# Patient Record
Sex: Female | Born: 1965 | Race: White | Hispanic: No | Smoking: Former smoker
Health system: Southern US, Community
[De-identification: ages and names within clinical notes are randomized; demographics above are authoritative.]

## PROBLEM LIST (undated history)

## (undated) DIAGNOSIS — M341 CR(E)ST syndrome: Secondary | ICD-10-CM

## (undated) DIAGNOSIS — J302 Other seasonal allergic rhinitis: Secondary | ICD-10-CM

## (undated) DIAGNOSIS — Z86010 Personal history of colonic polyps: Secondary | ICD-10-CM

## (undated) DIAGNOSIS — M47812 Spondylosis without myelopathy or radiculopathy, cervical region: Secondary | ICD-10-CM

## (undated) DIAGNOSIS — K222 Esophageal obstruction: Secondary | ICD-10-CM

## (undated) DIAGNOSIS — I7300 Raynaud's syndrome without gangrene: Secondary | ICD-10-CM

## (undated) DIAGNOSIS — K219 Gastro-esophageal reflux disease without esophagitis: Secondary | ICD-10-CM

## (undated) DIAGNOSIS — K589 Irritable bowel syndrome without diarrhea: Secondary | ICD-10-CM

## (undated) DIAGNOSIS — Z720 Tobacco use: Secondary | ICD-10-CM

## (undated) DIAGNOSIS — E538 Deficiency of other specified B group vitamins: Secondary | ICD-10-CM

## (undated) DIAGNOSIS — B009 Herpesviral infection, unspecified: Secondary | ICD-10-CM

## (undated) DIAGNOSIS — M4800 Spinal stenosis, site unspecified: Secondary | ICD-10-CM

## (undated) DIAGNOSIS — I10 Essential (primary) hypertension: Secondary | ICD-10-CM

## (undated) HISTORY — DX: Raynaud's syndrome without gangrene: I73.00

## (undated) HISTORY — DX: Tobacco use: Z72.0

## (undated) HISTORY — DX: Cr(e)st syndrome: M34.1

## (undated) HISTORY — DX: Gastro-esophageal reflux disease without esophagitis: K21.9

## (undated) HISTORY — PX: HEMORRHOID SURGERY: SHX153

## (undated) HISTORY — DX: Irritable bowel syndrome, unspecified: K58.9

## (undated) HISTORY — PX: COLONOSCOPY: SHX174

## (undated) HISTORY — PX: OOPHORECTOMY: SHX86

## (undated) HISTORY — DX: Personal history of colonic polyps: Z86.010

## (undated) HISTORY — PX: DERMOID CYST  EXCISION: SHX1452

## (undated) HISTORY — DX: Spondylosis without myelopathy or radiculopathy, cervical region: M47.812

## (undated) HISTORY — DX: Essential (primary) hypertension: I10

## (undated) HISTORY — PX: BREAST SURGERY: SHX581

## (undated) HISTORY — PX: CERVICAL FUSION: SHX112

## (undated) HISTORY — DX: Esophageal obstruction: K22.2

---
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
## 1998-04-23 ENCOUNTER — Emergency Department (HOSPITAL_COMMUNITY): Admission: EM | Admit: 1998-04-23 | Discharge: 1998-04-23 | Payer: Self-pay | Admitting: Emergency Medicine

## 1999-11-10 ENCOUNTER — Other Ambulatory Visit: Admission: RE | Admit: 1999-11-10 | Discharge: 1999-11-10 | Payer: Self-pay | Admitting: *Deleted

## 2000-08-19 ENCOUNTER — Encounter: Payer: Self-pay | Admitting: Internal Medicine

## 2000-08-19 ENCOUNTER — Encounter: Admission: RE | Admit: 2000-08-19 | Discharge: 2000-08-19 | Payer: Self-pay | Admitting: Internal Medicine

## 2003-08-21 ENCOUNTER — Ambulatory Visit (HOSPITAL_COMMUNITY): Admission: RE | Admit: 2003-08-21 | Discharge: 2003-08-21 | Payer: Self-pay | Admitting: Endocrinology

## 2004-07-24 ENCOUNTER — Encounter: Admission: RE | Admit: 2004-07-24 | Discharge: 2004-07-24 | Payer: Self-pay | Admitting: Obstetrics and Gynecology

## 2004-09-11 ENCOUNTER — Ambulatory Visit (HOSPITAL_COMMUNITY): Admission: RE | Admit: 2004-09-11 | Discharge: 2004-09-11 | Payer: Self-pay | Admitting: Gastroenterology

## 2004-09-11 ENCOUNTER — Encounter (INDEPENDENT_AMBULATORY_CARE_PROVIDER_SITE_OTHER): Payer: Self-pay | Admitting: Specialist

## 2005-01-14 ENCOUNTER — Encounter: Admission: RE | Admit: 2005-01-14 | Discharge: 2005-01-14 | Payer: Self-pay | Admitting: Obstetrics and Gynecology

## 2005-02-22 ENCOUNTER — Encounter (INDEPENDENT_AMBULATORY_CARE_PROVIDER_SITE_OTHER): Payer: Self-pay | Admitting: Specialist

## 2005-02-22 ENCOUNTER — Ambulatory Visit (HOSPITAL_COMMUNITY): Admission: RE | Admit: 2005-02-22 | Discharge: 2005-02-22 | Payer: Self-pay | Admitting: General Surgery

## 2005-09-06 ENCOUNTER — Encounter (INDEPENDENT_AMBULATORY_CARE_PROVIDER_SITE_OTHER): Payer: Self-pay | Admitting: Specialist

## 2005-09-06 ENCOUNTER — Ambulatory Visit (HOSPITAL_BASED_OUTPATIENT_CLINIC_OR_DEPARTMENT_OTHER): Admission: RE | Admit: 2005-09-06 | Discharge: 2005-09-06 | Payer: Self-pay | Admitting: General Surgery

## 2005-11-11 ENCOUNTER — Encounter (INDEPENDENT_AMBULATORY_CARE_PROVIDER_SITE_OTHER): Payer: Self-pay | Admitting: *Deleted

## 2005-11-11 ENCOUNTER — Ambulatory Visit (HOSPITAL_COMMUNITY): Admission: RE | Admit: 2005-11-11 | Discharge: 2005-11-12 | Payer: Self-pay | Admitting: Obstetrics and Gynecology

## 2005-12-03 ENCOUNTER — Emergency Department (HOSPITAL_COMMUNITY): Admission: EM | Admit: 2005-12-03 | Discharge: 2005-12-03 | Payer: Self-pay | Admitting: *Deleted

## 2006-08-08 ENCOUNTER — Encounter: Admission: RE | Admit: 2006-08-08 | Discharge: 2006-08-08 | Payer: Self-pay | Admitting: Obstetrics and Gynecology

## 2006-09-06 ENCOUNTER — Encounter (INDEPENDENT_AMBULATORY_CARE_PROVIDER_SITE_OTHER): Payer: Self-pay | Admitting: General Surgery

## 2006-09-06 ENCOUNTER — Ambulatory Visit (HOSPITAL_BASED_OUTPATIENT_CLINIC_OR_DEPARTMENT_OTHER): Admission: RE | Admit: 2006-09-06 | Discharge: 2006-09-06 | Payer: Self-pay | Admitting: General Surgery

## 2008-01-04 LAB — CONVERTED CEMR LAB

## 2008-01-30 ENCOUNTER — Encounter: Admission: RE | Admit: 2008-01-30 | Discharge: 2008-01-30 | Payer: Self-pay | Admitting: Obstetrics and Gynecology

## 2008-02-12 ENCOUNTER — Ambulatory Visit: Payer: Self-pay | Admitting: Internal Medicine

## 2008-02-12 DIAGNOSIS — I10 Essential (primary) hypertension: Secondary | ICD-10-CM | POA: Insufficient documentation

## 2008-02-12 LAB — CONVERTED CEMR LAB
AST: 36 units/L (ref 0–37)
Albumin: 4.2 g/dL (ref 3.5–5.2)
Alkaline Phosphatase: 47 units/L (ref 39–117)
Basophils Absolute: 0 10*3/uL (ref 0.0–0.1)
Basophils Relative: 0.1 % (ref 0.0–3.0)
Chloride: 103 meq/L (ref 96–112)
Crystals: NEGATIVE
Eosinophils Absolute: 0 10*3/uL (ref 0.0–0.7)
Eosinophils Relative: 0.6 % (ref 0.0–5.0)
Folate: 4.5 ng/mL
GFR calc Af Amer: 117 mL/min
GFR calc non Af Amer: 97 mL/min
Ketones, ur: 15 mg/dL — AB
Leukocytes, UA: NEGATIVE
Lymphocytes Relative: 14.4 % (ref 12.0–46.0)
MCV: 99.4 fL (ref 78.0–100.0)
Monocytes Relative: 1.8 % — ABNORMAL LOW (ref 3.0–12.0)
Neutrophils Relative %: 83.1 % — ABNORMAL HIGH (ref 43.0–77.0)
Nitrite: NEGATIVE
Potassium: 3.5 meq/L (ref 3.5–5.1)
RBC / HPF: NONE SEEN
RBC: 3.87 M/uL (ref 3.87–5.11)
Sodium: 137 meq/L (ref 135–145)
Total Bilirubin: 0.7 mg/dL (ref 0.3–1.2)
Transferrin: 201.6 mg/dL — ABNORMAL LOW (ref >=212.0)
Urine Glucose: >=1000 mg/dL — CR
Vitamin B-12: 147 pg/mL — ABNORMAL LOW (ref 211–911)
WBC: 8.3 10*3/uL (ref 4.5–10.5)

## 2008-02-13 ENCOUNTER — Telehealth (INDEPENDENT_AMBULATORY_CARE_PROVIDER_SITE_OTHER): Payer: Self-pay | Admitting: *Deleted

## 2008-02-14 ENCOUNTER — Encounter: Payer: Self-pay | Admitting: Internal Medicine

## 2008-02-21 ENCOUNTER — Ambulatory Visit: Payer: Self-pay | Admitting: Internal Medicine

## 2008-02-21 DIAGNOSIS — E538 Deficiency of other specified B group vitamins: Secondary | ICD-10-CM | POA: Insufficient documentation

## 2008-02-21 DIAGNOSIS — G63 Polyneuropathy in diseases classified elsewhere: Secondary | ICD-10-CM

## 2008-03-05 ENCOUNTER — Encounter (INDEPENDENT_AMBULATORY_CARE_PROVIDER_SITE_OTHER): Payer: Self-pay | Admitting: General Surgery

## 2008-03-05 ENCOUNTER — Ambulatory Visit (HOSPITAL_BASED_OUTPATIENT_CLINIC_OR_DEPARTMENT_OTHER): Admission: RE | Admit: 2008-03-05 | Discharge: 2008-03-05 | Payer: Self-pay | Admitting: General Surgery

## 2008-03-14 ENCOUNTER — Ambulatory Visit: Payer: Self-pay | Admitting: Internal Medicine

## 2008-03-14 DIAGNOSIS — R739 Hyperglycemia, unspecified: Secondary | ICD-10-CM | POA: Insufficient documentation

## 2008-03-15 ENCOUNTER — Encounter: Payer: Self-pay | Admitting: Internal Medicine

## 2008-07-15 ENCOUNTER — Telehealth: Payer: Self-pay | Admitting: Internal Medicine

## 2008-12-26 ENCOUNTER — Ambulatory Visit: Payer: Self-pay | Admitting: Internal Medicine

## 2008-12-26 LAB — CONVERTED CEMR LAB
Basophils Absolute: 0 10*3/uL (ref 0.0–0.1)
CO2: 31 meq/L (ref 19–32)
Chloride: 98 meq/L (ref 96–112)
Eosinophils Absolute: 0.1 10*3/uL (ref 0.0–0.7)
Eosinophils Relative: 0.8 % (ref 0.0–5.0)
Folate: 8.5 ng/mL
GFR calc non Af Amer: 115.19 mL/min (ref >60)
HCT: 37.1 % (ref 36.0–46.0)
Lymphs Abs: 1.6 10*3/uL (ref 0.7–4.0)
MCHC: 34.4 g/dL (ref 30.0–36.0)
MCV: 104 fL — ABNORMAL HIGH (ref 78.0–100.0)
Neutro Abs: 8.5 10*3/uL — ABNORMAL HIGH (ref 1.4–7.7)
Neutrophils Relative %: 78.4 % — ABNORMAL HIGH (ref 43.0–77.0)
Platelets: 158 10*3/uL (ref 150.0–400.0)
RBC: 3.56 M/uL — ABNORMAL LOW (ref 3.87–5.11)
Sodium: 137 meq/L (ref 135–145)
Transferrin: 219.3 mg/dL (ref 212.0–360.0)
Vitamin B-12: 301 pg/mL (ref 211–911)

## 2009-01-05 ENCOUNTER — Encounter: Payer: Self-pay | Admitting: Internal Medicine

## 2009-04-16 ENCOUNTER — Encounter: Payer: Self-pay | Admitting: Internal Medicine

## 2009-04-21 ENCOUNTER — Encounter: Admission: RE | Admit: 2009-04-21 | Discharge: 2009-04-21 | Payer: Self-pay | Admitting: Orthopedic Surgery

## 2009-12-31 ENCOUNTER — Ambulatory Visit: Payer: Self-pay | Admitting: Internal Medicine

## 2009-12-31 DIAGNOSIS — K219 Gastro-esophageal reflux disease without esophagitis: Secondary | ICD-10-CM | POA: Insufficient documentation

## 2009-12-31 DIAGNOSIS — F17200 Nicotine dependence, unspecified, uncomplicated: Secondary | ICD-10-CM | POA: Insufficient documentation

## 2009-12-31 LAB — CONVERTED CEMR LAB
ALT: 30 units/L (ref 0–35)
Basophils Absolute: 0.1 10*3/uL (ref 0.0–0.1)
Basophils Relative: 1.1 % (ref 0.0–3.0)
Bilirubin, Direct: 0.1 mg/dL (ref 0.0–0.3)
CO2: 32 meq/L (ref 19–32)
Calcium: 9.8 mg/dL (ref 8.4–10.5)
Folate: 3.8 ng/mL
GFR calc non Af Amer: 95.98 mL/min (ref >60)
HCT: 35.3 % — ABNORMAL LOW (ref 36.0–46.0)
Hemoglobin: 12.3 g/dL (ref 12.0–15.0)
Hgb A1c MFr Bld: 5.7 % (ref 4.6–6.5)
Lymphs Abs: 1.3 10*3/uL (ref 0.7–4.0)
MCV: 103.7 fL — ABNORMAL HIGH (ref 78.0–100.0)
Monocytes Absolute: 0.5 10*3/uL (ref 0.1–1.0)
Neutro Abs: 6 10*3/uL (ref 1.4–7.7)
Neutrophils Relative %: 75.2 % (ref 43.0–77.0)
RBC: 3.41 M/uL — ABNORMAL LOW (ref 3.87–5.11)
Sodium: 139 meq/L (ref 135–145)
Total Bilirubin: 0.6 mg/dL (ref 0.3–1.2)
Vitamin B-12: 235 pg/mL (ref 211–911)

## 2010-01-01 ENCOUNTER — Telehealth (INDEPENDENT_AMBULATORY_CARE_PROVIDER_SITE_OTHER): Payer: Self-pay | Admitting: *Deleted

## 2010-01-01 ENCOUNTER — Encounter: Payer: Self-pay | Admitting: Internal Medicine

## 2010-01-02 ENCOUNTER — Telehealth: Payer: Self-pay | Admitting: Internal Medicine

## 2010-01-04 ENCOUNTER — Encounter: Payer: Self-pay | Admitting: Internal Medicine

## 2010-01-04 ENCOUNTER — Telehealth: Payer: Self-pay | Admitting: Internal Medicine

## 2010-04-05 HISTORY — PX: OTHER SURGICAL HISTORY: SHX169

## 2010-05-07 NOTE — Letter (Signed)
Summary: LT foot mass/[facility]   LT foot mass/[facility]   Imported By: Sherian Rein 04/23/2009 07:54:50  _____________________________________________________________________  External Attachment:    Type:   Image     Comment:   External Document

## 2010-05-07 NOTE — Medication Information (Signed)
Summary: Prior autho for Cozaar/Coventry  Prior autho for Cozaar/Coventry   Imported By: Sherian Rein 01/06/2010 10:24:42  _____________________________________________________________________  External Attachment:    Type:   Image     Comment:   External Document

## 2010-05-07 NOTE — Letter (Signed)
Summary: Results Follow-up Letter  [facility]  [address] [place], [place] [address]   Phone: [phone]  Fax: [phone]    01/01/2010  [address] [place], [place]  [address]  Dear Ms. [name],   The following are the results of your recent test(s):  Test     Result     B12       borderline low CBC       mild anemia Liver       one,very slight enzyme elevation Kidney     normal Blood sugar     normal   _________________________________________________________  Please call for an appointment as directed _________________________________________________________ _________________________________________________________ _________________________________________________________  Sincerely,  Sanda Linger MD [facility]

## 2010-05-07 NOTE — Assessment & Plan Note (Signed)
Summary: FU---STC   Vital Signs:  Patient profile:   45 year old female Height:      70 inches Weight:      119 pounds BMI:     17.14 O2 Sat:      94 % on Room air Temp:     98.9 degrees F oral Pulse rate:   94 / minute Pulse rhythm:   regular Resp:     16 per minute BP sitting:   138 / 70  (left arm) Cuff size:   regular  Vitals Entered By: Alysia Penna (December 31, 2009 3:43 PM)  O2 Flow:  Room air CC: pt here for f/u for B12, hypertension, and prescription refills. /cp sma, Heartburn, Hypertension Management   Primary Care Basheer Molchan:  Etta Grandchild MD  CC:  pt here for f/u for B12, hypertension, and prescription refills. /cp sma, Heartburn, and Hypertension Management.  History of Present Illness:  Heartburn      This is a 45 year old woman who presents with Heartburn.  The symptoms began >1 year ago.  The intensity is described as moderate.  The patient reports acid reflux and sour taste in mouth, but denies epigastric pain, chest pain, trouble swallowing, weight loss, and weight gain.  The patient denies the following alarm features: melena, dysphagia, hematemesis, vomiting, involuntary weight loss >5%, and history of anemia.  Symptoms are worse with alcohol, spicy foods, citrus, and NSAIDs.  Prior evaluation has included no diagnostic studies.  Treatment that was tried and either found to be ineffective or stopped due to problems include dietary changes, weight loss, an antacid, and an H2 blocker.    Hypertension History:      She denies headache, chest pain, palpitations, dyspnea with exertion, orthopnea, PND, peripheral edema, visual symptoms, neurologic problems, syncope, and side effects from treatment.  She notes no problems with any antihypertensive medication side effects.        Positive major cardiovascular risk factors include hypertension and current tobacco user.  Negative major cardiovascular risk factors include female age less than 65 years old, no  history of diabetes or hyperlipidemia, and negative family history for ischemic heart disease.        Further assessment for target organ damage reveals no history of ASHD, cardiac end-organ damage (CHF/LVH), stroke/TIA, peripheral vascular disease, renal insufficiency, or hypertensive retinopathy.     Preventive Screening-Counseling & Management  Alcohol-Tobacco     Alcohol drinks/day: 2     Alcohol type: wine     >5/day in last 3 mos: no     Alcohol Counseling: not indicated; use of alcohol is not excessive or problematic     Feels need to cut down: no     Feels annoyed by complaints: no     Feels guilty re: drinking: no     Needs 'eye opener' in am: no     Smoking Status: current     Smoking Cessation Counseling: yes     Packs/Day: 1/2     Year Started: 1997     Cans of tobacco/week: no     Passive Smoke Exposure: no     Tobacco Counseling: to quit use of tobacco products  Hep-HIV-STD-Contraception     Hepatitis Risk: no risk noted     HIV Risk: no     HIV Risk Counseling: not indicated-no HIV risk noted     STD Risk: no risk noted      Sexual History:  currently monogamous.  Drug Use:  never.        Blood Transfusions:  no.    Medications Prior to Update: 1)  Azor 5-40 Mg Tabs (Amlodipine-Olmesartan) .... Once Daily 2)  Cyanocobalamin 1000 Mcg/ml Soln (Cyanocobalamin) .[name].. 1 Ml Im Once Mth 3)  3 Ml Syringe 27 Gauge 1/2 Inch .... Use Once Q Mth As Directed With B 12 4)  Zantac 5)  Advil  Current Medications (verified): 1)  Azor 5-40 Mg Tabs (Amlodipine-Olmesartan) .... Once Daily 2)  Dexilant 60 Mg Cpdr (Dexlansoprazole) .... One By Mouth Once Daily As Needed For Heartburn 3)  Nascobal 500 Mcg/0.77ml Soln (Cyanocobalamin) .... One Puff in A Nostril Once Daily  Allergies (verified): No Known Drug Allergies  Past History:  Past Medical History: Last updated: 02/12/2008 Hypertension  Past Surgical History: Last updated: 02/12/2008 Cervical  fusion Hemorrhoidectomy Lumpectomy Oophorectomy  Family History: Last updated: 02/12/2008 ALS (Mother) Family History Diabetes 1st degree relative Family History Hypertension Family History of Stroke F 1st degree relative <60  Social History: Last updated: 02/12/2008 Married Current Smoker Alcohol use-yes Drug use-no Regular exercise-yes  Risk Factors: Alcohol Use: 2 (12/31/2009) >5 drinks/d w/in last 3 months: no (12/31/2009) Exercise: yes (02/12/2008)  Risk Factors: Smoking Status: current (12/31/2009) Packs/Day: 1/2 (12/31/2009) Cans of tobacco/wk: no (12/31/2009) Passive Smoke Exposure: no (12/31/2009)  Family History: Reviewed history from 02/12/2008 and no changes required. ALS (Mother) Family History Diabetes 1st degree relative Family History Hypertension Family History of Stroke F 1st degree relative <60  Social History: Reviewed history from 02/12/2008 and no changes required. Married Current Smoker Alcohol use-yes Drug use-no Regular exercise-yes Hepatitis Risk:  no risk noted STD Risk:  no risk noted Sexual History:  currently monogamous Drug Use:  never Blood Transfusions:  no  Review of Systems       The patient complains of severe indigestion/heartburn.  The patient denies anorexia, fever, weight loss, weight gain, chest pain, syncope, dyspnea on exertion, peripheral edema, prolonged cough, headaches, hemoptysis, abdominal pain, melena, hematochezia, hematuria, suspicious skin lesions, depression, unusual weight change, abnormal bleeding, enlarged lymph nodes, angioedema, and breast masses.   Endo:  Denies cold intolerance, excessive hunger, excessive thirst, excessive urination, heat intolerance, polyuria, and weight change. Heme:  Denies abnormal bruising, bleeding, enlarge lymph nodes, fevers, pallor, and skin discoloration.  Physical Exam  General:  alert, well-developed, well-nourished, well-hydrated, and appropriate dress.   Head:   normocephalic, atraumatic, no abnormalities observed, and no abnormalities palpated.   Eyes:  vision grossly intact, pupils equal, pupils round, and pupils reactive to light.   Mouth:  Oral mucosa and oropharynx without lesions or exudates.  Teeth in good repair. Neck:  supple, full ROM, no masses, no thyromegaly, no thyroid nodules or tenderness, no JVD, normal carotid upstroke, no carotid bruits, no cervical lymphadenopathy, and no neck tenderness.   Lungs:  normal respiratory effort, no intercostal retractions, no accessory muscle use, normal breath sounds, no dullness, no fremitus, no crackles, and no wheezes.   Heart:  normal rate, regular rhythm, no murmur, no gallop, no rub, and no JVD.   Abdomen:  soft, non-tender, normal bowel sounds, no distention, no masses, no guarding, no rigidity, no rebound tenderness, no abdominal hernia, no inguinal hernia, no hepatomegaly, and no splenomegaly.   Msk:  No deformity or scoliosis noted of thoracic or lumbar spine.   Pulses:  R and L carotid,radial,femoral,dorsalis pedis and posterior tibial pulses are full and equal bilaterally Extremities:  No clubbing, cyanosis, edema, or deformity noted with normal  full range of motion of all joints.   Neurologic:  No cranial nerve deficits noted. Station and gait are normal. Plantar reflexes are down-going bilaterally. DTRs are symmetrical throughout. Sensory, motor and coordinative functions appear intact. Skin:  turgor normal, color normal, no rashes, no ulcerations, and no edema.  excessive tan.   Cervical Nodes:  no anterior cervical adenopathy and no posterior cervical adenopathy.   Axillary Nodes:  no R axillary adenopathy and no L axillary adenopathy.   Inguinal Nodes:  no R inguinal adenopathy and no L inguinal adenopathy.   Psych:  Oriented X3, memory intact for recent and remote, normally interactive, good eye contact, not anxious appearing, not depressed appearing, not agitated, not suicidal, and not  homicidal.     Impression & Recommendations:  Problem # 1:  GERD (ICD-530.81) Assessment New  Her updated medication list for this problem includes:    Dexilant 60 Mg Cpdr (Dexlansoprazole) ..... One by mouth once daily as needed for heartburn  Labs Reviewed: Hgb: 12.8 (12/26/2008)   Hct: 37.1 (12/26/2008)  Orders: Tobacco use cessation intermediate 3-10 minutes ([address])  Problem # 2:  TOBACCO USE (ICD-305.1) Assessment: New  Encouraged smoking cessation and discussed different methods for smoking cessation.   Orders: Tobacco use cessation intermediate 3-10 minutes ([address])  Problem # 3:  HYPERGLYCEMIA, MILD (ICD-790.29) Assessment: Unchanged  Orders: Venipuncture ([address]) TLB-BMP (Basic Metabolic Panel-BMET) (80048-METABOL) TLB-CBC Platelet - w/Differential (85025-CBCD) TLB-Hepatic/Liver Function Pnl (80076-HEPATIC) TLB-TSH (Thyroid Stimulating Hormone) (84443-TSH) TLB-B12 + Folate Pnl (09811_91478-G95/AOZ) TLB-A1C / Hgb A1C (Glycohemoglobin) (83036-A1C)  Problem # 4:  VITAMIN B12 DEFICIENCY (ICD-266.2) Assessment: Unchanged  Orders: Venipuncture ([address]) TLB-BMP (Basic Metabolic Panel-BMET) (80048-METABOL) TLB-CBC Platelet - w/Differential (85025-CBCD) TLB-Hepatic/Liver Function Pnl (80076-HEPATIC) TLB-TSH (Thyroid Stimulating Hormone) (84443-TSH) TLB-B12 + Folate Pnl (78469_62952-W41/LKG) TLB-A1C / Hgb A1C (Glycohemoglobin) (83036-A1C)  Problem # 5:  HYPERTENSION (ICD-401.9) Assessment: Improved  Her updated medication list for this problem includes:    Azor 5-40 Mg Tabs (Amlodipine-olmesartan) ..... Once daily  Orders: Venipuncture ([address]) TLB-BMP (Basic Metabolic Panel-BMET) (80048-METABOL) TLB-CBC Platelet - w/Differential (85025-CBCD) TLB-Hepatic/Liver Function Pnl (80076-HEPATIC) TLB-TSH (Thyroid Stimulating Hormone) (84443-TSH) TLB-B12 + Folate Pnl (72536_64403-K74/QVZ) TLB-A1C / Hgb A1C (Glycohemoglobin) (83036-A1C) Tobacco use cessation  intermediate 3-10 minutes ([address])  BP today: 138/70 Prior BP: 140/86 (12/26/2008)  Prior 10 Yr Risk Heart Disease: Not enough information (02/12/2008)  Labs Reviewed: K+: 4.0 (12/26/2008) Creat: : 0.6 (12/26/2008)     Complete Medication List: 1)  Azor 5-40 Mg Tabs (Amlodipine-olmesartan) .... Once daily 2)  Dexilant 60 Mg Cpdr (Dexlansoprazole) .... One by mouth once daily as needed for heartburn 3)  Nascobal 500 Mcg/0.64ml Soln (Cyanocobalamin) .... One puff in a nostril once daily  Hypertension Assessment/Plan:      The patient's hypertensive risk group is category B: At least one risk factor (excluding diabetes) with no target organ damage.  Today's blood pressure is 138/70.  Her blood pressure goal is < 140/90.  Patient Instructions: 1)  Please schedule a follow-up appointment in 4 months. 2)  Tobacco is very bad for your health and your loved ones! You Should stop smoking!. 3)  Stop Smoking Tips: Choose a Quit date. Cut down before the Quit date. decide what you will do as a substitute when you feel the urge to smoke(gum,toothpick,exercise). 4)  Check your Blood Pressure regularly. If it is above 140/90: you should make an appointment. Prescriptions: NASCOBAL 500 MCG/0.1ML SOLN (CYANOCOBALAMIN) one puff in a nostril once daily  #3 bottles x 0   Entered and Authorized  by:   Etta Grandchild MD   Signed by:   Etta Grandchild MD on 12/31/2009   Method used:   Samples Given   RxID:   [id number] DEXILANT 60 MG CPDR (DEXLANSOPRAZOLE) One by mouth once daily as needed for heartburn  #30 x 0   Entered and Authorized by:   Etta Grandchild MD   Signed by:   Etta Grandchild MD on 12/31/2009   Method used:   Samples Given   RxID:   [id number] AZOR 5-40 MG TABS (AMLODIPINE-OLMESARTAN) once daily  #30 x 11   Entered and Authorized by:   Etta Grandchild MD   Signed by:   Etta Grandchild MD on 12/31/2009   Method used:   Electronically to        [facility]  [phone]*  (retail)       [address] [place], [place]  [address]       Ph: [phone] or [phone]       Fax: [phone]   RxID:   [id number]

## 2010-05-07 NOTE — Progress Notes (Signed)
  Phone Note Call from Patient Call back at Home Phone [phone] Call back at Work Phone [phone]   Caller: Patient Summary of Call: Patient called to check stauts of rx for B12 spray and Azor due to current insurance termination tonight. She states that these are to be called to [facility] on [address]. Returned call to patient to advise of pending PA for Azor and pt stated to not go thru thr process and she will just provide the new insurance info once she has it. Rx for b12 and dexilant [facility] [address].Marland KitchenMarland KitchenAlvy Beal Archie CMA  January 02, 2010 2:07 PM  Initial call taken by: Rock Nephew CMA,  January 02, 2010 1:59 PM    Prescriptions: DEXILANT 60 MG CPDR (DEXLANSOPRAZOLE) One by mouth once daily as needed for heartburn  #30 x 0   Entered by:   Rock Nephew CMA   Authorized by:   Etta Grandchild MD   Signed by:   Rock Nephew CMA on 01/02/2010   Method used:   Electronically to        [facility] [phone]* (retail)       [address]       [address]       [place], [place]  [address]       Ph: [phone]       Fax: [phone]   RxID:   [id number] NASCOBAL 500 MCG/0.1ML SOLN (CYANOCOBALAMIN) one puff in a nostril once daily  #12month x 3   Entered by:   Rock Nephew CMA   Authorized by:   Etta Grandchild MD   Signed by:   Rock Nephew CMA on 01/02/2010   Method used:   Electronically to        [facility] [phone]* (retail)       [address]       [address]       [place], [place]  [address]       Ph: [phone]       Fax: [phone]   RxID:   [id number]

## 2010-05-07 NOTE — Progress Notes (Signed)
Summary: PA-Azor  Phone Note From Pharmacy   Summary of Call: PA-Azor, called [facility] @ [phone], awaiting form. Dagoberto Reef  January 01, 2010 4:31 PM   Follow-up for Phone Call        Spoke with pt ( see other phone note). Patient insurance will switch tonight. She advised not to complete prior auth.She will just wait to see what new insurance will cover.Alvy Beal Archie CMA  January 02, 2010 2:12 PM

## 2010-05-07 NOTE — Medication Information (Signed)
Summary: Prior autho for Nexium/[employer]  Prior autho for Nexium/[employer]   Imported By: Sherian Rein 01/06/2010 10:25:49  _____________________________________________________________________  External Attachment:    Type:   Image     Comment:   External Document

## 2010-05-07 NOTE — Progress Notes (Signed)
  Phone Note Outgoing Call   Summary of Call: [place]:  Her insurance co does not cover Dexilant or Azor so they are being changed to Nexium and Cozaar respectively Initial call taken by: Etta Grandchild MD,  January 04, 2010 12:09 PM  Follow-up for Phone Call        Called patient Follow-up by: Etta Grandchild MD,  January 04, 2010 12:08 PM  Additional Follow-up for Phone Call Additional follow up Details #1::        Called patient x 2, closing phone note until pt calls back.Marland KitchenMarland KitchenAlvy Beal Archie CMA  January 09, 2010 1:33 PM     New/Updated Medications: NEXIUM 40 MG CPDR (ESOMEPRAZOLE MAGNESIUM) One by mouth once daily COZAAR 100 MG TABS (LOSARTAN POTASSIUM) One by mouth once daily for high blood pressure Prescriptions: COZAAR 100 MG TABS (LOSARTAN POTASSIUM) One by mouth once daily for high blood pressure  #30 x 11   Entered and Authorized by:   Etta Grandchild MD   Signed by:   Etta Grandchild MD on 01/04/2010   Method used:   [facility]  [address]  [phone]* (retail)       [address] [place], [place]  [address]       Ph: [phone] or [phone]       Fax: [phone]   RxID:   [id number] NEXIUM 40 MG CPDR (ESOMEPRAZOLE MAGNESIUM) One by mouth once daily  #30 x 11   Entered and Authorized by:   Etta Grandchild MD   Signed by:   Etta Grandchild MD on 01/04/2010   Method used:   [facility]        [facility]  [phone]* (retail)       [address] [place], [place]  [address]       Ph: [phone] or [phone]       Fax: [phone]   RxID:   [id number]

## 2010-08-18 NOTE — Op Note (Signed)
NAME:  Lisa Long, Lisa Long              ACCOUNT NO.:  [id number]   MEDICAL RECORD NO.:  [id number]          PATIENT TYPE:  AMB   LOCATION:  DSC                          FACILITY:  [facility]   PHYSICIAN:  Ollen Gross. Vernell Morgans, M.D. DATE OF BIRTH:  [birth year]   DATE OF PROCEDURE:  09/06/2006  DATE OF DISCHARGE:                               OPERATIVE REPORT   PREOPERATIVE DIAGNOSIS:  Right breast mass.   POSTOPERATIVE DIAGNOSIS:  Right breast mass.   OPERATION/PROCEDURE:  Excision of right breast mass.   SURGEON:  Ollen Gross. Vernell Morgans, M.D.   ANESTHESIA:  General via LMA.   PROCEDURE:  After informed consent was obtained, the patient was brought  to the operating room, placed in supine position on the operating table.  After adequate induction of general anesthesia, the patient's right  breast was prepped with Betadine and draped in usual sterile manner.  The mass appeared to be very well circumscribed in the subcutaneous  tissue.  It was at the site of her old excision of a cyst, but it was  pretty much right under the nipple and areola on the right breast.  A  small incision was made with 15 blade knife along the patient's old  incision.  This was taken down just below the skin into the subcutaneous  tissue.  The mass was then excised from the rest of the subcutaneous  tissue sharply with Metzenbaum scissors and a 15 blade knife until the  mass was completely removed from the cavity.  It was adherent to the  nipple and areola and had to be gently excised away from the skin of the  nipple and this was done very carefully with 15 blade knife.  Once the  mass was removed, it was sent to pathology for further evaluation.  Hemostasis was achieved using Bovie electrocautery, but care was taken  not to Bovie the skin of the nipple or areola much at all.  The wound  was then infiltrated 0.25% Marcaine.  The incision was then closed with  a running 4-0 Monocryl subcuticular stitch and Dermabond dressing  was  applied and when the Dermabond was dry, some 4x4 gauze was placed over  the nipple areola and incision.  The patient tolerated the procedure  well.  At the end of the case, all needle, sponge and instruments counts  were correct.  The patient was then awakened, taken recovery in stable  condition.      Ollen Gross. Vernell Morgans, M.D.  Electronically Signed     PST/MEDQ  D:  09/06/2006  T:  09/06/2006  Job:  [id number]

## 2010-08-18 NOTE — Op Note (Signed)
NAMEMarland Kitchen  Lisa Long, Lisa Long              ACCOUNT NO.:  [id number]   MEDICAL RECORD NO.:  [id number]          PATIENT TYPE:  AMB   LOCATION:  DSC                          FACILITY:  [facility]   PHYSICIAN:  Ollen Gross. Vernell Morgans, M.D. DATE OF BIRTH:  07/30/1965   DATE OF PROCEDURE:  03/05/2008  DATE OF DISCHARGE:                               OPERATIVE REPORT   PREOPERATIVE DIAGNOSIS:  Recurrent right breast epidermoid cyst.   POSTOPERATIVE DIAGNOSIS:  Recurrent right breast epidermoid cyst.   PROCEDURE:  Excision of right breast cyst.   SURGEON:  Ollen Gross. Vernell Morgans, MD   ANESTHESIA:  General via LMA.   PROCEDURE IN DETAIL:  After informed consent was obtained, the patient  was brought to the operating room and placed in the supine position on  the operating table.  After adequate induction of general anesthesia,  the patient's right breast was prepped with Betadine and draped in usual  sterile manner.  A curvilinear incision was made at the edge of the  areola medially.  This was made in an elliptical fashion to excise the  previous scar in case the cyst was arising from the old scar.  This  incision was then carried down through the skin and subcutaneous tissue  sharply with a 15 blade knife.  The cavity of the cyst was entered and  some whitish material was evacuated.  The wall of the cyst was very  close to the underside of the nipple.  We actually shaved the wall of  the cyst away from the nipple with a 15 blade knife.  While doing this,  we made a small pin-hole opening on the nipple which was repaired from  the inside with a 5-0 Monocryl stitch.  Once the complete cyst was  removed, it was sent to pathology for further evaluation.  Hemostasis  was achieved using the Bovie electrocautery.  The skin was infiltrated  with some plain 0.25% Marcaine.  The wound was irrigated with copious  amounts of saline.  The skin incision was then reapproximated with  interrupted 5-0 Monocryl subcuticular  stitches.  Dermabond dressing was  then applied.  The patient tolerated the procedure well.  At the end of  the case, all needle, sponge, and instrument counts were correct.  The  patient was then awakened and taken to the recovery room in stable  condition.      Ollen Gross. Vernell Morgans, M.D.  Electronically Signed     PST/MEDQ  D:  03/05/2008  T:  03/06/2008  Job:  [id number]

## 2010-08-21 NOTE — Op Note (Signed)
NAME:  [name], Lisa Long              ACCOUNT NO.:  [id number]   MEDICAL RECORD NO.:  [id number]          PATIENT TYPE:  AMB   LOCATION:  DSC                          FACILITY:  [facility]   PHYSICIAN:  Ollen Gross. Vernell Morgans, M.D. DATE OF BIRTH:  July 17, 1965   DATE OF PROCEDURE:  09/06/2005  DATE OF DISCHARGE:                                 OPERATIVE REPORT   PREOPERATIVE DIAGNOSIS:  Right breast mass.   POSTOPERATIVE DIAGNOSIS:  Right breast mass.   PROCEDURE:  Excision of right breast mass approximately 2 cm in diameter.   SURGEON:  Ollen Gross. Carolynne Edouard, M.D.   ANESTHESIA:  General via LMA.   PROCEDURE:  After informed consent was obtained, the patient was brought to  the operating room and placed in the supine position on the operating table.  After induction of general anesthesia, the patient's right breast was  prepped with Betadine and draped in usual sterile manner.  The patient had a  palpable nodule in the subareolar area of the right breast just medial to  the nipple and superior.  A small curvilinear incision was made at the edge  of the areola in this area overlying the mass with 15 blade knife.  This  incision was carried down through the skin and around the mesh sharply with  tenotomy scissors.  The mass was well encapsulated.  Once it was removed, it  was sent to pathology for further evaluation.  Hemostasis was achieved using  the Bovie electrocautery.  The wound was then infiltrated with 0.25%  Marcaine.  Care was taken to avoid the implant.  The incision was then  closed with interrupted 4-0 Monocryl subcuticular stitches.  Benzoin, Steri-  Strips and sterile dressings were applied.  The patient tolerated the  procedure well. At the end of the case, all sponge, needle and instrument  counts were correct.  The patient was then awakened and taken to recovery in  stable condition.      Ollen Gross. Vernell Morgans, M.D.  Electronically Signed     PST/MEDQ  D:  09/06/2005  T:  09/07/2005   Job:  [id number]

## 2010-08-21 NOTE — Discharge Summary (Signed)
NAME:  Lisa Long, STUBER              ACCOUNT NO.:  [id number]   MEDICAL RECORD NO.:  [id number]          PATIENT TYPE:  OIB   LOCATION:  9304                          FACILITY:  [facility]   PHYSICIAN:  Guy Sandifer. Henderson Cloud, M.D. DATE OF BIRTH:  July 04, 1965   DATE OF ADMISSION:  11/11/2005  DATE OF DISCHARGE:  11/12/2005                                 DISCHARGE SUMMARY   ADMITTING DIAGNOSIS:  Right ovarian cyst.   DISCHARGE DIAGNOSIS:  Right and left ovarian cysts.   PROCEDURE:  On November 11, 2005 laparotomy with right salpingo-oophorectomy  and left ovarian cystectomy.   REASON FOR ADMISSION:  This patient is a 45 year old married white female  G0, P0 with a right ovarian cyst.  She is admitted for surgical management.   HOSPITAL COURSE:  She is admitted to the hospital, undergoes the above  procedure.  Estimated blood loss is less than 100 mL.  On the evening of  surgery she has good pain relief, stable vital signs with a clear urine  output.  On the day of discharge hemoglobin is 11, white count 12.2,  platelet count 139,000 and pathology is pending.  Vital signs are stable and  she is afebrile.  She is passing flatus, hungry, ambulating well, and wants  to go home.  Abdomen is flat and soft.  The dressing is clean and dry.   CONDITION ON DISCHARGE:  Good.   DIET:  Regular, as tolerated.   ACTIVITY:  No lifting.  No operation of automobiles.  No vaginal entry.  She  is to call the office for problems including, not limited to, temperature of  101 degrees, persistent nausea/vomiting, heavy vaginal bleeding, or  increasing pain.   MEDICATIONS:  1. Percocet 5/325 mg, #40, one to two p.o. q.6h. p.r.n.  2. Ibuprofen 600 mg q.6h. p.r.n.   Follow-up is in the office in three days for staple removal.      Guy Sandifer. Henderson Cloud, M.D.  Electronically Signed     JET/MEDQ  D:  11/12/2005  T:  11/12/2005  Job:  [id number]

## 2010-08-21 NOTE — Op Note (Signed)
NAME:  Lisa Long, Lisa Long              ACCOUNT NO.:  [id number]   MEDICAL RECORD NO.:  [id number]          PATIENT TYPE:  AMB   LOCATION:  DAY                          FACILITY:  [facility]   PHYSICIAN:  Ollen Gross. Vernell Morgans, M.D. DATE OF BIRTH:  December 10, 1965   DATE OF PROCEDURE:  02/22/2005  DATE OF DISCHARGE:                                 OPERATIVE REPORT   PREOPERATIVE DIAGNOSIS:  Internal and external hemorrhoids.   POSTOPERATIVE DIAGNOSIS:  Internal and external hemorrhoids.   PROCEDURE:  Hemorrhoidectomy and internal hemorrhoid banding x2.   SURGEON:  Dr. Carolynne Edouard   ANESTHESIA:  General endotracheal.   DESCRIPTION OF PROCEDURE:  After informed consent was obtained, the patient  was brought to the operating room, placed in a supine position on the  operating table.  After adequate induction of general anesthesia, the  patient was placed in lithotomy position, and her perirectal area was  prepped with Betadine and draped in the usual sterile manner.  The  perirectal regio was then infiltrated with 9 mL of 0.25% Marcaine with  epinephrine and 1 mL of Wydase, and the tissue was massaged gently for  several minutes.  Next, a bullet retractor was placed in the rectum.  The  rectum was examined.  The patient had a large internal and external  hemorrhoid complex in the right posterior position.  This hemorrhoid complex  was grasped with 2 Allis clamps.  The hemorrhoid was then scored at its base  with the 15 blade knife.  A hemostat was then placed across the base of this  hemorrhoid and clamped.  The hemorrhoid was then removed above the hemostat  with Metzenbaum scissors.  A 2-0 chromic stitch was used to reapproximate  the edges of the mucosa and skin.  The most outside 2-3 mm of the wound were  left open for drainage.  An interrupted figure-of-eight 3-0 chromic stitch  was used for hemostasis along the mid portion of the incision.  Once this  was accomplished, the incision was nicely  approximated and completely  hemostatic.  Next, the rest of the rectum was examined and in the left  posterior position, there was also a small internal hemorrhoid which was  able to be banded without difficulty.  A similar internal hemorrhoid was  also banded in the right anterior position.  The patient tolerated this  well.  The perirectal region was then infiltrated with more 0.25% Marcaine  with epinephrine.  Lidocaine jelly was inserted inside the rectum along with  a small piece of Gelfoam and sterile dressings were applied.  The patient  tolerated the procedure well.  At the end of the case, all needle, sponge,  and instrument counts were correct.  The patient was then awakened and taken  to recovery in stable condition.  The placement of the band was then  confirmed to be deep to the dentate line.      Ollen Gross. Vernell Morgans, M.D.  Electronically Signed     PST/MEDQ  D:  02/22/2005  T:  02/22/2005  Job:  [id number]

## 2010-08-21 NOTE — H&P (Signed)
NAME:  Lisa Long, Lisa Long              ACCOUNT NO.:  [id number]   MEDICAL RECORD NO.:  [id number]          PATIENT TYPE:  AMB   LOCATION:  SDC                           FACILITY:  [facility]   PHYSICIAN:  Guy Sandifer. Henderson Cloud, M.D. DATE OF BIRTH:  [birth year]   DATE OF ADMISSION:  11/11/2005  DATE OF DISCHARGE:                                HISTORY & PHYSICAL   CHIEF COMPLAINT:  Right ovarian cyst.   HISTORY OF PRESENT ILLNESS:  This patient is a 44 year old married, white  female, G0, P0, husband is status post vasectomy, who underwent abdominal CT  on Aug 10, 2005, which noted a 6 X 8 cm right pelvic mass.  It had  calcifications, fat and soft tissue, consistent with a dermoid.  Subsequent  ultrasound in my office on September 29, 2005, was consistent with a 9.4 cm  complex right ovarian mass, consistent with dermoid.  The left ovary had a  2.1 cm simple cyst.  Ovary appeared normal.  The patient has had increasing  intermittent right lower quadrant pain.  She has had no nausea or vomiting,  fever, chills or change in bowel habits.  After discussion of the options,  she is being admitted for laparotomy with right salpingo-oophorectomy.  The  left ovary will be removed only if distinctly abnormal.  The possibility of  left ovarian cystectomy has also been discussed.  Potential risks and  complications have been reviewed preoperatively.   PAST MEDICAL HISTORY:  Possible kidney stones.   PAST SURGICAL HISTORY:  1. Breast augmentation.  2. Hemorrhoidectomy.  3. C5-6 cervical spine fusion.  4. Cryotherapy.  5. Benign breast biopsy.   OBSTETRIC HISTORY:  Negative.   FAMILY HISTORY:  Positive for high blood pressure.   MEDICATIONS:  None.   ALLERGIES:  No known drug allergies.   SOCIAL HISTORY:  The patient is a smoker.  Denies alcohol or drug abuse.   REVIEW OF SYSTEMS:  NEUROLOGIC:  Negative for headache.  CARDIAC:  Denies  chest pain.  PULMONARY:  Denies shortness of breath.  GI:  Denies  recent  changes in bowel habits.   PHYSICAL EXAMINATION:  VITAL SIGNS:  Height 5 feet 8 inches, blood pressure  122/84.  HEENT:  Without thyromegaly.  LUNGS:  Clear to auscultation.  HEART:  Regular rate and rhythm.  BACK:  Without CVA tenderness.  BREASTS:  Without mass, retraction, discharge.  ABDOMEN:  Soft and nontender.  There is a fullness in the right lower  quadrant without a distinct mass.  PELVIC EXAM:  Vulva, vagina, cervix without lesion.  Uterus normal size,  mobile, nontender.  Left adnexa nontender, without masses.  Right adnexa  with 8-10 cm mildly tender, mobile mass that crosses the midline anteriorly.  EXTREMITIES:  Grossly within normal limits.  NEUROLOGICAL EXAM:  Grossly within normal limits.   ASSESSMENT:  Right ovarian cyst.   PLAN:  Laparotomy, right salpingo-oophorectomy, possible left ovarian  cystectomy.      Guy Sandifer Henderson Cloud, M.D.  Electronically Signed     JET/MEDQ  D:  11/09/2005  T:  11/09/2005  Job:  [id number]

## 2010-08-21 NOTE — Op Note (Signed)
NAME:  Lisa Long, Lisa Long              ACCOUNT NO.:  [id number]   MEDICAL RECORD NO.:  [id number]          PATIENT TYPE:  AMB   LOCATION:  ENDO                         FACILITY:  [facility]   PHYSICIAN:  Anselmo Rod, M.D.  DATE OF BIRTH:  [birth year]   DATE OF PROCEDURE:  09/11/2004  DATE OF DISCHARGE:                                 OPERATIVE REPORT   PROCEDURE PERFORMED:  Colonoscopy with multiple biopsies.   ENDOSCOPIST:  Anselmo Rod, M.D.   INSTRUMENT USED:  Olympus video colonoscope.   INDICATIONS FOR PROCEDURE:  A 45 year old white female with history of  severe diarrhea and rectal bleeding; rule out colonic polyps, masses,  hemorrhoids, IBD, etc.   PREPROCEDURE PREPARATION:  Informed consent was procured from the patient.  The patient fasted for eight hours prior to the procedure and prepped with a  bottle of magnesium citrate and a gallon of GoLYTELY the night prior to the  procedure.  Risks and benefits of the procedure including a 10% miss rate of  cancer and polyps was discussed with the patient as well.   PREPROCEDURE PHYSICAL:  VITAL SIGNS:  Stable vital signs.  NECK:  Supple.  CHEST:  Clear to auscultation.  CARDIOVASCULAR:  S1 and S2 regular.  ABDOMEN:  Soft with normal bowel sounds.   DESCRIPTION OF PROCEDURE:  The patient was placed in left lateral decubitus  position, sedated with 140 mg of Demerol and 10 mg of Versed in slow  incremental doses.  Once the patient was adequately sedated and maintained  on low flow oxygen and continuous cardiac monitoring, the Olympus video  colonoscope was advanced from the rectum to the cecum.  The patient had  significant abdominal discomfort with advancing the scope into the colon in  spite of high doses of medications prior to the procedure.  Prominent  internal hemorrhoids were seen on retroflexion which was suspected as the  source of the patient's rectal bleeding.  There were a few early sigmoid  diverticula.  The rest  of the exam to the terminal ileum was normal.  Random  colon biopsies were done to rule out microscopic versus collagenous colitis.  There was evidence of visceral hypersensitivity with insufflation of air  into the colon indicating a component of IBS.   IMPRESSION:  1.  Prominent internal hemorrhoids.  2.  Early sigmoid diverticulosis.  3.  Random colon biopsies done to rule out microscopic versus collagenous      colitis.  4.  Visceral hypersensitivity consistent with IBS.  5.  Normal terminal ileum.   RECOMMENDATIONS:  1.  Await pathology results.  2.  Further treatment depending on pathology results.  3.  Continue high fiber diet with liberal fluid intake.  4.  Avoid nonsteroidals for the next two weeks.  5.  Outpatient follow-up in the next two weeks for further recommendations.       JNM/MEDQ  D:  09/11/2004  T:  09/11/2004  Job:  [id number]   cc:   Richardean Sale, M.D.  [address]  [place]  [place] [address]  Fax: [phone]

## 2010-08-21 NOTE — Op Note (Signed)
NAME:  Lisa Long, Lisa Long              ACCOUNT NO.:  [id number]   MEDICAL RECORD NO.:  [id number]          PATIENT TYPE:  AMB   LOCATION:  SDC                           FACILITY:  [facility]   PHYSICIAN:  Guy Sandifer. Henderson Cloud, M.D. DATE OF BIRTH:  1965-07-08   DATE OF PROCEDURE:  11/11/2005  DATE OF DISCHARGE:                                 OPERATIVE REPORT   PREOPERATIVE DIAGNOSIS:  Right ovarian cyst.   POSTOPERATIVE DIAGNOSIS:  Right and left ovarian cysts.   PROCEDURE:  Exploratory laparotomy with right salpingo-oophorectomy and left  ovarian cystectomy.   SURGEON:  Guy Sandifer. Henderson Cloud, M.D.   ASSISTANTFreddy Finner, M.D.   ANESTHESIA:  General with endotracheal intubation.   ESTIMATED BLOOD LOSS:  Less than 100 cc.   SPECIMENS:  Right tube and ovary, left ovarian cyst wall, peritoneal  washings to pathology.   INDICATIONS AND CONSENT:  The patient is a 45 year old married white female,  G0, P0, husband status post vasectomy, who has a right ovarian cyst  measuring approximately 9.5 cm in diameter, consistent with a dermoid on CT,  ultrasound, and examination.  A 2.1-cm left simple cyst on the left ovary  was also noted on ultrasound.  After discussing the options, she is being  admitted for exploratory laparotomy, probable right salpingo-oophorectomy,  and left ovarian cystectomy.  Potential risks and complications had been  discussed preoperatively including but not limited to infection; bowel,  bladder, ureteral damage; bleeding requiring transfusion of blood products  and possible transfusion reaction; HIV and hepatitis acquisition; DVT; PE;  pneumonia; possibility of occurrence of dermoid in the left ovary at some  point in her life; hysterectomy.  All questions have been answered and  consent is signed on the chart.   FINDINGS:  The uterus is normal in size and contour.  Anterior and posterior  cul-de-sacs are normal.  The left ovary contains a few-centimeter smooth-  walled  cyst.  Left fallopian tube is normal.  Right fallopian tube is  normal.  Right ovary is approximately 9 cm in diameter with a smooth capsule  and mobile.   PROCEDURE:  The patient is taken to the operating room where she is  identified, placed in dorsal supine position, and general anesthesia is  induced via endotracheal intubation.  She is then prepped abdominally and  vaginally.  Foley catheter is placed in the bladder for drainage.  She is  draped in sterile fashion.  Pfannenstiel incision is made and dissection is  carried out in layers of the peritoneum which is bluntly entered and  extended superiorly and inferiorly.  Washings are then taken of the  abdominal peritoneal cavity.  The right ovary is then easily elevated into  the incision.  The bowel is packed away with a single pack.  The utero-  ovarian and infundibulopelvic ligament and right ovary are easily identified  and seen to be well clear of the ureter.  These are then cross clamped and  the specimen is cut free and sent to pathology.  Pedicle is then controlled  with 0 Monocryl sutures followed by a  free tie.  Good hemostasis is  obtained.  The left ovarian cyst is then incised and drains a small amount  of straw-colored serous fluid.  The cyst wall is stripped away and sent to  pathology.  Cautery is used to aid in hemostasis.  A 2-0 Monocryl suture is  then used to close the defect and good hemostasis is obtained.  This is then  wrapped in Intercede.  The pack is removed.  All counts are correct.  The  peritoneum is closed in running fashion with 0 Monocryl suture which is also  used to reapproximate the pyramidalis muscle in the midline.  Fascia is  closed with a running 0 PDS suture, starting at each angle and meeting in  the middle.  The skin is closed with clips.  All sponge, instrument, and  needle counts are correct.  The patient is awakened and taken to the  recovery room in stable condition.      Guy Sandifer  Henderson Cloud, M.D.  Electronically Signed     JET/MEDQ  D:  11/11/2005  T:  11/11/2005  Job:  [id number]

## 2011-01-05 LAB — BASIC METABOLIC PANEL
CO2: 24 mEq/L (ref 19–32)
Chloride: 102 mEq/L (ref 96–112)
GFR calc non Af Amer: >60 mL/min (ref >60)
Glucose, Bld: 104 mg/dL — ABNORMAL HIGH (ref 70–99)
Potassium: 4.5 mEq/L (ref 3.5–5.1)
Sodium: 137 mEq/L (ref 135–145)

## 2011-01-21 LAB — POCT HEMOGLOBIN-HEMACUE: Operator id: 123881

## 2011-02-24 ENCOUNTER — Ambulatory Visit (INDEPENDENT_AMBULATORY_CARE_PROVIDER_SITE_OTHER): Payer: PRIVATE HEALTH INSURANCE | Admitting: Internal Medicine

## 2011-02-24 VITALS — BP 122/80 | HR 90 | Temp 99.1°F | Resp 20 | Ht 68.0 in | Wt 124.0 lb

## 2011-02-24 DIAGNOSIS — R7309 Other abnormal glucose: Secondary | ICD-10-CM

## 2011-02-24 DIAGNOSIS — D529 Folate deficiency anemia, unspecified: Secondary | ICD-10-CM

## 2011-02-24 DIAGNOSIS — R10813 Right lower quadrant abdominal tenderness: Secondary | ICD-10-CM | POA: Insufficient documentation

## 2011-02-24 DIAGNOSIS — E538 Deficiency of other specified B group vitamins: Secondary | ICD-10-CM

## 2011-02-24 DIAGNOSIS — K589 Irritable bowel syndrome without diarrhea: Secondary | ICD-10-CM | POA: Insufficient documentation

## 2011-02-24 DIAGNOSIS — I10 Essential (primary) hypertension: Secondary | ICD-10-CM

## 2011-02-24 MED ORDER — HYOSCYAMINE SULFATE ER 0.375 MG PO TB12: 0.3750 mg | ORAL_TABLET | Freq: Two times a day (BID) | ORAL | Status: DC | PRN

## 2011-02-24 MED ORDER — CYANOCOBALAMIN 500 MCG/0.1ML NA SOLN: 0.1000 mL | NASAL | Status: DC

## 2011-02-24 NOTE — Progress Notes (Signed)
Subjective:    Patient ID: Lisa Long, female    DOB: 1965-07-02, 45 y.o.   MRN: [id number]  Abdominal Pain This is a recurrent problem. Episode onset: for 6 months. The onset quality is gradual. The problem occurs intermittently. The most recent episode lasted 6 months. The problem has been gradually worsening. The pain is located in the RLQ and LLQ. The pain is at a severity of 2/10. The pain is mild. The quality of the pain is a sensation of fullness. The abdominal pain does not radiate. Associated symptoms include diarrhea. Pertinent negatives include no anorexia, arthralgias, belching, constipation, dysuria, fever, flatus, frequency, headaches, hematochezia, hematuria, melena, myalgias, nausea, vomiting or weight loss. The pain is aggravated by drinking alcohol. The pain is relieved by nothing. She has tried nothing for the symptoms. The treatment provided no relief. Prior diagnostic workup includes ultrasound (u/s + for ovarian cysts per GYN).  Anemia Presents for follow-up visit. Symptoms include abdominal pain and malaise/fatigue. There has been no anorexia, bruising/bleeding easily, confusion, fever, leg swelling, light-headedness, pallor, palpitations, paresthesias, pica or weight loss. Signs of blood loss that are not present include hematemesis, hematochezia, melena and vaginal bleeding. Compliance problems include psychosocial issues.  Compliance with medications is 0-25%.  Hypertension This is a chronic problem. The current episode started more than 1 year ago. The problem has been gradually improving since onset. The problem is controlled. Associated symptoms include malaise/fatigue. Pertinent negatives include no anxiety, blurred vision, chest pain, headaches, neck pain, orthopnea, palpitations, peripheral edema, PND, shortness of breath or sweats. Past treatments include angiotensin blockers and calcium channel blockers. The current treatment provides significant improvement. Compliance  problems include exercise and diet.       Review of Systems  Constitutional: Positive for malaise/fatigue. Negative for fever, chills, weight loss, diaphoresis, activity change, appetite change, fatigue and unexpected weight change.  HENT: Negative.  Negative for neck pain.   Eyes: Negative.  Negative for blurred vision.  Respiratory: Negative for cough, choking, shortness of breath, wheezing and stridor.   Cardiovascular: Negative for chest pain, palpitations, orthopnea, leg swelling and PND.  Gastrointestinal: Positive for abdominal pain and diarrhea. Negative for nausea, vomiting, constipation, blood in stool, melena, hematochezia, abdominal distention, anal bleeding, rectal pain, anorexia, flatus and hematemesis.  Genitourinary: Negative for dysuria, urgency, frequency, hematuria, flank pain, decreased urine volume, vaginal bleeding, vaginal discharge, enuresis, difficulty urinating, genital sores, vaginal pain, menstrual problem and dyspareunia.  Musculoskeletal: Negative for myalgias, back pain, joint swelling, arthralgias and gait problem.  Skin: Negative for color change, pallor and rash.  Neurological: Negative for dizziness, tremors, seizures, syncope, facial asymmetry, speech difficulty, weakness, light-headedness, numbness, headaches and paresthesias.  Hematological: Negative for adenopathy. Does not bruise/bleed easily.  Psychiatric/Behavioral: Negative for suicidal ideas, hallucinations, behavioral problems, confusion, sleep disturbance, self-injury, dysphoric mood, decreased concentration and agitation. The patient is nervous/anxious. The patient is not hyperactive.        Objective:   Physical Exam  Vitals reviewed. Constitutional: She is oriented to person, place, and time. She appears well-developed and well-nourished. No distress.  HENT:  Head: Normocephalic and atraumatic.  Mouth/Throat: Oropharynx is clear and moist. No oropharyngeal exudate.  Eyes: Conjunctivae are  normal. Right eye exhibits no discharge. Left eye exhibits no discharge. No scleral icterus.  Neck: Normal range of motion. Neck supple. No JVD present. No tracheal deviation present. No thyromegaly present.  Cardiovascular: Normal rate, regular rhythm, normal heart sounds and intact distal pulses.  Exam reveals no gallop and no friction rub.  No murmur heard. Pulmonary/Chest: Effort normal and breath sounds normal. No stridor. No respiratory distress. She has no wheezes. She has no rales. She exhibits no tenderness.  Abdominal: Soft. Bowel sounds are normal. She exhibits no distension and no mass. There is no tenderness. There is no rebound and no guarding.  Musculoskeletal: Normal range of motion. She exhibits no edema and no tenderness.  Lymphadenopathy:    She has no cervical adenopathy.  Neurological: She is oriented to person, place, and time.  Skin: Skin is warm and dry. No rash noted. She is not diaphoretic. No erythema. No pallor.  Psychiatric: She has a normal mood and affect. Her behavior is normal. Judgment and thought content normal.      Lab Results  Component Value Date   WBC 7.8 02/26/2011   HGB 13.4 02/26/2011   HCT 39.4 02/26/2011   PLT 179.0 02/26/2011   GLUCOSE 150* 02/26/2011   ALT 34 02/26/2011   AST 54* 02/26/2011   NA 139 02/26/2011   K 3.9 02/26/2011   CL 103 02/26/2011   CREATININE 0.6 02/26/2011   BUN 11 02/26/2011   CO2 24 02/26/2011   TSH 0.65 02/26/2011   HGBA1C 5.6 02/26/2011      Assessment & Plan:

## 2011-02-24 NOTE — Patient Instructions (Signed)

## 2011-02-26 ENCOUNTER — Ambulatory Visit (INDEPENDENT_AMBULATORY_CARE_PROVIDER_SITE_OTHER)
Admission: RE | Admit: 2011-02-26 | Discharge: 2011-02-26 | Disposition: A | Discharge status: Home / Self Care | Payer: PRIVATE HEALTH INSURANCE | Source: Ambulatory Visit | Attending: Internal Medicine | Admitting: Internal Medicine

## 2011-02-26 ENCOUNTER — Telehealth: Payer: Self-pay | Admitting: *Deleted

## 2011-02-26 ENCOUNTER — Other Ambulatory Visit (INDEPENDENT_AMBULATORY_CARE_PROVIDER_SITE_OTHER): Payer: PRIVATE HEALTH INSURANCE

## 2011-02-26 DIAGNOSIS — R7309 Other abnormal glucose: Secondary | ICD-10-CM

## 2011-02-26 DIAGNOSIS — I10 Essential (primary) hypertension: Secondary | ICD-10-CM

## 2011-02-26 DIAGNOSIS — E538 Deficiency of other specified B group vitamins: Secondary | ICD-10-CM

## 2011-02-26 DIAGNOSIS — R10813 Right lower quadrant abdominal tenderness: Secondary | ICD-10-CM

## 2011-02-26 DIAGNOSIS — K589 Irritable bowel syndrome without diarrhea: Secondary | ICD-10-CM

## 2011-02-26 LAB — URINALYSIS, ROUTINE W REFLEX MICROSCOPIC
Specific Gravity, Urine: >=1.03 (ref 1.000–1.030)
Urine Glucose: NEGATIVE
pH: 5.5 (ref 5.0–8.0)

## 2011-02-26 LAB — COMPREHENSIVE METABOLIC PANEL
Albumin: 4.5 g/dL (ref 3.5–5.2)
Alkaline Phosphatase: 53 U/L (ref 39–117)
BUN: 11 mg/dL (ref 6–23)
Calcium: 9.4 mg/dL (ref 8.4–10.5)
Creatinine, Ser: 0.6 mg/dL (ref 0.4–1.2)
Glucose, Bld: 150 mg/dL — ABNORMAL HIGH (ref 70–99)

## 2011-02-26 LAB — CBC WITH DIFFERENTIAL/PLATELET
Basophils Absolute: 0 10*3/uL (ref 0.0–0.1)
Basophils Relative: 0.4 % (ref 0.0–3.0)
Eosinophils Absolute: 0.2 10*3/uL (ref 0.0–0.7)
Lymphocytes Relative: 22.5 % (ref 12.0–46.0)
Lymphs Abs: 1.8 10*3/uL (ref 0.7–4.0)
MCHC: 34 g/dL (ref 30.0–36.0)
MCV: 104.3 fl — ABNORMAL HIGH (ref 78.0–100.0)
Monocytes Absolute: 0.7 10*3/uL (ref 0.1–1.0)
Monocytes Relative: 8.8 % (ref 3.0–12.0)
Neutrophils Relative %: 65.9 % (ref 43.0–77.0)
RDW: 14.9 % — ABNORMAL HIGH (ref 11.5–14.6)

## 2011-02-26 LAB — TSH: TSH: 0.65 u[IU]/mL (ref 0.35–5.50)

## 2011-02-26 LAB — VITAMIN B12: Vitamin B-12: 224 pg/mL (ref 211–911)

## 2011-02-26 LAB — AMYLASE: Amylase: 46 U/L (ref 27–131)

## 2011-02-26 NOTE — Telephone Encounter (Signed)
Pt calling for her xray results, she states she is still having abd pain.

## 2011-02-26 NOTE — Telephone Encounter (Signed)
Pt advised and will continue meds as prescribed and include Prilosec.

## 2011-02-26 NOTE — Telephone Encounter (Signed)
Nov 23 lab and xrays all essentially normal, c/w no obstruction, and normal wbc  I would try to cont same medications, and include OTC prilosec 20 mg if not already taking a similar medication, and f/u with Dr [name] as needed

## 2011-02-28 ENCOUNTER — Encounter: Payer: Self-pay | Admitting: Internal Medicine

## 2011-02-28 DIAGNOSIS — D529 Folate deficiency anemia, unspecified: Secondary | ICD-10-CM | POA: Insufficient documentation

## 2011-02-28 MED ORDER — FOLIC ACID 1 MG PO TABS: 1.0000 mg | ORAL_TABLET | Freq: Every day | ORAL | Status: AC

## 2011-02-28 NOTE — Assessment & Plan Note (Signed)
Her exam is normal, I will check labs today to see if there is any organic pathology

## 2011-02-28 NOTE — Assessment & Plan Note (Signed)
Start folate replacement therapy 

## 2011-02-28 NOTE — Assessment & Plan Note (Signed)
She has not been getting B12 replacement for 3 months so today we gave her an injection of B12 and I asked her to start nascobal ns, I will check her CBC and B12 level today

## 2011-02-28 NOTE — Assessment & Plan Note (Signed)
I will check her a1c today 

## 2011-02-28 NOTE — Assessment & Plan Note (Signed)
Her symptoms are consistent with IBS so I recommended that she try levbid for the pain

## 2011-02-28 NOTE — Assessment & Plan Note (Signed)
Her BP is well controlled, I will check her lytes and renal function today 

## 2011-03-01 ENCOUNTER — Encounter: Payer: Self-pay | Admitting: Internal Medicine

## 2011-03-01 ENCOUNTER — Other Ambulatory Visit: Payer: Self-pay | Admitting: Internal Medicine

## 2011-03-01 NOTE — Telephone Encounter (Signed)
RX called in .

## 2011-09-26 ENCOUNTER — Emergency Department (HOSPITAL_COMMUNITY)
Admission: EM | Admit: 2011-09-26 | Discharge: 2011-09-26 | Disposition: A | Discharge status: Home / Self Care | Payer: 59 | Source: Home / Self Care

## 2011-09-26 ENCOUNTER — Encounter (HOSPITAL_COMMUNITY): Payer: Self-pay | Admitting: *Deleted

## 2011-09-26 DIAGNOSIS — H6690 Otitis media, unspecified, unspecified ear: Secondary | ICD-10-CM

## 2011-09-26 HISTORY — DX: Deficiency of other specified B group vitamins: E53.8

## 2011-09-26 MED ORDER — SODIUM CHLORIDE 0.65 % NA SOLN: 1.0000 | Freq: Four times a day (QID) | NASAL | Status: DC | PRN

## 2011-09-26 MED ORDER — AMOXICILLIN 500 MG PO CAPS: 500.0000 mg | ORAL_CAPSULE | Freq: Two times a day (BID) | ORAL | Status: AC

## 2011-09-26 MED ORDER — OXYMETAZOLINE HCL 0.05 % NA SOLN: 2.0000 | Freq: Two times a day (BID) | NASAL | Status: AC

## 2011-09-26 MED ORDER — CETIRIZINE HCL 10 MG PO CHEW: 10.0000 mg | CHEWABLE_TABLET | Freq: Every day | ORAL | Status: DC

## 2011-09-26 MED ORDER — IBUPROFEN 600 MG PO TABS: 600.0000 mg | ORAL_TABLET | Freq: Four times a day (QID) | ORAL | Status: AC | PRN

## 2011-09-26 NOTE — ED Provider Notes (Signed)
History     CSN: [id number]  Arrival date & time 09/26/11  1321   First MD Initiated Contact with Patient 09/26/11 1355      Chief Complaint  Patient presents with  . Otalgia    (Consider location/radiation/quality/duration/timing/severity/associated sxs/prior treatment) HPI 46 year old female with history of hypertension and vitamin B12 deficiency he presents with complaint of right ear pain for 3 weeks.  She reports that she has had sore throat on and off with right ear pain on and off for the last 3 weeks.  Recent episode started 2 days ago and has not been getting better.  She has been taking Claritin over-the-counter for 3 weeks without any benefit.  She presented to urgent care for further evaluation.  Past Medical History  Diagnosis Date  . Hypertension   . Vitamin B12 deficiency     Past Surgical History  Procedure Date  . Cervical fusion   . Hemorrhoid surgery   . Oophorectomy   . Breast surgery     benign breast cysts  . Dermoid cyst  excision   . Giant cell tumor - foot   . Abdominal hysterectomy     Family History  Problem Relation Age of Onset  . ALS Mother   . Diabetes Other   . Hypertension Other   . Stroke Other     F 1st degree relative <60    History  Substance Use Topics  . Smoking status: Current Everyday Smoker -- 0.5 packs/day for 25 years    Types: Cigarettes  . Smokeless tobacco: Never Used   Comment: Currently attending [place] [facility] program  . Alcohol Use: 12.0 oz/week    20 Glasses of wine per week     1-2 glasses wine daily    OB History    Grav Para Term Preterm Abortions TAB SAB Ect Mult Living                  Review of Systems  Constitutional: Negative.   HENT: Positive for ear pain, sore throat, postnasal drip and sinus pressure. Negative for hearing loss and tinnitus.   Eyes: Negative.   Respiratory: Negative.   Cardiovascular: Negative.   Gastrointestinal: Negative.   Genitourinary: Negative.     Musculoskeletal: Negative.   Skin: Negative.   Neurological: Negative.   Hematological: Negative.   Psychiatric/Behavioral: Negative.     Allergies  Review of patient's allergies indicates no known allergies.  Home Medications   Current Outpatient Rx  Name Route Sig Dispense Refill  . AMLODIPINE-OLMESARTAN 5-40 MG PO TABS Oral Take 1 tablet by mouth daily.      [name] NASCOBAL 500 MCG/0.1ML NA SOLN  INSTILL 1 SPRAY IN A NOSTRIL ONCE DAILY 1.3 mL 2  . AMOXICILLIN 500 MG PO CAPS Oral Take 1 capsule (500 mg total) by mouth 2 (two) times daily. 14 capsule 0  . CETIRIZINE HCL 10 MG PO CHEW Oral Chew 1 tablet (10 mg total) by mouth daily. 10 tablet 0  . FOLIC ACID 1 MG PO TABS Oral Take 1 tablet (1 mg total) by mouth daily. 30 tablet 11  . HYOSCYAMINE SULFATE ER 0.375 MG PO TB12 Oral Take 1 tablet (0.375 mg total) by mouth every 12 (twelve) hours as needed for cramping. 60 tablet 5  . OXYMETAZOLINE HCL 0.05 % NA SOLN Nasal Place 2 sprays into the nose 2 (two) times daily. For 4-5 days only. 30 mL 0  . SODIUM CHLORIDE 0.65 % NA SOLN Nasal Place  1 spray into the nose 4 (four) times daily as needed for congestion. 15 mL 12    BP 137/90  Pulse 94  Temp 98.2 F (36.8 C) (Oral)  Resp 16  SpO2 100%  Physical Exam  Nursing note and vitals reviewed. Constitutional: She is oriented to person, place, and time. She appears well-developed and well-nourished.  HENT:  Head: Normocephalic and atraumatic.  Right Ear: External ear normal.  Left Ear: External ear normal.       Right tympanic membrane slightly erythematous.  Bilateral nares slightly erythematous.  Eyes: Conjunctivae are normal. Pupils are equal, round, and reactive to light.  Neck: Normal range of motion. Neck supple.  Cardiovascular: Normal rate and regular rhythm.   Pulmonary/Chest: Effort normal and breath sounds normal.  Abdominal: Soft. Bowel sounds are normal.  Genitourinary:       Not done.  Musculoskeletal: Normal range of  motion.  Neurological: She is alert and oriented to person, place, and time.  Skin: Skin is warm.  Psychiatric: She has a normal mood and affect.    ED Course  Procedures (including critical care time)  Labs Reviewed - No data to display No results found.   1. Otitis media     MDM  Given the erythema in the right tympanic membrane suspect patient likely has otitis media.  Start the patient on amoxicillin 500 mg twice daily.  Also prescribed patient afrin, Zyrtec, and with saline nasal spray for presumed seasonal allergy which probably triggered otitis media.  Instructed the patient that if her symptoms are not improving she is to followup with her primary care physician Dr. [name] in 1 week.  Discontinue Claritin as she has been on this medication for 3 weeks.  Ibuprofen 600 mg every 6 hours as needed for pain prescribed.  Cristal Ford, MD 09/26/11 [phone]

## 2011-09-26 NOTE — Discharge Instructions (Signed)
Otitis Media, Adult  A middle ear infection is an infection in the space behind the eardrum. The medical name for this is "otitis media." It may happen after a common cold. It is caused by a germ that starts growing in that space. You may feel swollen glands in your neck on the side of the ear infection.  HOME CARE INSTRUCTIONS   · Take your medicine as directed until it is gone, even if you feel better after the first few days.  · Only take over-the-counter or prescription medicines for pain, discomfort, or fever as directed by your caregiver.  · Occasional use of a nasal decongestant a couple times per day may help with discomfort and help the eustachian tube to drain better.  Follow up with your caregiver in 10 to 14 days or as directed, to be certain that the infection has cleared. Not keeping the appointment could result in a chronic or permanent injury, pain, hearing loss and disability. If there is any problem keeping the appointment, you must call back to this facility for assistance.  SEEK IMMEDIATE MEDICAL CARE IF:   · You are not getting better in 2 to 3 days.  · You have pain that is not controlled with medication.  · You feel worse instead of better.  · You cannot use the medication as directed.  · You develop swelling, redness or pain around the ear or stiffness in your neck.  MAKE SURE YOU:   · Understand these instructions.  · Will watch your condition.  · Will get help right away if you are not doing well or get worse.  Document Released: 12/26/2003 Document Revised: 03/11/2011 Document Reviewed: 10/27/2007  ExitCare® Patient Information ©2012 [employer].

## 2011-09-26 NOTE — ED Notes (Signed)
C/O intermittent pain inside "and behind" right ear x 3 wks, but pain has become constant x 2 days.  Pain intensifies when eating or laying down.  Right mastoid process non-tender to palpation.  States was out of work 6 days ago for cold sxs; has felt feverish, but denies fevers.  Has been taking Tylenol with relief.

## 2011-10-06 ENCOUNTER — Ambulatory Visit (INDEPENDENT_AMBULATORY_CARE_PROVIDER_SITE_OTHER): Payer: 59 | Admitting: Internal Medicine

## 2011-10-06 ENCOUNTER — Other Ambulatory Visit (INDEPENDENT_AMBULATORY_CARE_PROVIDER_SITE_OTHER): Payer: 59

## 2011-10-06 ENCOUNTER — Encounter: Payer: Self-pay | Admitting: Internal Medicine

## 2011-10-06 VITALS — BP 140/88 | HR 90 | Temp 98.3°F | Resp 16 | Wt 123.0 lb

## 2011-10-06 DIAGNOSIS — E538 Deficiency of other specified B group vitamins: Secondary | ICD-10-CM

## 2011-10-06 DIAGNOSIS — F17200 Nicotine dependence, unspecified, uncomplicated: Secondary | ICD-10-CM

## 2011-10-06 DIAGNOSIS — R7401 Elevation of levels of liver transaminase levels: Secondary | ICD-10-CM

## 2011-10-06 DIAGNOSIS — D529 Folate deficiency anemia, unspecified: Secondary | ICD-10-CM

## 2011-10-06 DIAGNOSIS — R7309 Other abnormal glucose: Secondary | ICD-10-CM

## 2011-10-06 DIAGNOSIS — I10 Essential (primary) hypertension: Secondary | ICD-10-CM

## 2011-10-06 DIAGNOSIS — R7402 Elevation of levels of lactic acid dehydrogenase (LDH): Secondary | ICD-10-CM

## 2011-10-06 LAB — COMPREHENSIVE METABOLIC PANEL
ALT: 74 U/L — ABNORMAL HIGH (ref 0–35)
AST: 102 U/L — ABNORMAL HIGH (ref 0–37)
Albumin: 4.8 g/dL (ref 3.5–5.2)
Calcium: 10.4 mg/dL (ref 8.4–10.5)
Chloride: 95 mEq/L — ABNORMAL LOW (ref 96–112)
Potassium: 4.2 mEq/L (ref 3.5–5.1)

## 2011-10-06 LAB — CBC WITH DIFFERENTIAL/PLATELET
Eosinophils Relative: 1.3 % (ref 0.0–5.0)
Lymphocytes Relative: 17.1 % (ref 12.0–46.0)
Monocytes Relative: 7.8 % (ref 3.0–12.0)
Neutrophils Relative %: 73.8 % (ref 43.0–77.0)
Platelets: 151 10*3/uL (ref 150.0–400.0)
WBC: 6.1 10*3/uL (ref 4.5–10.5)

## 2011-10-06 LAB — VITAMIN B12: Vitamin B-12: 308 pg/mL (ref 211–911)

## 2011-10-06 LAB — HEMOGLOBIN A1C: Hgb A1c MFr Bld: 5.8 % (ref 4.6–6.5)

## 2011-10-06 LAB — FOLATE: Folate: 9.7 ng/mL (ref >5.9)

## 2011-10-06 MED ORDER — CYANOCOBALAMIN 500 MCG/0.1ML NA SOLN: NASAL | Status: DC

## 2011-10-06 NOTE — Patient Instructions (Signed)
Pernicious Anemia Pernicious anemia may be an immune system illness. It causes the production of antibodies to cells of the stomach (parietal cells), and proteins produced by the stomach, which are needed to absorb vitamin B12. The result of this illness is the body does not absorb enough B12 from the diet. This leads to lessened red blood cell production which causes anemia. Vitamin B12 is needed for making red blood cells and keeping the nervous system healthy. Not enough vitamin B12 in your system slowly affects sensory and motor nerves. This causes problems with your nervous system (neurological) to develop over time. Neurological effects of vitamin B12 deficiency may be seen before anemia is diagnosed. This affects both men and women, between ages 40 and 70. The anemia also affects the bowel, the heart and vascular systems and cannot be prevented. CAUSES  Pernicious anemia is due to a lack of substance called intrinsic factor. This is a substance made by cells in the stomach. It makes it possible to absorb vitamin B12. The reason for the lack of this substance is unknown but it may be autoimmune, genetic, or both. SYMPTOMS  The following problems may be seen with this illness:  Problems develop slowly.   Rapid heart rate.   Nausea, appetite loss, and weight loss.   Difficulty maintaining proper balance.   Yellow eyes and skin.   Loss of deep tendon reflexes.   Depression.   Confusion, poor memory, and dementia.   Ringing in the ears (tinnitus).   Weakness, especially in the arms and legs.   Sore tongue.   Numbness or tingling in the hands and feet.   Pale lips, tongue, and gums.   Bleeding gums.   Shortness of breath.   Headache.   Fatigue.  RISK OF VITAMIN B12 DEFICIENCY INCREASES WITH:  Diseases or surgery affecting the stomach.   Diabetes and autoimmune disorders. Autoimmune disorders are diseases where the body makes antibodies which attack your own body tissues.     Thyroid disorders.   Genetic factors, such as in people of Northern European ancestry. It is rare in African Americans and Asians.   Family history of pernicious anemia.   Age over 40.   Strict vegetarian diet or infants breast-fed by a mother on a strict vegetarian diet.   Lack of stomach acid in older adults.   Parasitic infections and intestinal diseases.   Drugs such as H2 blockers, proton pump inhibitors, colchicine, neomycin, and aminosalicylic acid.   Alcoholism.  PREVENTION  Pernicious anemia cannot be prevented but vitamin B12 deficiencies can be prevented.   For pernicious anemia, lifelong vitamin B12 therapy will help symptoms and prevent complications.   Dietary changes can prevent deficiency. B12 is mostly from animal sources so a deficiency is more likely in a vegetarian who does not eat eggs or dairy products.  RELATED COMPLICATIONS  Heart failure.   Nerve damage that can not be reversed.   Gastric cancer.  DIAGNOSIS  Your caregiver can determine what is wrong by:  Doing blood tests for vitamin B12 levels.   Checking for antibodies to the intrinsic factor.   Measuring the body's ability to absorb vitamin B12.  TREATMENT   Life long treatment usually involves vitamin B12 replacement. Monthly vitamin B12 injections are the treatment of choice to correct vitamin B12 deficiency and may be given by the patient. This therapy corrects the anemia and it may correct the neurological complications if given early enough. About 1% of vitamin B12 is absorbed (even in   the absence of intrinsic factor) so some caregivers recommend that elderly patients with gastric atrophy take oral vitamin B12 supplements in addition to monthly injections.   Some symptoms should start to clear up in a few days after treatment begins but other symptoms may take several months.   Additionally, other conditions which may lead to a deficiency should be treated.   Stop drinking alcohol  if alcoholism led to the vitamin B12 deficiency.   For other patients, the vitamin may be taken by mouth or as a nasal gel (or in addition to injections).   Iron supplements may be prescribed.   Avoid taking high amounts of folic acid. It can mask the signs of vitamin B12 deficiency.   Activity may be limited until symptoms improve.   Eat a well-balanced diet.   People on strict vegetarian diets can change their diet or take vitamin B12 supplements for life.  PROGNOSIS   When caught early, the prognosis is good. Most people will do well.   Patients with this illness have a higher incidence of cancer and polyps of the stomach.   Nervous system problems may not improve if treatment does not start soon enough.  Document Released: 06/12/2003 Document Revised: 03/11/2011 Document Reviewed: 03/19/2008 ExitCare Patient Information 2012 [employer]. 

## 2011-10-06 NOTE — Progress Notes (Signed)
Subjective:    Patient ID: Lisa Long, female    DOB: Jul 10, 1965, 46 y.o.   MRN: [id number]  Hypertension This is a chronic problem. The current episode started more than 1 year ago. The problem is unchanged. The problem is controlled. Associated symptoms include anxiety and malaise/fatigue. Pertinent negatives include no blurred vision, chest pain, headaches, neck pain, orthopnea, palpitations, peripheral edema, PND, shortness of breath or sweats. Agents associated with hypertension include NSAIDs. Past treatments include angiotensin blockers and calcium channel blockers. The current treatment provides moderate improvement. There are no compliance problems.   Anemia Presents for follow-up visit. Symptoms include malaise/fatigue. There has been no abdominal pain, anorexia, bruising/bleeding easily, confusion, fever, leg swelling, light-headedness, pallor, palpitations, paresthesias, pica or weight loss. Signs of blood loss that are not present include hematemesis, hematochezia and melena. Compliance problems include medication side effects.  Compliance with medications is 0-25%.      Review of Systems  Constitutional: Positive for malaise/fatigue and fatigue. Negative for fever, chills, weight loss, diaphoresis, activity change, appetite change and unexpected weight change.  HENT: Negative.  Negative for neck pain.   Eyes: Negative.  Negative for blurred vision.  Respiratory: Negative for cough, chest tightness, shortness of breath, wheezing and stridor.   Cardiovascular: Negative for chest pain, palpitations, orthopnea, leg swelling and PND.  Gastrointestinal: Negative for nausea, vomiting, abdominal pain, diarrhea, constipation, blood in stool, melena, hematochezia, anal bleeding, rectal pain, anorexia and hematemesis.  Genitourinary: Negative.   Musculoskeletal: Negative.   Skin: Negative for color change, pallor, rash and wound.  Neurological: Negative for dizziness, tremors, weakness,  light-headedness, numbness, headaches and paresthesias.  Hematological: Negative for adenopathy. Does not bruise/bleed easily.  Psychiatric/Behavioral: Negative for suicidal ideas, hallucinations, behavioral problems, confusion, disturbed wake/sleep cycle, self-injury, dysphoric mood, decreased concentration and agitation. The patient is nervous/anxious. The patient is not hyperactive.        Objective:   Physical Exam  Vitals reviewed. Constitutional: She is oriented to person, place, and time. She appears well-developed and well-nourished.  Non-toxic appearance. She does not have a sickly appearance. She does not appear ill. No distress.  HENT:  Head: Normocephalic and atraumatic.  Mouth/Throat: Oropharynx is clear and moist. No oropharyngeal exudate.  Eyes: Conjunctivae are normal. Right eye exhibits no discharge. Left eye exhibits no discharge. No scleral icterus.  Neck: Normal range of motion. Neck supple. No JVD present. No tracheal deviation present. No thyromegaly present.  Cardiovascular: Normal rate, regular rhythm, normal heart sounds and intact distal pulses.  Exam reveals no gallop and no friction rub.   No murmur heard. Pulmonary/Chest: Effort normal and breath sounds normal. No stridor. No respiratory distress. She has no wheezes. She has no rales. She exhibits no tenderness.  Abdominal: Soft. Bowel sounds are normal. She exhibits no distension and no mass. There is no tenderness. There is no rebound and no guarding.  Musculoskeletal: Normal range of motion. She exhibits no edema and no tenderness.  Lymphadenopathy:    She has no cervical adenopathy.  Neurological: She is oriented to person, place, and time.  Skin: Skin is warm and dry. No rash noted. She is not diaphoretic. No erythema. No pallor.  Psychiatric: Her speech is normal and behavior is normal. Judgment and thought content normal. Her mood appears anxious. Her affect is not angry, not blunt, not labile and not  inappropriate. She is not agitated, not aggressive and is not hyperactive. Thought content is not paranoid. Cognition and memory are normal. She does not exhibit a  depressed mood. She expresses no homicidal and no suicidal ideation. She is attentive.     Lab Results  Component Value Date   WBC 7.8 02/26/2011   HGB 13.4 02/26/2011   HCT 39.4 02/26/2011   PLT 179.0 02/26/2011   GLUCOSE 150* 02/26/2011   ALT 34 02/26/2011   AST 54* 02/26/2011   NA 139 02/26/2011   K 3.9 02/26/2011   CL 103 02/26/2011   CREATININE 0.6 02/26/2011   BUN 11 02/26/2011   CO2 24 02/26/2011   TSH 0.65 02/26/2011   HGBA1C 5.6 02/26/2011      Assessment & Plan:

## 2011-10-07 ENCOUNTER — Encounter: Payer: Self-pay | Admitting: Internal Medicine

## 2011-10-07 DIAGNOSIS — K7010 Alcoholic hepatitis without ascites: Secondary | ICD-10-CM | POA: Insufficient documentation

## 2011-10-07 NOTE — Assessment & Plan Note (Signed)
She is down to a few cigs per day and unfortunately she feels more anxious but she is using nicotine patches and a behav mod program at [facility]

## 2011-10-07 NOTE — Assessment & Plan Note (Addendum)
Her LFT elevation is c/w her EtOH intake and I discussed this with her today, over the last week she cut down her EtoH intake by 1/2 and will try to abstain, I recommended that she attend AA meetings, I will recheck her LFT's today, she has been vaccinated for Hep A and B thru her employer

## 2011-10-07 NOTE — Assessment & Plan Note (Signed)
She has not received B12 for 2 months, I will check her level today and gave her samples of nascobal ns

## 2011-10-07 NOTE — Assessment & Plan Note (Signed)
Her BP is adequately controlled, I will check her lytes and renal function today 

## 2011-10-07 NOTE — Assessment & Plan Note (Signed)
She has not taken folate for months, I will recheck her level today as well as her CBC

## 2011-10-07 NOTE — Assessment & Plan Note (Signed)
I will check her A1c level today to see if she has developed DM II

## 2012-03-27 ENCOUNTER — Ambulatory Visit (INDEPENDENT_AMBULATORY_CARE_PROVIDER_SITE_OTHER): Payer: 59 | Admitting: Internal Medicine

## 2012-03-27 ENCOUNTER — Encounter: Payer: Self-pay | Admitting: Internal Medicine

## 2012-03-27 ENCOUNTER — Ambulatory Visit (INDEPENDENT_AMBULATORY_CARE_PROVIDER_SITE_OTHER)
Admission: RE | Admit: 2012-03-27 | Discharge: 2012-03-27 | Disposition: A | Discharge status: Home / Self Care | Payer: 59 | Source: Ambulatory Visit | Attending: Internal Medicine | Admitting: Internal Medicine

## 2012-03-27 VITALS — BP 140/92 | HR 80 | Temp 99.9°F | Resp 16 | Wt 131.0 lb

## 2012-03-27 DIAGNOSIS — R05 Cough: Secondary | ICD-10-CM

## 2012-03-27 DIAGNOSIS — R059 Cough, unspecified: Secondary | ICD-10-CM

## 2012-03-27 DIAGNOSIS — I10 Essential (primary) hypertension: Secondary | ICD-10-CM

## 2012-03-27 DIAGNOSIS — J209 Acute bronchitis, unspecified: Secondary | ICD-10-CM | POA: Insufficient documentation

## 2012-03-27 MED ORDER — HYDROCODONE-HOMATROPINE 5-1.5 MG/5ML PO SYRP: 5.0000 mL | ORAL_SOLUTION | Freq: Three times a day (TID) | ORAL | Status: DC | PRN

## 2012-03-27 MED ORDER — CEFUROXIME AXETIL 500 MG PO TABS: 500.0000 mg | ORAL_TABLET | Freq: Two times a day (BID) | ORAL | Status: DC

## 2012-03-27 NOTE — Progress Notes (Signed)
Subjective:    Patient ID: Lisa Long, female    DOB: 03-Sep-1965, 46 y.o.   MRN: [id number]  Cough This is a recurrent problem. The current episode started 1 to 4 weeks ago. The problem has been gradually worsening. The problem occurs every few hours. The cough is productive of purulent sputum. Associated symptoms include chills, myalgias, a sore throat and shortness of breath. Pertinent negatives include no chest pain, ear congestion, ear pain, fever, headaches, heartburn, hemoptysis, nasal congestion, postnasal drip, rash, rhinorrhea, sweats, weight loss or wheezing. Risk factors for lung disease include smoking/tobacco exposure. She has tried OTC cough suppressant for the symptoms. The treatment provided mild relief. There is no history of COPD, emphysema or pneumonia.  Hypertension This is a chronic problem. The problem is unchanged. The problem is uncontrolled. Associated symptoms include shortness of breath. Pertinent negatives include no anxiety, blurred vision, chest pain, headaches, malaise/fatigue, neck pain, orthopnea, palpitations, peripheral edema, PND or sweats. Agents associated with hypertension include decongestants. Past treatments include angiotensin blockers and calcium channel blockers. Compliance problems include exercise and diet.       Review of Systems  Constitutional: Positive for chills and fatigue. Negative for fever, weight loss, malaise/fatigue, diaphoresis, activity change, appetite change and unexpected weight change.  HENT: Positive for sore throat. Negative for ear pain, rhinorrhea, neck pain and postnasal drip.   Eyes: Negative.  Negative for blurred vision.  Respiratory: Positive for cough and shortness of breath. Negative for apnea, hemoptysis, choking, chest tightness, wheezing and stridor.   Cardiovascular: Negative.  Negative for chest pain, palpitations, orthopnea and PND.  Gastrointestinal: Negative.  Negative for heartburn.  Genitourinary: Negative.    Musculoskeletal: Positive for myalgias.  Skin: Negative.  Negative for rash.  Neurological: Negative.  Negative for headaches.  Hematological: Negative for adenopathy. Does not bruise/bleed easily.  Psychiatric/Behavioral: Negative.        Objective:   Physical Exam  Vitals reviewed. Constitutional: She is oriented to person, place, and time. She appears well-developed and well-nourished.  Non-toxic appearance. She does not have a sickly appearance. She does not appear ill. No distress.  HENT:  Head: Normocephalic and atraumatic.  Mouth/Throat: Oropharynx is clear and moist. No oropharyngeal exudate.  Eyes: Conjunctivae normal are normal. Right eye exhibits no discharge. Left eye exhibits no discharge. No scleral icterus.  Neck: Normal range of motion. Neck supple. No JVD present. No tracheal deviation present. No thyromegaly present.  Cardiovascular: Normal rate, regular rhythm, normal heart sounds and intact distal pulses.  Exam reveals no gallop and no friction rub.   No murmur heard. Pulmonary/Chest: Effort normal and breath sounds normal. No stridor. No respiratory distress. She has no wheezes. She has no rales. She exhibits no tenderness.  Abdominal: Soft. Bowel sounds are normal. She exhibits no distension and no mass. There is no tenderness. There is no rebound and no guarding.  Musculoskeletal: Normal range of motion. She exhibits no edema and no tenderness.  Lymphadenopathy:    She has no cervical adenopathy.  Neurological: She is oriented to person, place, and time.  Skin: Skin is warm and dry. No rash noted. She is not diaphoretic. No erythema. No pallor.  Psychiatric: She has a normal mood and affect. Her behavior is normal. Judgment and thought content normal.      Lab Results  Component Value Date   WBC 6.1 10/06/2011   HGB 13.2 10/06/2011   HCT 38.9 10/06/2011   PLT 151.0 10/06/2011   GLUCOSE 128* 10/06/2011   ALT  74* 10/06/2011   AST 102* 10/06/2011   NA 136 10/06/2011   K  4.2 10/06/2011   CL 95* 10/06/2011   CREATININE 0.6 10/06/2011   BUN 10 10/06/2011   CO2 28 10/06/2011   TSH 0.65 02/26/2011   HGBA1C 5.8 10/06/2011      Assessment & Plan:

## 2012-03-27 NOTE — Assessment & Plan Note (Signed)
I will check her CXR to see if she has pna, mass, copd

## 2012-03-27 NOTE — Assessment & Plan Note (Signed)
Her BP is high, she has been taking decongestants She will stop the decongestants and will recheck her BP in a few weeks

## 2012-03-27 NOTE — Assessment & Plan Note (Signed)
Start ceftin for the infection and a cough suppressant 

## 2012-03-27 NOTE — Patient Instructions (Signed)

## 2012-12-04 IMAGING — CR DG ABDOMEN ACUTE W/ 1V CHEST
3 series · 3 of 3 positions shown · non-contrast
Comparison: None.

CLINICAL DATA: Pain and bloating for 6 months.  Severe for 3 months
with diarrhea and heartburn

ACUTE ABDOMEN SERIES (ABDOMEN 2 VIEW & CHEST 1 VIEW)

[view not recorded (1 of 3)]
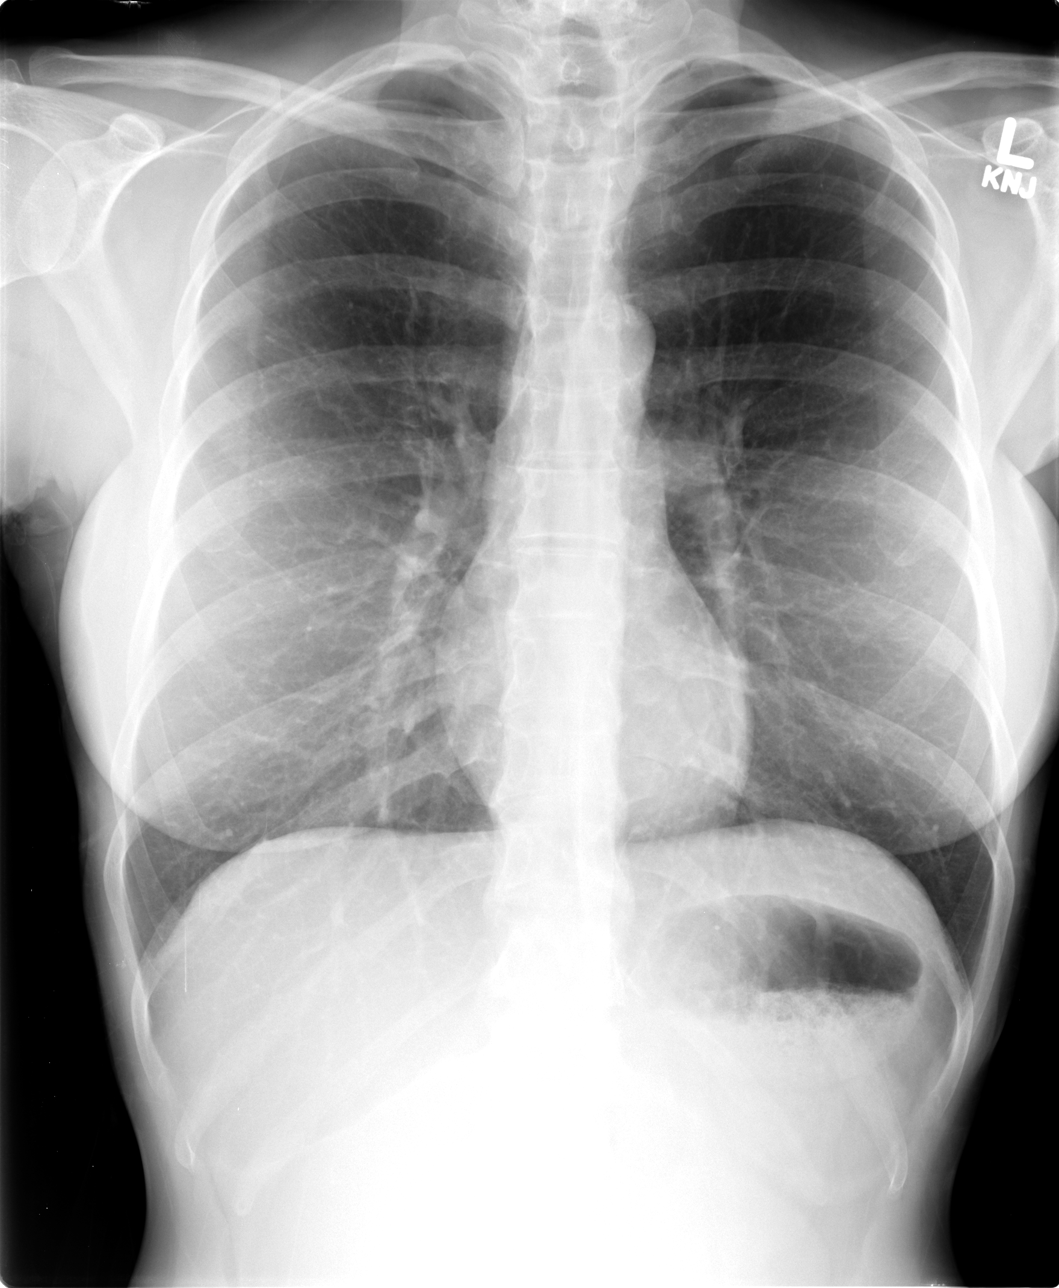

[view not recorded (2 of 3)]
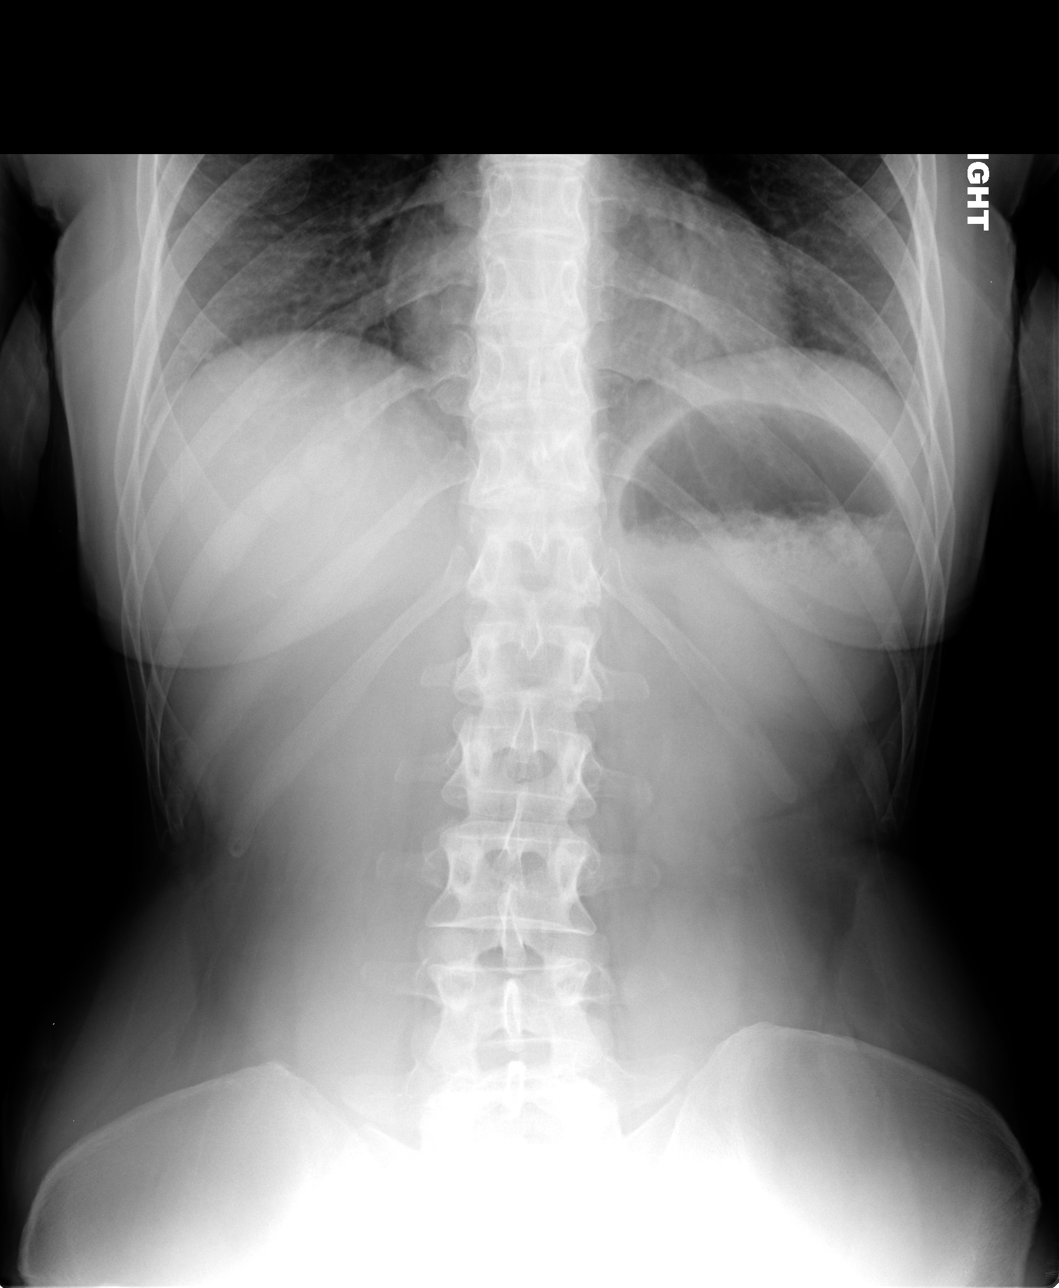

[view not recorded (3 of 3)]
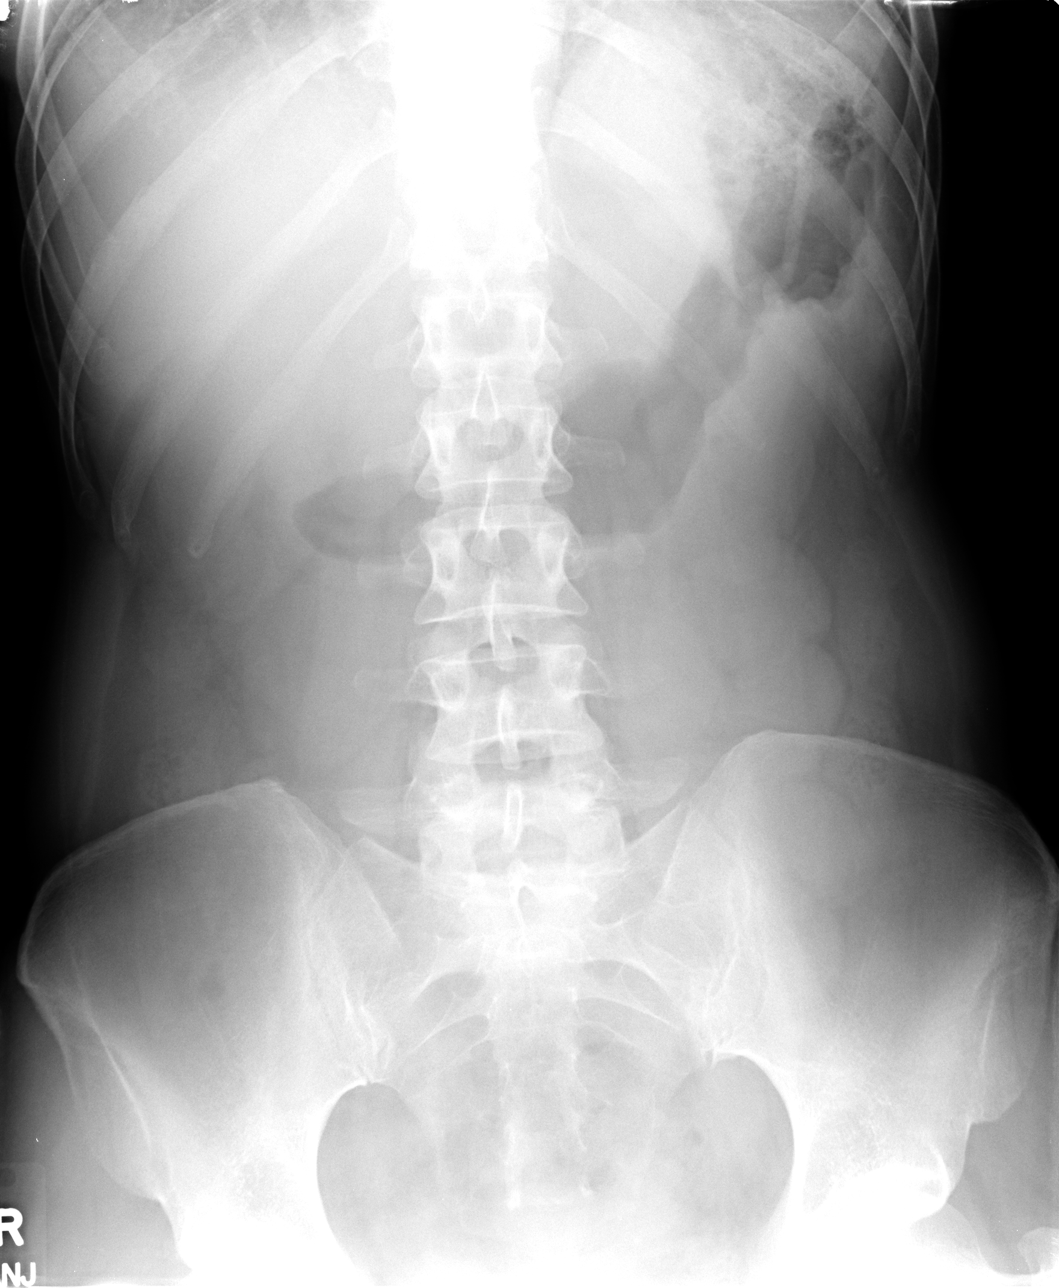

[3 of 3 positions shown; findings below may reference images not displayed]

FINDINGS: Chest x-ray:  Heart and mediastinal contours are within
normal limits.  The lung fields are clear with no signs of focal
infiltrate or congestive failure.  No pleural fluid or
peribronchial cuffing is seen.

Bony structures appear intact.

A relatively gasless abdomen is identified with stool seen along
the course of the colon.  A nonobstructive pattern is seen with no
air fluid levels identified on the upright film.  No abnormal
calcifications or focal bony abnormalities are seen and the
properitoneal fat planes appear maintained.
IMPRESSION: Negative chest.  Nonspecific abdomen with no adverse features
suggested.

## 2013-01-25 ENCOUNTER — Other Ambulatory Visit: Payer: Self-pay | Admitting: Orthopedic Surgery

## 2013-01-26 ENCOUNTER — Encounter (HOSPITAL_BASED_OUTPATIENT_CLINIC_OR_DEPARTMENT_OTHER): Payer: Self-pay | Admitting: *Deleted

## 2013-01-26 ENCOUNTER — Other Ambulatory Visit: Payer: Self-pay

## 2013-01-26 ENCOUNTER — Encounter (HOSPITAL_BASED_OUTPATIENT_CLINIC_OR_DEPARTMENT_OTHER)
Admission: RE | Admit: 2013-01-26 | Discharge: 2013-01-26 | Disposition: A | Discharge status: Home / Self Care | Payer: 59 | Source: Ambulatory Visit | Attending: Orthopedic Surgery | Admitting: Orthopedic Surgery

## 2013-01-26 DIAGNOSIS — Z01810 Encounter for preprocedural cardiovascular examination: Secondary | ICD-10-CM | POA: Insufficient documentation

## 2013-01-26 LAB — BASIC METABOLIC PANEL
BUN: 9 mg/dL (ref 6–23)
Chloride: 97 mEq/L (ref 96–112)
Creatinine, Ser: 0.52 mg/dL (ref 0.50–1.10)
GFR calc Af Amer: >90 mL/min (ref >90)
GFR calc non Af Amer: >90 mL/min (ref >90)
Glucose, Bld: 92 mg/dL (ref 70–99)

## 2013-01-26 NOTE — Progress Notes (Signed)
To come by for [id number]

## 2013-01-29 NOTE — H&P (Signed)
  Lisa Long is an 47 y.o. female.   Chief Complaint: c/o mass beneath nailplate right long finger. HPI:     Lisa Long is referred through the courtesy of her dermatologist for evaluation of a mass beneath her right long fingernail.  She is 47 years-old and right-hand dominant.  She reports history of throbbing pain deep to her right long fingernail increasing over the past six weeks.  She has no history of penetrating injury or a splinter or other object beneath her nail plate.  She has no history of fever blisters or known exposure to herpes simplex; however, she is in a patient care environment.     Past Medical History  Diagnosis Date  . Hypertension   . Vitamin B12 deficiency   . Seasonal allergies     Past Surgical History  Procedure Laterality Date  . Cervical fusion    . Hemorrhoid surgery    . Oophorectomy    . Breast surgery      benign breast cysts  . Dermoid cyst  excision    . 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 Abdominal hysterectomy    . Colonoscopy      Family History  Problem Relation Age of Onset  . ALS Mother   . Diabetes Other   . Hypertension Other   . Stroke Other     F 1st degree relative <60   Social History:  reports that she has been smoking Cigarettes.  She has a 12.5 pack-year smoking history. She has never used smokeless tobacco. She reports that she drinks about 12.0 ounces of alcohol per week. She reports that she does not use illicit drugs.  Allergies: No Known Allergies  No prescriptions prior to admission    No results found for this or any previous visit (from the past 48 hour(s)).  No results found.   Pertinent items are noted in HPI.  Height 5\' 8"  (1.727 m), weight 58.968 kg (130 lb).  General appearance: alert Head: Normocephalic, without obvious abnormality Neck: supple, symmetrical, trachea midline Resp: clear to auscultation bilaterally Cardio: regular rate and rhythm GI: normal findings: bowel sounds  normal Extremities: .  Inspection of her hand reveals a fullness of her right long finger pulp and tenderness along the hyponychium.  There is thickening of the ventral nail fold deep to the nail plate.  There is no ridging of the nail.  She has polish on and I am unable to determine whether or not she has a lesion visible through the nail plate.  She has full range of motion of her fingers.  She is tender to percussion or direct pressure of her nail fold, nail plate or pulp.    RADIOGRAPHS:   Four views of her finger are reviewed.  There is no sign of bony erosion.     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  Plan:To the OR for biopsy of right long finger nailfold.The procedure, risks,benefits and post-op course were discussed with the patient at length and they were in agreement with the plan.  DASNOIT,Velton Roselle J 01/29/2013, 4:16 PM  H&P documentation: 01/30/2013  -History and Physical Reviewed  -Patient has been re-examined  -No change in the plan of care  Wyn Forster, MD

## 2013-01-30 ENCOUNTER — Ambulatory Visit (HOSPITAL_BASED_OUTPATIENT_CLINIC_OR_DEPARTMENT_OTHER): Payer: 59 | Admitting: *Deleted

## 2013-01-30 ENCOUNTER — Encounter (HOSPITAL_BASED_OUTPATIENT_CLINIC_OR_DEPARTMENT_OTHER): Payer: 59 | Admitting: *Deleted

## 2013-01-30 ENCOUNTER — Encounter (HOSPITAL_BASED_OUTPATIENT_CLINIC_OR_DEPARTMENT_OTHER): Payer: Self-pay | Admitting: *Deleted

## 2013-01-30 ENCOUNTER — Encounter (HOSPITAL_BASED_OUTPATIENT_CLINIC_OR_DEPARTMENT_OTHER)
Admission: RE | Disposition: A | Discharge status: Home / Self Care | Payer: Self-pay | Source: Ambulatory Visit | Attending: Orthopedic Surgery

## 2013-01-30 ENCOUNTER — Ambulatory Visit (HOSPITAL_BASED_OUTPATIENT_CLINIC_OR_DEPARTMENT_OTHER)
Admission: RE | Admit: 2013-01-30 | Discharge: 2013-01-30 | Disposition: A | Discharge status: Home / Self Care | Payer: 59 | Source: Ambulatory Visit | Attending: Orthopedic Surgery | Admitting: Orthopedic Surgery

## 2013-01-30 DIAGNOSIS — E538 Deficiency of other specified B group vitamins: Secondary | ICD-10-CM | POA: Insufficient documentation

## 2013-01-30 DIAGNOSIS — B079 Viral wart, unspecified: Secondary | ICD-10-CM | POA: Insufficient documentation

## 2013-01-30 DIAGNOSIS — Z01810 Encounter for preprocedural cardiovascular examination: Secondary | ICD-10-CM | POA: Insufficient documentation

## 2013-01-30 DIAGNOSIS — I10 Essential (primary) hypertension: Secondary | ICD-10-CM | POA: Insufficient documentation

## 2013-01-30 DIAGNOSIS — Z01812 Encounter for preprocedural laboratory examination: Secondary | ICD-10-CM | POA: Insufficient documentation

## 2013-01-30 DIAGNOSIS — L608 Other nail disorders: Secondary | ICD-10-CM | POA: Insufficient documentation

## 2013-01-30 DIAGNOSIS — J309 Allergic rhinitis, unspecified: Secondary | ICD-10-CM | POA: Insufficient documentation

## 2013-01-30 HISTORY — PX: NAILBED REPAIR: SHX5028

## 2013-01-30 HISTORY — DX: Other seasonal allergic rhinitis: J30.2

## 2013-01-30 LAB — POCT HEMOGLOBIN-HEMACUE: Hemoglobin: 13.8 g/dL (ref 12.0–15.0)

## 2013-01-30 SURGERY — REPAIR, NAIL BED
Anesthesia: General | Site: Finger | Laterality: Right | Wound class: Clean

## 2013-01-30 MED ORDER — MIDAZOLAM HCL 5 MG/5ML IJ SOLN
INTRAMUSCULAR | Status: DC | PRN
  Administered 2013-01-30: 2 mg via INTRAVENOUS

## 2013-01-30 MED ORDER — HYDROMORPHONE HCL 2 MG PO TABS: 2.0000 mg | ORAL_TABLET | ORAL | Status: DC | PRN

## 2013-01-30 MED ORDER — EPHEDRINE SULFATE 50 MG/ML IJ SOLN
INTRAMUSCULAR | Status: DC | PRN
  Administered 2013-01-30 (×2): 5 mg via INTRAVENOUS
  Administered 2013-01-30: 10 mg via INTRAVENOUS

## 2013-01-30 MED ORDER — CHLORHEXIDINE GLUCONATE 4 % EX LIQD: 60.0000 mL | Freq: Once | CUTANEOUS | Status: DC

## 2013-01-30 MED ORDER — HYDROMORPHONE HCL PF 1 MG/ML IJ SOLN
0.2500 mg | INTRAMUSCULAR | Status: DC | PRN
Start: 2013-01-30 — End: 2013-01-30

## 2013-01-30 MED ORDER — FENTANYL CITRATE 0.05 MG/ML IJ SOLN
INTRAMUSCULAR | Status: DC | PRN
  Administered 2013-01-30: 100 ug via INTRAVENOUS
  Administered 2013-01-30: 50 ug via INTRAVENOUS

## 2013-01-30 MED ORDER — ONDANSETRON HCL 4 MG/2ML IJ SOLN: 4.0000 mg | Freq: Once | INTRAMUSCULAR | Status: DC | PRN

## 2013-01-30 MED ORDER — OXYCODONE HCL 5 MG/5ML PO SOLN: 5.0000 mg | Freq: Once | ORAL | Status: DC | PRN

## 2013-01-30 MED ORDER — OXYCODONE HCL 5 MG PO TABS: 5.0000 mg | ORAL_TABLET | Freq: Once | ORAL | Status: DC | PRN

## 2013-01-30 MED ORDER — ONDANSETRON HCL 4 MG/2ML IJ SOLN
INTRAMUSCULAR | Status: DC | PRN
  Administered 2013-01-30: 4 mg via INTRAVENOUS

## 2013-01-30 MED ORDER — FENTANYL CITRATE 0.05 MG/ML IJ SOLN: 50.0000 ug | INTRAMUSCULAR | Status: DC | PRN

## 2013-01-30 MED ORDER — MIDAZOLAM HCL 2 MG/2ML IJ SOLN
INTRAMUSCULAR | Status: AC
  Filled 2013-01-30: qty 4

## 2013-01-30 MED ORDER — LIDOCAINE HCL 2 % IJ SOLN
INTRAMUSCULAR | Status: AC
  Filled 2013-01-30: qty 20

## 2013-01-30 MED ORDER — LIDOCAINE HCL (CARDIAC) 20 MG/ML IV SOLN
INTRAVENOUS | Status: DC | PRN
  Administered 2013-01-30: 100 mg via INTRAVENOUS

## 2013-01-30 MED ORDER — PROPOFOL 10 MG/ML IV BOLUS
INTRAVENOUS | Status: DC | PRN
  Administered 2013-01-30: 30 mg via INTRAVENOUS
  Administered 2013-01-30: 200 mg via INTRAVENOUS

## 2013-01-30 MED ORDER — LACTATED RINGERS IV SOLN
INTRAVENOUS | Status: DC
  Administered 2013-01-30 (×2): via INTRAVENOUS

## 2013-01-30 MED ORDER — LIDOCAINE HCL 2 % IJ SOLN
INTRAMUSCULAR | Status: DC | PRN
  Administered 2013-01-30: 6 mL

## 2013-01-30 MED ORDER — MIDAZOLAM HCL 2 MG/2ML IJ SOLN: 1.0000 mg | INTRAMUSCULAR | Status: DC | PRN

## 2013-01-30 MED ORDER — FENTANYL CITRATE 0.05 MG/ML IJ SOLN
INTRAMUSCULAR | Status: AC
  Filled 2013-01-30: qty 4

## 2013-01-30 SURGICAL SUPPLY — 47 items
BANDAGE ADHESIVE 1X3 (GAUZE/BANDAGES/DRESSINGS) IMPLANT
BANDAGE COBAN STERILE 2 (GAUZE/BANDAGES/DRESSINGS) IMPLANT
BANDAGE CONFORM 3  STR LF (GAUZE/BANDAGES/DRESSINGS) IMPLANT
BLADE MINI RND TIP GREEN BEAV (BLADE) IMPLANT
BLADE SURG 15 STRL LF DISP TIS (BLADE) ×1 IMPLANT
BLADE SURG 15 STRL SS (BLADE) ×1
BNDG COHESIVE 1X5 TAN STRL LF (GAUZE/BANDAGES/DRESSINGS) IMPLANT
BNDG ESMARK 4X9 LF (GAUZE/BANDAGES/DRESSINGS) IMPLANT
BRUSH SCRUB EZ PLAIN DRY (MISCELLANEOUS) ×2 IMPLANT
CORDS BIPOLAR (ELECTRODE) ×2 IMPLANT
COVER MAYO STAND STRL (DRAPES) ×2 IMPLANT
COVER TABLE BACK 60X90 (DRAPES) ×2 IMPLANT
CUFF TOURNIQUET SINGLE 18IN (TOURNIQUET CUFF) IMPLANT
DECANTER SPIKE VIAL GLASS SM (MISCELLANEOUS) IMPLANT
DRAPE EXTREMITY T 121X128X90 (DRAPE) ×2 IMPLANT
DRAPE SURG 17X23 STRL (DRAPES) ×2 IMPLANT
GAUZE XEROFORM 1X8 LF (GAUZE/BANDAGES/DRESSINGS) ×2 IMPLANT
GLOVE BIO SURGEON STRL SZ7 (GLOVE) ×2 IMPLANT
GLOVE BIOGEL M STRL SZ7.5 (GLOVE) IMPLANT
GLOVE ORTHO TXT STRL SZ7.5 (GLOVE) ×2 IMPLANT
GOWN BRE IMP PREV XXLGXLNG (GOWN DISPOSABLE) ×2 IMPLANT
GOWN PREVENTION PLUS XLARGE (GOWN DISPOSABLE) IMPLANT
GOWN STRL REIN 2XL LVL4 (GOWN DISPOSABLE) ×2 IMPLANT
LOOP VESSEL MAXI BLUE (MISCELLANEOUS) IMPLANT
NEEDLE 27GAX1X1/2 (NEEDLE) ×2 IMPLANT
NEEDLE HYPO 25X1 1.5 SAFETY (NEEDLE) IMPLANT
NS IRRIG 1000ML POUR BTL (IV SOLUTION) ×2 IMPLANT
PACK BASIN DAY SURGERY FS (CUSTOM PROCEDURE TRAY) ×2 IMPLANT
PAD CAST 3X4 CTTN HI CHSV (CAST SUPPLIES) IMPLANT
PADDING CAST ABS 4INX4YD NS (CAST SUPPLIES)
PADDING CAST ABS COTTON 4X4 ST (CAST SUPPLIES) IMPLANT
PADDING CAST COTTON 3X4 STRL (CAST SUPPLIES)
SPONGE GAUZE 4X4 12PLY (GAUZE/BANDAGES/DRESSINGS) ×2 IMPLANT
STOCKINETTE 4X48 STRL (DRAPES) ×2 IMPLANT
SUT CHROMIC 5 0 P 3 (SUTURE) IMPLANT
SUT CHROMIC 6 0 CE2 363 13 (SUTURE) IMPLANT
SUT CHROMIC 6 0 PS 4 (SUTURE) ×2 IMPLANT
SUT ETHILON 5 0 P 3 18 (SUTURE)
SUT NYLON ETHILON 5-0 P-3 1X18 (SUTURE) IMPLANT
SUT PROLENE 3 0 PS 2 (SUTURE) IMPLANT
SUT VIC AB 4-0 P2 18 (SUTURE) IMPLANT
SYR 3ML 23GX1 SAFETY (SYRINGE) IMPLANT
SYR BULB 3OZ (MISCELLANEOUS) IMPLANT
SYR CONTROL 10ML LL (SYRINGE) ×2 IMPLANT
TOWEL OR 17X24 6PK STRL BLUE (TOWEL DISPOSABLE) ×2 IMPLANT
TRAY DSU PREP LF (CUSTOM PROCEDURE TRAY) ×2 IMPLANT
UNDERPAD 30X30 INCONTINENT (UNDERPADS AND DIAPERS) ×2 IMPLANT

## 2013-01-30 NOTE — Anesthesia Procedure Notes (Signed)
Procedure Name: LMA Insertion Date/Time: 01/30/2013 9:32 AM Performed by: Wyn Forster Pre-anesthesia Checklist: Patient identified, Emergency Drugs available, Suction available and Patient being monitored Patient Re-evaluated:Patient Re-evaluated prior to inductionOxygen Delivery Method: Circle System Utilized Preoxygenation: Pre-oxygenation with 100% oxygen Intubation Type: IV induction Ventilation: Mask ventilation without difficulty LMA: LMA inserted LMA Size: 3.0 Number of attempts: 1 Airway Equipment and Method: bite block Placement Confirmation: positive ETCO2 and breath sounds checked- equal and bilateral Tube secured with: Tape Dental Injury: Teeth and Oropharynx as per pre-operative assessment

## 2013-01-30 NOTE — Op Note (Signed)
[id number] 

## 2013-01-30 NOTE — Anesthesia Preprocedure Evaluation (Signed)
Anesthesia Evaluation  Patient identified by MRN, date of birth, ID band Patient awake    Reviewed: Allergy & Precautions, H&P , NPO status , Patient's Chart, lab work & pertinent test results  Airway Mallampati: I TM Distance: >3 FB Neck ROM: Full    Dental  (+) Teeth Intact and Dental Advisory Given   Pulmonary  breath sounds clear to auscultation        Cardiovascular hypertension, Pt. on medications Rhythm:Regular Rate:Normal     Neuro/Psych    GI/Hepatic GERD-  Medicated and Controlled,  Endo/Other    Renal/GU      Musculoskeletal   Abdominal   Peds  Hematology   Anesthesia Other Findings   Reproductive/Obstetrics                          Anesthesia Physical Anesthesia Plan  ASA: II  Anesthesia Plan: General   Post-op Pain Management:    Induction: Intravenous  Airway Management Planned: LMA  Additional Equipment:   Intra-op Plan:   Post-operative Plan: Extubation in OR  Informed Consent: I have reviewed the patients History and Physical, chart, labs and discussed the procedure including the risks, benefits and alternatives for the proposed anesthesia with the patient or authorized representative who has indicated his/her understanding and acceptance.   Dental advisory given  Plan Discussed with: CRNA, Anesthesiologist and Surgeon  Anesthesia Plan Comments:         Anesthesia Quick Evaluation  

## 2013-01-30 NOTE — Transfer of Care (Signed)
Immediate Anesthesia Transfer of Care Note  Patient: Lisa Long  Procedure(s) Performed: Procedure(s) with comments: BIOPSY OF RIGHT LONG NAIL FOLD (Right) - Penrose used as tourniquet--on at 0944, off at 1001.  Patient Location: PACU  Anesthesia Type:General  Level of Consciousness: awake, sedated and responds to stimulation  Airway & Oxygen Therapy: Patient Spontanous Breathing and Patient connected to face mask oxygen  Post-op Assessment: Report given to PACU RN and Post -op Vital signs reviewed and stable  Post vital signs: Reviewed and stable  Complications: No apparent anesthesia complications

## 2013-01-30 NOTE — Brief Op Note (Signed)
01/30/2013  10:12 AM  PATIENT:  Lisa Long  47 y.o. female  PRE-OPERATIVE DIAGNOSIS:  MASS UNDER RIGHT LONG FINGER NAIL  POST-OPERATIVE DIAGNOSIS:  MASS UNDER RIGHT LONG FINGER NAIL  PROCEDURE:  Procedure(s) with comments: BIOPSY OF RIGHT LONG NAIL FOLD (Right) - Penrose used as tourniquet--on at 0944, off at 1001.  SURGEON:  Surgeon(s) and Role:    * Wyn Forster., MD - Primary  PHYSICIAN ASSISTANT:   ASSISTANTS: nurse  ANESTHESIA:   general  EBL:  Total I/O In: 200 [I.V.:200] Out: -   BLOOD ADMINISTERED:none  DRAINS: none   LOCAL MEDICATIONS USED:  XYLOCAINE   SPECIMEN:  Biopsy / Limited Resection  DISPOSITION OF SPECIMEN:  PATHOLOGY  COUNTS:  YES  TOURNIQUET:  * No tourniquets in log *  DICTATION: .Other Dictation: Dictation Number [id number]  PLAN OF CARE: Discharge to home after PACU  PATIENT DISPOSITION:  PACU - hemodynamically stable.   Delay start of Pharmacological VTE agent (>24hrs) due to surgical blood loss or risk of bleeding: not applicable

## 2013-01-30 NOTE — Anesthesia Postprocedure Evaluation (Signed)
  Anesthesia Post-op Note  Patient: Lisa Long  Procedure(s) Performed: Procedure(s) with comments: BIOPSY OF RIGHT LONG NAIL FOLD (Right) - Penrose used as tourniquet--on at 0944, off at 1001.  Patient Location: PACU  Anesthesia Type:General  Level of Consciousness: awake, alert  and oriented  Airway and Oxygen Therapy: Patient Spontanous Breathing  Post-op Pain: mild  Post-op Assessment: Post-op Vital signs reviewed  Post-op Vital Signs: Reviewed  Complications: No apparent anesthesia complications

## 2013-01-31 NOTE — Op Note (Signed)
NAMEMarland Long  DELTHA, BERNALES NO.:  [id number]  MEDICAL RECORD NO.:  [id number]  LOCATION:                                 FACILITY:  PHYSICIAN:  Katy Fitch. Tidus Upchurch, M.D.      DATE OF BIRTH:  DATE OF PROCEDURE:  01/30/2013 DATE OF DISCHARGE:                              OPERATIVE REPORT   PREOPERATIVE DIAGNOSES:  Enlarging mass beneath right long finger nail plate and hyponychium with marked pain, rule out glomus tumor versus other pathology including herpetic whitlow infection, subungual verruca versus poorly characterized subungual tumor.  POSTOPERATIVE DIAGNOSES:  Subungual mass, diagnosis uncertain pending pathologic evaluation of incisional biopsy specimen.  OPERATION:  Incisional biopsy of subungual mass, right long finger.  OPERATING SURGEON:  Katy Fitch. Lisa Long, M.D.  ASSISTANT:  Nurse.  ANESTHESIA:  General by LMA per patient request supplemented by 2% lidocaine metacarpal head level digital block 6 mL of 2% lidocaine without epinephrine.  SUPERVISING ANESTHESIOLOGIST:  Sheldon Silvan, M.D.  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  She presented for consult on January 24, 2013, reporting that she had, had a multiple-week history of throbbing pain beneath the nail.  The pain was very much interfering with her activities of daily living.  She could recall no penetrating injury beneath the nail plate or hyponychium.  She had, had no recent infections.  She was unaware of any herpes simplex infections in her household, although she stated that her dog had, had a crusty lesion and a veterinarian had suggested it might be a herpetic infection.  During our consultation, I noted a mass beneath her nail plate with a crusty area in the hyponychium.  Given the history of the dog's possible viral infection and the degree of pain she is  experiencing and a crusty skin lesion, I recommended a trial of Neurontin and famciclovir in an effort to manage what could possibly be a herpetic whitlow.  Given the mass effect, I was concerned that there may be some type of soft tissue tumor growing beneath the nail.  Lisa Long returns on January 30, 2013, after 6 days of the medical regimen of Neurontin and famciclovir with no change in her pain or the appearance of her finger.  At this time, she has a crusty area beneath the radial nail plate and clear mass effect beneath the nail.  Prior x-rays obtained on January 24, 2013, did not reveal evidence of bone destruction or erosion.  We recommended proceeding with an incisional biopsy.  Lisa Long was accompanied by her husband, Lisa Long during our preoperative consult in the holding area.  Mr. and Lisa Long expressed the desire to have the finger corrected today.  I had a detailed informed consent with them in which I pointed out that at the present time we had absolutely no idea what the nature of the lesion was.  The only way we could assure that the lesion was resected with a single surgical event would be to perform a complete ablation of the nail plate with wide margins and skin graft at this  time.  I advised them that my personal judgment was to perform an incisional biopsy to allow diagnosis by our pathology colleagues or dermatopathology colleagues followed by a definitive procedure if necessary.  If this turns out to be a verrucous lesion, simple instrumentation of the lesion for biopsy may be enough to invoke an immune response and allow the verrucous lesion to resolve over time.  It is possible with the pain pattern that this could be a glomus tumor variant.  In my 32 years of experience, I have seen a number of malignant lesions around the nail fold that present with mass and pain, which are only able to be sorted out with a proper biopsy.  After a  detailed informed consent, Mr. and Lisa Long understood that unless she chose a complete ablation of the nail plate with graft, we could not in any way guarantee that the predicament would be solved with a single surgical event.  On her way to the OR, they acknowledged that they understand that we Long perform a diagnostic biopsy at this time and likely need to return for second-stage definitive procedure at a later date.  Her right long finger was marked as the proper surgical site per protocol with a marking pen.  Questions were invited and answered in detail.  Preoperatively, she was interviewed by Dr. Ivin Booty of Anesthesia.  She requested general anesthesia.  Dr. Ivin Booty recommended general anesthesia by LMA, which was accepted by Ms. Lisa Long.  PROCEDURE:  Marlynn Hinckley was brought to room 6 of the [facility] and placed in a supine position upon the operating table.  Following the induction of general anesthesia by LMA technique under Dr. Ivin Booty' direct supervision, the right hand and arm were prepped with Betadine soap and solution, sterilely draped.  A pneumatic tourniquet was not utilized as we intended to use a 0.5 inch wide Penrose drain over the proximal phalangeal segment as a digital tourniquet.  Following a routine surgical time-out, the right long finger was exsanguinated with a gauze wrap and a half-inch Penrose drain placed over the proximal phalangeal segment of the finger as a digital tourniquet.  The procedure commenced with a careful elevation of the nail plate off of the nail matrix with a Therapist, nutritional.  The nail was placed in Betadine to soak for approximately 15 minutes.  We carefully inspected the nail fold.  There was a very firm mass on the distal subungual aspect of the nail mechanism beneath the central and radial aspect of the ventral nail fold.  This was not typical of a glomus.  There was no purple blush and its consistency was firm rather  than soft.  I determined that a partial incisional biopsy of the nail matrix and an incisional biopsy of the mass would be most appropriate.  I performed a layer cake type resection of the ventral nail fold, the mass, and continued down to the periosteum and hyponychial skin to provide a representative specimen for our pathology colleagues.  I then carefully inspected the wound with loupe magnification and identified some abnormal-appearing shiny, but firm tissue infiltrating towards the central aspect of the hyponychium.  This was also partially wedged for a representative biopsy.  No effort was made to obtain surgical margins with this procedure.  The incisional portion of the ventral nail fold was then repaired with a single suture of 6-0 chromic.  The hyponychium was left open and was dressed with Xeroflo and sterile gauze.  The tourniquet was released  and bleeding controlled by elevation of the finger above the heart and pressure for 10 minutes.  At the conclusion of the procedure, I would state for the record that I was not at all clear what the nature of this lesion is.  The features that are a bit of concern are the fact that it was rather firm and highly vascular.  I could not identify any evidence of bony erosion, infection, or typical verrucous tissue.  This did not have the consistency of a typical glomus tumor.  The specimens were placed in formalin and passed off for pathologic evaluation.  We Long notify Mr. and Ms. Kirn as soon as we have information from her pathology colleagues.  There is a significant chance that they Long need to return to the operating room for definitive procedure.    Katy Fitch Rusti Arizmendi, M.D.    RVS/MEDQ  D:  01/30/2013  T:  01/31/2013  Job:  [id number]  cc:   Sanda Linger, MD

## 2013-02-01 ENCOUNTER — Encounter (HOSPITAL_BASED_OUTPATIENT_CLINIC_OR_DEPARTMENT_OTHER): Payer: Self-pay | Admitting: Orthopedic Surgery

## 2013-02-16 ENCOUNTER — Ambulatory Visit (INDEPENDENT_AMBULATORY_CARE_PROVIDER_SITE_OTHER): Payer: Self-pay | Admitting: Neurology

## 2013-02-16 ENCOUNTER — Encounter: Payer: Self-pay | Admitting: Neurology

## 2013-02-16 VITALS — BP 152/90 | HR 90 | Ht 68.0 in | Wt 128.0 lb

## 2013-02-16 DIAGNOSIS — M79609 Pain in unspecified limb: Secondary | ICD-10-CM

## 2013-02-16 MED ORDER — GABAPENTIN 300 MG PO CAPS: ORAL_CAPSULE | ORAL | Status: DC

## 2013-02-16 NOTE — Progress Notes (Signed)
Reason for visit: Arm pain   Lisa Long is an 47 y.o. female  History of present illness:  Ms. Lollis is a 47 year old patient with a recent history of a lesion involving the middle finger of the right hand. The patient had the lesion resected, with resection of the nail. Prior to surgery, she had some discomfort in the region of the growth on the tip of the finger for 2 months. The surgery was done on 01/30/2013. The patient had localized pain to the finger for 6 days following surgery, but then the pain seemed to spread. The patient is having increasing pain involving the right hand. The patient will have episodes of severe cold, and crushing sensations that involved the fourth and fifth fingers on the right hand, and the palm of the hand. The patient will have an achy sensation going up into the elbow on the right. The patient denies any neck pain, shoulder discomfort, or weakness of the right arm. The patient has had no other symptoms involving the left arm or the lower extremities. The patient denies any pain induced by turning the head from side to side, and she denies any balance issues or problems controlling the bowels or the bladder. The patient comes to this office for an evaluation. The patient has been placed on low-dose gabapentin, currently taking 200 mg 3 times daily. The episodes of pain are much worse during the evening hours. The patient has not been sleeping well because the pain. No alteration in color of the hand or arm has been noted. The patient indicates that during the bouts of severe pain, she may have flexion of the fourth and fifth fingers.  Past Medical History  Diagnosis Date  . Hypertension   . Vitamin B12 deficiency   . Seasonal allergies     Past Surgical History  Procedure Laterality Date  . Cervical fusion    . Hemorrhoid surgery    . Oophorectomy    . Breast surgery      benign breast cysts  . Dermoid cyst  excision    . Giant cell tumor - foot   2012    left foot  . Abdominal hysterectomy    . Colonoscopy    . Nailbed repair Right 01/30/2013    Procedure: BIOPSY OF RIGHT LONG NAIL FOLD;  Surgeon: Wyn Forster., MD;  Location: [facility];  Service: Orthopedics;  Laterality: Right;  Penrose used as tourniquet--on at 0944, off at 1001.    Family History  Problem Relation Age of Onset  . ALS Mother   . Diabetes Other   . Hypertension Other   . Stroke Other     F 1st degree relative <60    Social history:  reports that she has been smoking Cigarettes.  She has a 12.5 pack-year smoking history. She has never used smokeless tobacco. She reports that she drinks about 12.0 ounces of alcohol per week. She reports that she does not use illicit drugs.   No Known Allergies  Medications:  Current Outpatient Prescriptions on File Prior to Visit  Medication Sig Dispense Refill  . amLODipine-olmesartan (AZOR) 5-40 MG per tablet Take 1 tablet by mouth daily.        [name] HYDROmorphone (DILAUDID) 2 MG tablet Take 1 tablet (2 mg total) by mouth every 4 (four) hours as needed for pain.  30 tablet  0  . cetirizine (ZYRTEC) 10 MG chewable tablet Chew 1 tablet (10 mg total) by  mouth daily.  10 tablet  0  . hyoscyamine (LEVBID) 0.375 MG 12 hr tablet Take 1 tablet (0.375 mg total) by mouth every 12 (twelve) hours as needed for cramping.  60 tablet  5  . sodium chloride (OCEAN) 0.65 % nasal spray Place 1 spray into the nose 4 (four) times daily as needed for congestion.  15 mL  12   No current facility-administered medications on file prior to visit.    ROS:  Out of a complete 14 system review of symptoms, the patient complains only of the following symptoms, and all other reviewed systems are negative.  Numbness, pain  Blood pressure 152/90, pulse 90, height 5\' 8"  (1.727 m), weight 128 lb (58.06 kg).  Physical Exam  General: The patient is alert and cooperative at the time of the examination.  Skin: No significant  peripheral edema is noted.   Neurologic Exam  Mental status: The patient is oriented x 3.  Cranial nerves: Facial symmetry is present. Speech is normal, no aphasia or dysarthria is noted. Extraocular movements are full. Visual fields are full.  Motor: The patient has good strength in all 4 extremities.  Sensory examination: Pinprick and vibratory sensation on the arms and legs is symmetric and normal.  Coordination: The patient has good finger-nose-finger and heel-to-shin bilaterally.  Gait and station: The patient has a normal gait. Tandem gait is normal. Romberg is negative. No drift is seen.  Reflexes: Deep tendon reflexes are symmetric.   Assessment/Plan:  1. Lesion resected from tip of right middle finger  2. Discomfort involving the right hand, fourth and fifth fingers and ulnar aspect of palm  The patient describes a severe neuropathic type pain well outside the distribution of the surgery. The pain comes and goes, associated with a cold, pressing sensation, associated with flexion of the fourth and fifth fingers. Nerve conduction studies have been done involving the right arm that were normal. No evidence of an ulnar neuropathy or median neuropathy is seen. EMG evaluation will be done on the right arm. The patient has a history of prior cervical spine surgery. The clinical description of pain appears to potentially be consistent with type I complex regional pain syndrome. If the EMG is unremarkable, the patient will be referred for treatment through a pain center. The gabapentin will be increased to 300 mg twice daily, 600 mg at night. Cymbalta may be added to this regimen in the future. Early aggressive treatment of this pain will be important to reducing the chance of a more chronic, permanent pain syndrome.  Marlan Palau MD 02/16/2013 7:42 PM  [facility] [address] [place], [place] [address]  Phone [phone] Fax [phone]

## 2013-02-19 ENCOUNTER — Encounter (INDEPENDENT_AMBULATORY_CARE_PROVIDER_SITE_OTHER): Payer: Self-pay | Admitting: Neurology

## 2013-02-19 ENCOUNTER — Other Ambulatory Visit: Payer: Self-pay | Admitting: Neurology

## 2013-02-19 DIAGNOSIS — M79609 Pain in unspecified limb: Secondary | ICD-10-CM

## 2013-02-19 MED ORDER — PREDNISONE 5 MG PO TABS: ORAL_TABLET | ORAL | Status: DC

## 2013-02-19 MED ORDER — GABAPENTIN 300 MG PO CAPS: ORAL_CAPSULE | ORAL | Status: DC

## 2013-02-19 NOTE — Procedures (Signed)
     HISTORY:  Lisa Long is a 47 year old patient with a several week history of significant bouts of pain involving the right hand, occasionally associated with partial flexion of the fourth and fifth fingers. The patient has a prior history of cervical spine surgery at the C5-6 level. The patient being evaluated for possible neuropathy or a cervical radiculopathy. The pain in the right hand came on within 7 days after a surgical procedure involving the middle finger of the right hand.  NERVE CONDUCTION STUDIES:  Nerve conduction studies were performed on both upper extremities. The distal motor latencies and motor amplitudes for the median and ulnar nerves were within normal limits. The F wave latencies and nerve conduction velocities for these nerves were also normal. The sensory latencies for the median and ulnar nerves were normal.   EMG STUDIES:  EMG study was performed on the right upper extremity:  The first dorsal interosseous muscle reveals 2 to 4 K units with full recruitment. No fibrillations or positive waves were noted. The abductor pollicis brevis muscle reveals 2 to 4 K units with full recruitment. No fibrillations or positive waves were noted. The extensor indicis proprius muscle reveals 1 to 3 K units with full recruitment. No fibrillations or positive waves were noted. The pronator teres muscle reveals 2 to 3 K units with full recruitment. No fibrillations or positive waves were noted. The biceps muscle reveals 1 to 2 K units with full recruitment. No fibrillations or positive waves were noted. The triceps muscle reveals 2 to 4 K units with full recruitment. No fibrillations or positive waves were noted. Complex repetitive discharges were seen. The anterior deltoid muscle reveals 2 to 3 K units with full recruitment. No fibrillations or positive waves were noted. The cervical paraspinal muscles were tested at 2 levels. No abnormalities of insertional activity were seen  at either level tested. There was good relaxation.   IMPRESSION:  Nerve conduction studies of both upper extremities were unremarkable. EMG evaluation of the right upper extremity was unremarkable, without evidence of an acute cervical radiculopathy, and no evidence of a neuropathy involving the right upper extremity.  Marlan Palau MD 02/19/2013 7:56 AM  [facility] [address] [place], [place] [address]  Phone [phone] Fax [phone]

## 2013-02-19 NOTE — Progress Notes (Deleted)
Subjective:    Patient ID: Lisa Long is a 47 y.o. female.  HPI {Common ambulatory SmartLinks:[id number]}  Review of Systems  Objective:  Neurologic Exam  Physical Exam  Assessment:   ***  Plan:   ***

## 2013-02-20 ENCOUNTER — Ambulatory Visit: Payer: Self-pay | Admitting: Neurology

## 2013-03-07 ENCOUNTER — Other Ambulatory Visit: Payer: Self-pay | Admitting: Neurology

## 2013-03-07 ENCOUNTER — Ambulatory Visit (INDEPENDENT_AMBULATORY_CARE_PROVIDER_SITE_OTHER): Payer: 59

## 2013-03-07 DIAGNOSIS — M47812 Spondylosis without myelopathy or radiculopathy, cervical region: Secondary | ICD-10-CM

## 2013-03-09 ENCOUNTER — Telehealth: Payer: Self-pay | Admitting: Neurology

## 2013-03-09 NOTE — Telephone Encounter (Signed)
I discussed the results of the MRI with the patient. 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 The patient does not have typical radicular complaints with shoulder pain, neck discomfort, and pain radiating down the arm. There is still is a possibility, however, that the neck is the source of the discomfort in the arm. At some point, an epidural steroid injection into the neck may be considered. This may have diagnostic and therapeutic benefit.

## 2013-03-14 ENCOUNTER — Encounter: Payer: Self-pay | Admitting: Neurology

## 2013-09-10 ENCOUNTER — Other Ambulatory Visit: Payer: Self-pay | Admitting: Neurology

## 2013-09-10 ENCOUNTER — Telehealth: Payer: Self-pay | Admitting: Neurology

## 2013-09-10 MED ORDER — PREDNISONE 10 MG PO TABS: ORAL_TABLET | ORAL | Status: DC

## 2013-09-10 MED ORDER — PREDNISONE 10 MG PO TABS
ORAL_TABLET | ORAL | Status: DC
Start: 2013-09-10 — End: 2013-09-10

## 2013-09-10 NOTE — Telephone Encounter (Signed)
The patient called on 09/09/2013 indicating a significant worsening of neck discomfort. She has taken some gabapentin and she had one tablet of prednisone left from her prior arm discomfort. I will call in a prednisone Dosepak for her, if the discomfort does not improve, MRI evaluation the cervical spine may need to be repeated.

## 2013-09-21 ENCOUNTER — Telehealth: Payer: Self-pay

## 2013-09-21 NOTE — Telephone Encounter (Signed)
The pharmacy sent [place] a fax saying a Prior Auth was needed for Prednisone.  We contacted the ins, and [employer] sent [place] a letter responding by saying a Prior Josem Kaufmann is not required, and this drug is covered under the plans benefit.  Re: ID [id number] [place]

## 2014-01-10 ENCOUNTER — Encounter: Payer: Self-pay | Admitting: Internal Medicine

## 2014-01-10 ENCOUNTER — Ambulatory Visit (INDEPENDENT_AMBULATORY_CARE_PROVIDER_SITE_OTHER)
Admission: RE | Admit: 2014-01-10 | Discharge: 2014-01-10 | Disposition: A | Discharge status: Home / Self Care | Payer: 59 | Source: Ambulatory Visit | Attending: Internal Medicine | Admitting: Internal Medicine

## 2014-01-10 ENCOUNTER — Ambulatory Visit: Payer: 59 | Admitting: Internal Medicine

## 2014-01-10 ENCOUNTER — Other Ambulatory Visit (INDEPENDENT_AMBULATORY_CARE_PROVIDER_SITE_OTHER): Payer: 59

## 2014-01-10 ENCOUNTER — Ambulatory Visit (INDEPENDENT_AMBULATORY_CARE_PROVIDER_SITE_OTHER): Payer: 59 | Admitting: Internal Medicine

## 2014-01-10 VITALS — BP 120/80 | HR 108 | Temp 98.5°F | Resp 16 | Ht 68.0 in | Wt 121.8 lb

## 2014-01-10 DIAGNOSIS — I10 Essential (primary) hypertension: Secondary | ICD-10-CM

## 2014-01-10 DIAGNOSIS — G63 Polyneuropathy in diseases classified elsewhere: Secondary | ICD-10-CM

## 2014-01-10 DIAGNOSIS — E538 Deficiency of other specified B group vitamins: Secondary | ICD-10-CM

## 2014-01-10 DIAGNOSIS — R072 Precordial pain: Secondary | ICD-10-CM

## 2014-01-10 DIAGNOSIS — F17200 Nicotine dependence, unspecified, uncomplicated: Secondary | ICD-10-CM

## 2014-01-10 DIAGNOSIS — I7300 Raynaud's syndrome without gangrene: Secondary | ICD-10-CM

## 2014-01-10 DIAGNOSIS — K7010 Alcoholic hepatitis without ascites: Secondary | ICD-10-CM

## 2014-01-10 DIAGNOSIS — R739 Hyperglycemia, unspecified: Secondary | ICD-10-CM

## 2014-01-10 DIAGNOSIS — D529 Folate deficiency anemia, unspecified: Secondary | ICD-10-CM

## 2014-01-10 DIAGNOSIS — K219 Gastro-esophageal reflux disease without esophagitis: Secondary | ICD-10-CM

## 2014-01-10 LAB — FOLATE: Folate: 3.6 ng/mL — ABNORMAL LOW (ref >5.9)

## 2014-01-10 LAB — COMPREHENSIVE METABOLIC PANEL
ALBUMIN: 4.3 g/dL (ref 3.5–5.2)
ALK PHOS: 52 U/L (ref 39–117)
ALT: 56 U/L — AB (ref 0–35)
AST: 107 U/L — ABNORMAL HIGH (ref 0–37)
BUN: 7 mg/dL (ref 6–23)
CALCIUM: 9.4 mg/dL (ref 8.4–10.5)
CHLORIDE: 101 meq/L (ref 96–112)
CO2: 28 mEq/L (ref 19–32)
Creatinine, Ser: 0.6 mg/dL (ref 0.4–1.2)
GFR: 122.04 mL/min (ref >60.00)
Glucose, Bld: 88 mg/dL (ref 70–99)
Potassium: 3.8 mEq/L (ref 3.5–5.1)
SODIUM: 141 meq/L (ref 135–145)
Total Bilirubin: 0.5 mg/dL (ref 0.2–1.2)
Total Protein: 8.4 g/dL — ABNORMAL HIGH (ref 6.0–8.3)

## 2014-01-10 LAB — CBC WITH DIFFERENTIAL/PLATELET
BASOS ABS: 0 10*3/uL (ref 0.0–0.1)
Basophils Relative: 0.4 % (ref 0.0–3.0)
Eosinophils Absolute: 0.2 10*3/uL (ref 0.0–0.7)
Eosinophils Relative: 2.2 % (ref 0.0–5.0)
HCT: 40.7 % (ref 36.0–46.0)
Hemoglobin: 13.8 g/dL (ref 12.0–15.0)
LYMPHS PCT: 26.6 % (ref 12.0–46.0)
Lymphs Abs: 2.1 10*3/uL (ref 0.7–4.0)
MCHC: 33.8 g/dL (ref 30.0–36.0)
MCV: 101.1 fl — ABNORMAL HIGH (ref 78.0–100.0)
MONO ABS: 0.6 10*3/uL (ref 0.1–1.0)
Monocytes Relative: 7.7 % (ref 3.0–12.0)
NEUTROS PCT: 63.1 % (ref 43.0–77.0)
Neutro Abs: 5.1 10*3/uL (ref 1.4–7.7)
PLATELETS: 191 10*3/uL (ref 150.0–400.0)
RBC: 4.03 Mil/uL (ref 3.87–5.11)
RDW: 14.3 % (ref 11.5–15.5)
WBC: 8 10*3/uL (ref 4.0–10.5)

## 2014-01-10 LAB — CARDIAC PANEL
CK-MB: 1.5 ng/mL (ref 0.3–4.0)
RELATIVE INDEX: 1.9 calc (ref 0.0–2.5)
Total CK: 78 U/L (ref 7–177)

## 2014-01-10 LAB — VITAMIN B12: Vitamin B-12: 227 pg/mL (ref 211–911)

## 2014-01-10 LAB — TSH: TSH: 0.65 u[IU]/mL (ref 0.35–4.50)

## 2014-01-10 MED ORDER — CYANOCOBALAMIN 500 MCG/0.1ML NA SOLN: 0.1000 mL | NASAL | Status: DC

## 2014-01-10 MED ORDER — FOLIC ACID 1 MG PO TABS: 1.0000 mg | ORAL_TABLET | Freq: Every day | ORAL | Status: DC

## 2014-01-10 MED ORDER — DILTIAZEM HCL ER 120 MG PO CP24: 120.0000 mg | ORAL_CAPSULE | Freq: Every day | ORAL | Status: DC

## 2014-01-10 NOTE — Progress Notes (Signed)
Subjective:    Patient ID: Lisa Long, female    DOB: 13-Apr-1965, 48 y.o.   MRN: [id number]  Chest Pain  This is a recurrent problem. The current episode started more than 1 month ago. The onset quality is gradual. The problem occurs intermittently. The problem has been unchanged. The pain is present in the lateral region (left upper anterior chest area). The pain is at a severity of 2/10. The pain is mild. The quality of the pain is described as burning. The pain does not radiate. Pertinent negatives include no abdominal pain, back pain, claudication, cough, diaphoresis, dizziness, exertional chest pressure, fever, headaches, hemoptysis, irregular heartbeat, leg pain, lower extremity edema, malaise/fatigue, nausea, near-syncope, numbness, orthopnea, palpitations, PND, shortness of breath, sputum production, syncope, vomiting or weakness. The pain is aggravated by emotional upset, breathing and movement. She has tried nothing for the symptoms. The treatment provided no relief.      Review of Systems  Constitutional: Negative.  Negative for fever, chills, malaise/fatigue, diaphoresis, activity change, appetite change, fatigue and unexpected weight change.  HENT: Negative.  Negative for trouble swallowing and voice change.   Eyes: Negative.   Respiratory: Negative.  Negative for apnea, cough, hemoptysis, sputum production, choking, chest tightness, shortness of breath, wheezing and stridor.   Cardiovascular: Positive for chest pain. Negative for palpitations, orthopnea, claudication, leg swelling, syncope, PND and near-syncope.  Gastrointestinal: Negative.  Negative for nausea, vomiting, abdominal pain, diarrhea, constipation and blood in stool.  Endocrine: Negative.  Negative for polyuria.  Genitourinary: Negative.   Musculoskeletal: Negative.  Negative for arthralgias, back pain, gait problem, joint swelling, myalgias, neck pain and neck stiffness.  Skin: Positive for color change. Negative  for rash.       She complains that her hands and fingers turn white when exposed to cold elements, then turn blue and red when she warms them up.  Allergic/Immunologic: Negative.   Neurological: Negative.  Negative for dizziness, weakness, numbness and headaches.  Hematological: Negative.  Negative for adenopathy. Does not bruise/bleed easily.  Psychiatric/Behavioral: Negative.        Objective:   Physical Exam  Vitals reviewed. Constitutional: She is oriented to person, place, and time. She appears well-developed and well-nourished.  Non-toxic appearance. She does not have a sickly appearance. She does not appear ill. No distress.  HENT:  Head: Normocephalic and atraumatic.  Mouth/Throat: Oropharynx is clear and moist. No oropharyngeal exudate.  Eyes: Conjunctivae are normal. Right eye exhibits no discharge. Left eye exhibits no discharge. No scleral icterus.  Neck: Normal range of motion. Neck supple. No JVD present. No tracheal deviation present. No thyromegaly present.  Cardiovascular: Normal rate, regular rhythm, S1 normal, S2 normal, normal heart sounds and intact distal pulses.  Exam reveals no gallop, no S3, no S4 and no friction rub.   No murmur heard. Pulses:      Carotid pulses are 1+ on the right side, and 1+ on the left side.      Radial pulses are 1+ on the right side, and 1+ on the left side.       Femoral pulses are 1+ on the right side, and 1+ on the left side.      Popliteal pulses are 1+ on the right side, and 1+ on the left side.       Dorsalis pedis pulses are 1+ on the right side, and 1+ on the left side.       Posterior tibial pulses are 1+ on the right side, and  1+ on the left side.  Pulmonary/Chest: Effort normal and breath sounds normal. No accessory muscle usage or stridor. Not tachypneic. No respiratory distress. She has no decreased breath sounds. She has no wheezes. She has no rhonchi. She has no rales. She exhibits no tenderness.  Abdominal: Soft. Bowel  sounds are normal. She exhibits no distension and no mass. There is no tenderness. There is no rebound and no guarding.  Musculoskeletal: Normal range of motion. She exhibits no edema and no tenderness.  Lymphadenopathy:    She has no cervical adenopathy.  Neurological: She is oriented to person, place, and time.  Skin: Skin is warm and dry. No rash noted. She is not diaphoretic. No erythema. No pallor.     Lab Results  Component Value Date   WBC 6.1 10/06/2011   HGB 13.8 01/30/2013   HCT 38.9 10/06/2011   PLT 151.0 10/06/2011   GLUCOSE 92 01/26/2013   ALT 74* 10/06/2011   AST 102* 10/06/2011   NA 137 01/26/2013   K 3.8 01/26/2013   CL 97 01/26/2013   CREATININE 0.52 01/26/2013   BUN 9 01/26/2013   CO2 27 01/26/2013   TSH 0.65 02/26/2011   HGBA1C 5.8 10/06/2011       Assessment & Plan:

## 2014-01-10 NOTE — Progress Notes (Signed)
Pre visit review using our clinic review tool, if applicable. No additional management support is needed unless otherwise documented below in the visit note. 

## 2014-01-10 NOTE — Patient Instructions (Signed)

## 2014-01-11 ENCOUNTER — Telehealth: Payer: Self-pay | Admitting: Internal Medicine

## 2014-01-11 LAB — TROPONIN I: Troponin I: <0.01 ng/mL (ref <0.06)

## 2014-01-11 LAB — D-DIMER, QUANTITATIVE: D-Dimer, Quant: 0.27 ug/mL-FEU (ref 0.00–0.48)

## 2014-01-11 NOTE — Telephone Encounter (Signed)
emmi emailed °

## 2014-01-13 NOTE — Assessment & Plan Note (Signed)
Her EKG and CXR are normal All of her cardiac enzymes and d-dimer are normal I think this is MS pain worsened by "stress" She will try tylenol for the pain

## 2014-01-13 NOTE — Assessment & Plan Note (Signed)
I have asked her to start a folate supplement

## 2014-01-13 NOTE — Assessment & Plan Note (Signed)
Her BP is well controlled Will start a CCB for the Raynaud's

## 2014-01-13 NOTE — Assessment & Plan Note (Signed)
Her B12 is borderline low as she has not been compliant I have asked her to RTC for a B12 injection and to start B12 NS

## 2014-01-13 NOTE — Assessment & Plan Note (Addendum)
Will try a CCB for this I have also advised her to stop smoking and drinking alcohol

## 2014-01-13 NOTE — Assessment & Plan Note (Signed)
I have asked her to stop drinking alcohol

## 2014-01-21 ENCOUNTER — Telehealth: Payer: Self-pay | Admitting: Internal Medicine

## 2014-01-21 NOTE — Telephone Encounter (Signed)
Called pt no answer LMOM md has release results to the mychart system did relay md response on her labs & cxr.../lmb

## 2014-01-21 NOTE — Telephone Encounter (Signed)
Patient would like results of chest x ray and blood work

## 2014-03-18 ENCOUNTER — Telehealth: Payer: Self-pay | Admitting: Internal Medicine

## 2014-03-18 NOTE — Telephone Encounter (Signed)
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  She stated that she wants this off her record today.  To said " I want this fixed today so go drag Dr Waynard Reeds out of a room and tell him to fix it today" .  I explain to her that she could setup an appt to talk to dr Ronnald Ramp or she could contact medical records to file an amendment.  She refuses to do either.  She said that she has never been told to stop drinking and she had sarcosis of the liver.  She said if that is what she has then she wants someone to call her and tell her that is what is going.  She said that she works from [employer] and she doesn't want that on her record.  Tried to help her and guide to all option to help her and she refused.  Just wanted me to take it off.  Told her I could not would have to sent to [place].

## 2014-04-05 HISTORY — PX: ESOPHAGOGASTRODUODENOSCOPY (EGD) WITH ESOPHAGEAL DILATION: SHX5812

## 2014-07-23 ENCOUNTER — Ambulatory Visit (INDEPENDENT_AMBULATORY_CARE_PROVIDER_SITE_OTHER): Payer: 59 | Admitting: Internal Medicine

## 2014-07-23 ENCOUNTER — Encounter: Payer: Self-pay | Admitting: Internal Medicine

## 2014-07-23 VITALS — BP 142/94 | HR 114 | Temp 98.4°F | Resp 18 | Wt 122.0 lb

## 2014-07-23 DIAGNOSIS — I7300 Raynaud's syndrome without gangrene: Secondary | ICD-10-CM

## 2014-07-23 DIAGNOSIS — Z23 Encounter for immunization: Secondary | ICD-10-CM

## 2014-07-23 DIAGNOSIS — K7010 Alcoholic hepatitis without ascites: Secondary | ICD-10-CM

## 2014-07-23 DIAGNOSIS — Z0000 Encounter for general adult medical examination without abnormal findings: Secondary | ICD-10-CM

## 2014-07-23 DIAGNOSIS — K222 Esophageal obstruction: Secondary | ICD-10-CM

## 2014-07-23 DIAGNOSIS — R1314 Dysphagia, pharyngoesophageal phase: Secondary | ICD-10-CM | POA: Diagnosis not present

## 2014-07-23 DIAGNOSIS — I10 Essential (primary) hypertension: Secondary | ICD-10-CM

## 2014-07-23 DIAGNOSIS — Z1231 Encounter for screening mammogram for malignant neoplasm of breast: Secondary | ICD-10-CM | POA: Insufficient documentation

## 2014-07-23 HISTORY — DX: Esophageal obstruction: K22.2

## 2014-07-23 MED ORDER — DILTIAZEM HCL ER 120 MG PO CP24: 120.0000 mg | ORAL_CAPSULE | Freq: Every day | ORAL | Status: DC

## 2014-07-23 NOTE — Patient Instructions (Signed)
Preventive Care for Adults A healthy lifestyle and preventive care can promote health and wellness. Preventive health guidelines for women include the following key practices.  A routine yearly physical is a good way to check with your health care provider about your health and preventive screening. It is a chance to share any concerns and updates on your health and to receive a thorough exam.  Visit your dentist for a routine exam and preventive care every 6 months. Brush your teeth twice a day and floss once a day. Good oral hygiene prevents tooth decay and gum disease.  The frequency of eye exams is based on your age, health, family medical history, use of contact lenses, and other factors. Follow your health care provider's recommendations for frequency of eye exams.  Eat a healthy diet. Foods like vegetables, fruits, whole grains, low-fat dairy products, and lean protein foods contain the nutrients you need without too many calories. Decrease your intake of foods high in solid fats, added sugars, and salt. Eat the right amount of calories for you.Get information about a proper diet from your health care provider, if necessary.  Regular physical exercise is one of the most important things you can do for your health. Most adults should get at least 150 minutes of moderate-intensity exercise (any activity that increases your heart rate and causes you to sweat) each week. In addition, most adults need muscle-strengthening exercises on 2 or more days a week.  Maintain a healthy weight. The body mass index (BMI) is a screening tool to identify possible weight problems. It provides an estimate of body fat based on height and weight. Your health care provider can find your BMI and can help you achieve or maintain a healthy weight.For adults 20 years and older:  A BMI below 18.5 is considered underweight.  A BMI of 18.5 to 24.9 is normal.  A BMI of 25 to 29.9 is considered overweight.  A BMI of  30 and above is considered obese.  Maintain normal blood lipids and cholesterol levels by exercising and minimizing your intake of saturated fat. Eat a balanced diet with plenty of fruit and vegetables. Blood tests for lipids and cholesterol should begin at age 76 and be repeated every 5 years. If your lipid or cholesterol levels are high, you are over 50, or you are at high risk for heart disease, you may need your cholesterol levels checked more frequently.Ongoing high lipid and cholesterol levels should be treated with medicines if diet and exercise are not working.  If you smoke, find out from your health care provider how to quit. If you do not use tobacco, do not start.  Lung cancer screening is recommended for adults aged 22-80 years who are at high risk for developing lung cancer because of a history of smoking. A yearly low-dose CT scan of the lungs is recommended for people who have at least a 30-pack-year history of smoking and are a current smoker or have quit within the past 15 years. A pack year of smoking is smoking an average of 1 pack of cigarettes a day for 1 year (for example: 1 pack a day for 30 years or 2 packs a day for 15 years). Yearly screening should continue until the smoker has stopped smoking for at least 15 years. Yearly screening should be stopped for people who develop a health problem that would prevent them from having lung cancer treatment.  If you are pregnant, do not drink alcohol. If you are breastfeeding,  be very cautious about drinking alcohol. If you are not pregnant and choose to drink alcohol, do not have more than 1 drink per day. One drink is considered to be 12 ounces (355 mL) of beer, 5 ounces (148 mL) of wine, or 1.5 ounces (44 mL) of liquor.  Avoid use of street drugs. Do not share needles with anyone. Ask for help if you need support or instructions about stopping the use of drugs.  High blood pressure causes heart disease and increases the risk of  stroke. Your blood pressure should be checked at least every 1 to 2 years. Ongoing high blood pressure should be treated with medicines if weight loss and exercise do not work.  If you are 75-52 years old, ask your health care provider if you should take aspirin to prevent strokes.  Diabetes screening involves taking a blood sample to check your fasting blood sugar level. This should be done once every 3 years, after age 15, if you are within normal weight and without risk factors for diabetes. Testing should be considered at a younger age or be carried out more frequently if you are overweight and have at least 1 risk factor for diabetes.  Breast cancer screening is essential preventive care for women. You should practice "breast self-awareness." This means understanding the normal appearance and feel of your breasts and may include breast self-examination. Any changes detected, no matter how small, should be reported to a health care provider. Women in their 58s and 30s should have a clinical breast exam (CBE) by a health care provider as part of a regular health exam every 1 to 3 years. After age 16, women should have a CBE every year. Starting at age 53, women should consider having a mammogram (breast X-ray test) every year. Women who have a family history of breast cancer should talk to their health care provider about genetic screening. Women at a high risk of breast cancer should talk to their health care providers about having an MRI and a mammogram every year.  Breast cancer gene (BRCA)-related cancer risk assessment is recommended for women who have family members with BRCA-related cancers. BRCA-related cancers include breast, ovarian, tubal, and peritoneal cancers. Having family members with these cancers may be associated with an increased risk for harmful changes (mutations) in the breast cancer genes BRCA1 and BRCA2. Results of the assessment will determine the need for genetic counseling and  BRCA1 and BRCA2 testing.  Routine pelvic exams to screen for cancer are no longer recommended for nonpregnant women who are considered low risk for cancer of the pelvic organs (ovaries, uterus, and vagina) and who do not have symptoms. Ask your health care provider if a screening pelvic exam is right for you.  If you have had past treatment for cervical cancer or a condition that could lead to cancer, you need Pap tests and screening for cancer for at least 20 years after your treatment. If Pap tests have been discontinued, your risk factors (such as having a new sexual partner) need to be reassessed to determine if screening should be resumed. Some women have medical problems that increase the chance of getting cervical cancer. In these cases, your health care provider may recommend more frequent screening and Pap tests.  The HPV test is an additional test that may be used for cervical cancer screening. The HPV test looks for the virus that can cause the cell changes on the cervix. The cells collected during the Pap test can be  tested for HPV. The HPV test could be used to screen women aged 30 years and older, and should be used in women of any age who have unclear Pap test results. After the age of 30, women should have HPV testing at the same frequency as a Pap test.  Colorectal cancer can be detected and often prevented. Most routine colorectal cancer screening begins at the age of 50 years and continues through age 75 years. However, your health care provider may recommend screening at an earlier age if you have risk factors for colon cancer. On a yearly basis, your health care provider may provide home test kits to check for hidden blood in the stool. Use of a small camera at the end of a tube, to directly examine the colon (sigmoidoscopy or colonoscopy), can detect the earliest forms of colorectal cancer. Talk to your health care provider about this at age 50, when routine screening begins. Direct  exam of the colon should be repeated every 5-10 years through age 75 years, unless early forms of pre-cancerous polyps or small growths are found.  People who are at an increased risk for hepatitis B should be screened for this virus. You are considered at high risk for hepatitis B if:  You were born in a country where hepatitis B occurs often. Talk with your health care provider about which countries are considered high risk.  Your parents were born in a high-risk country and you have not received a shot to protect against hepatitis B (hepatitis B vaccine).  You have HIV or AIDS.  You use needles to inject street drugs.  You live with, or have sex with, someone who has hepatitis B.  You get hemodialysis treatment.  You take certain medicines for conditions like cancer, organ transplantation, and autoimmune conditions.  Hepatitis C blood testing is recommended for all people born from 1945 through 1965 and any individual with known risks for hepatitis C.  Practice safe sex. Use condoms and avoid high-risk sexual practices to reduce the spread of sexually transmitted infections (STIs). STIs include gonorrhea, chlamydia, syphilis, trichomonas, herpes, HPV, and human immunodeficiency virus (HIV). Herpes, HIV, and HPV are viral illnesses that have no cure. They can result in disability, cancer, and death.  You should be screened for sexually transmitted illnesses (STIs) including gonorrhea and chlamydia if:  You are sexually active and are younger than 24 years.  You are older than 24 years and your health care provider tells you that you are at risk for this type of infection.  Your sexual activity has changed since you were last screened and you are at an increased risk for chlamydia or gonorrhea. Ask your health care provider if you are at risk.  If you are at risk of being infected with HIV, it is recommended that you take a prescription medicine daily to prevent HIV infection. This is  called preexposure prophylaxis (PrEP). You are considered at risk if:  You are a heterosexual woman, are sexually active, and are at increased risk for HIV infection.  You take drugs by injection.  You are sexually active with a partner who has HIV.  Talk with your health care provider about whether you are at high risk of being infected with HIV. If you choose to begin PrEP, you should first be tested for HIV. You should then be tested every 3 months for as long as you are taking PrEP.  Osteoporosis is a disease in which the bones lose minerals and strength   with aging. This can result in serious bone fractures or breaks. The risk of osteoporosis can be identified using a bone density scan. Women ages 65 years and over and women at risk for fractures or osteoporosis should discuss screening with their health care providers. Ask your health care provider whether you should take a calcium supplement or vitamin D to reduce the rate of osteoporosis.  Menopause can be associated with physical symptoms and risks. Hormone replacement therapy is available to decrease symptoms and risks. You should talk to your health care provider about whether hormone replacement therapy is right for you.  Use sunscreen. Apply sunscreen liberally and repeatedly throughout the day. You should seek shade when your shadow is shorter than you. Protect yourself by wearing long sleeves, pants, a wide-brimmed hat, and sunglasses year round, whenever you are outdoors.  Once a month, do a whole body skin exam, using a mirror to look at the skin on your back. Tell your health care provider of new moles, moles that have irregular borders, moles that are larger than a pencil eraser, or moles that have changed in shape or color.  Stay current with required vaccines (immunizations).  Influenza vaccine. All adults should be immunized every year.  Tetanus, diphtheria, and acellular pertussis (Td, Tdap) vaccine. Pregnant women should  receive 1 dose of Tdap vaccine during each pregnancy. The dose should be obtained regardless of the length of time since the last dose. Immunization is preferred during the 27th-36th week of gestation. An adult who has not previously received Tdap or who does not know her vaccine status should receive 1 dose of Tdap. This initial dose should be followed by tetanus and diphtheria toxoids (Td) booster doses every 10 years. Adults with an unknown or incomplete history of completing a 3-dose immunization series with Td-containing vaccines should begin or complete a primary immunization series including a Tdap dose. Adults should receive a Td booster every 10 years.  Varicella vaccine. An adult without evidence of immunity to varicella should receive 2 doses or a second dose if she has previously received 1 dose. Pregnant females who do not have evidence of immunity should receive the first dose after pregnancy. This first dose should be obtained before leaving the health care facility. The second dose should be obtained 4-8 weeks after the first dose.  Human papillomavirus (HPV) vaccine. Females aged 13-26 years who have not received the vaccine previously should obtain the 3-dose series. The vaccine is not recommended for use in pregnant females. However, pregnancy testing is not needed before receiving a dose. If a female is found to be pregnant after receiving a dose, no treatment is needed. In that case, the remaining doses should be delayed until after the pregnancy. Immunization is recommended for any person with an immunocompromised condition through the age of 26 years if she did not get any or all doses earlier. During the 3-dose series, the second dose should be obtained 4-8 weeks after the first dose. The third dose should be obtained 24 weeks after the first dose and 16 weeks after the second dose.  Zoster vaccine. One dose is recommended for adults aged 60 years or older unless certain conditions are  present.  Measles, mumps, and rubella (MMR) vaccine. Adults born before 1957 generally are considered immune to measles and mumps. Adults born in 1957 or later should have 1 or more doses of MMR vaccine unless there is a contraindication to the vaccine or there is laboratory evidence of immunity to   each of the three diseases. A routine second dose of MMR vaccine should be obtained at least 28 days after the first dose for students attending postsecondary schools, health care workers, or international travelers. People who received inactivated measles vaccine or an unknown type of measles vaccine during 1963-1967 should receive 2 doses of MMR vaccine. People who received inactivated mumps vaccine or an unknown type of mumps vaccine before 1979 and are at high risk for mumps infection should consider immunization with 2 doses of MMR vaccine. For females of childbearing age, rubella immunity should be determined. If there is no evidence of immunity, females who are not pregnant should be vaccinated. If there is no evidence of immunity, females who are pregnant should delay immunization until after pregnancy. Unvaccinated health care workers born before 1957 who lack laboratory evidence of measles, mumps, or rubella immunity or laboratory confirmation of disease should consider measles and mumps immunization with 2 doses of MMR vaccine or rubella immunization with 1 dose of MMR vaccine.  Pneumococcal 13-valent conjugate (PCV13) vaccine. When indicated, a person who is uncertain of her immunization history and has no record of immunization should receive the PCV13 vaccine. An adult aged 19 years or older who has certain medical conditions and has not been previously immunized should receive 1 dose of PCV13 vaccine. This PCV13 should be followed with a dose of pneumococcal polysaccharide (PPSV23) vaccine. The PPSV23 vaccine dose should be obtained at least 8 weeks after the dose of PCV13 vaccine. An adult aged 19  years or older who has certain medical conditions and previously received 1 or more doses of PPSV23 vaccine should receive 1 dose of PCV13. The PCV13 vaccine dose should be obtained 1 or more years after the last PPSV23 vaccine dose.  Pneumococcal polysaccharide (PPSV23) vaccine. When PCV13 is also indicated, PCV13 should be obtained first. All adults aged 65 years and older should be immunized. An adult younger than age 65 years who has certain medical conditions should be immunized. Any person who resides in a nursing home or long-term care facility should be immunized. An adult smoker should be immunized. People with an immunocompromised condition and certain other conditions should receive both PCV13 and PPSV23 vaccines. People with human immunodeficiency virus (HIV) infection should be immunized as soon as possible after diagnosis. Immunization during chemotherapy or radiation therapy should be avoided. Routine use of PPSV23 vaccine is not recommended for American Indians, [place] Natives, or people younger than 65 years unless there are medical conditions that require PPSV23 vaccine. When indicated, people who have unknown immunization and have no record of immunization should receive PPSV23 vaccine. One-time revaccination 5 years after the first dose of PPSV23 is recommended for people aged 19-64 years who have chronic kidney failure, nephrotic syndrome, asplenia, or immunocompromised conditions. People who received 1-2 doses of PPSV23 before age 65 years should receive another dose of PPSV23 vaccine at age 65 years or later if at least 5 years have passed since the previous dose. Doses of PPSV23 are not needed for people immunized with PPSV23 at or after age 65 years.  Meningococcal vaccine. Adults with asplenia or persistent complement component deficiencies should receive 2 doses of quadrivalent meningococcal conjugate (MenACWY-D) vaccine. The doses should be obtained at least 2 months apart.  Microbiologists working with certain meningococcal bacteria, military recruits, people at risk during an outbreak, and people who travel to or live in countries with a high rate of meningitis should be immunized. A first-year college student up through age   21 years who is living in a residence hall should receive a dose if she did not receive a dose on or after her 16th birthday. Adults who have certain high-risk conditions should receive one or more doses of vaccine.  Hepatitis A vaccine. Adults who wish to be protected from this disease, have certain high-risk conditions, work with hepatitis A-infected animals, work in hepatitis A research labs, or travel to or work in countries with a high rate of hepatitis A should be immunized. Adults who were previously unvaccinated and who anticipate close contact with an international adoptee during the first 60 days after arrival in the [place] from a country with a high rate of hepatitis A should be immunized.  Hepatitis B vaccine. Adults who wish to be protected from this disease, have certain high-risk conditions, may be exposed to blood or other infectious body fluids, are household contacts or sex partners of hepatitis B positive people, are clients or workers in certain care facilities, or travel to or work in countries with a high rate of hepatitis B should be immunized.  Haemophilus influenzae type b (Hib) vaccine. A previously unvaccinated person with asplenia or sickle cell disease or having a scheduled splenectomy should receive 1 dose of Hib vaccine. Regardless of previous immunization, a recipient of a hematopoietic stem cell transplant should receive a 3-dose series 6-12 months after her successful transplant. Hib vaccine is not recommended for adults with HIV infection. Preventive Services / Frequency Ages 64 to 68 years  Blood pressure check.** / Every 1 to 2 years.  Lipid and cholesterol check.** / Every 5 years beginning at age  22.  Clinical breast exam.** / Every 3 years for women in their 88s and 53s.  BRCA-related cancer risk assessment.** / For women who have family members with a BRCA-related cancer (breast, ovarian, tubal, or peritoneal cancers).  Pap test.** / Every 2 years from ages 90 through 51. Every 3 years starting at age 21 through age 56 or 3 with a history of 3 consecutive normal Pap tests.  HPV screening.** / Every 3 years from ages 24 through ages 1 to 46 with a history of 3 consecutive normal Pap tests.  Hepatitis C blood test.** / For any individual with known risks for hepatitis C.  Skin self-exam. / Monthly.  Influenza vaccine. / Every year.  Tetanus, diphtheria, and acellular pertussis (Tdap, Td) vaccine.** / Consult your health care provider. Pregnant women should receive 1 dose of Tdap vaccine during each pregnancy. 1 dose of Td every 10 years.  Varicella vaccine.** / Consult your health care provider. Pregnant females who do not have evidence of immunity should receive the first dose after pregnancy.  HPV vaccine. / 3 doses over 6 months, if 72 and younger. The vaccine is not recommended for use in pregnant females. However, pregnancy testing is not needed before receiving a dose.  Measles, mumps, rubella (MMR) vaccine.** / You need at least 1 dose of MMR if you were born in 1957 or later. You may also need a 2nd dose. For females of childbearing age, rubella immunity should be determined. If there is no evidence of immunity, females who are not pregnant should be vaccinated. If there is no evidence of immunity, females who are pregnant should delay immunization until after pregnancy.  Pneumococcal 13-valent conjugate (PCV13) vaccine.** / Consult your health care provider.  Pneumococcal polysaccharide (PPSV23) vaccine.** / 1 to 2 doses if you smoke cigarettes or if you have certain conditions.  Meningococcal vaccine.** /  1 dose if you are age 19 to 21 years and a first-year college  student living in a residence hall, or have one of several medical conditions, you need to get vaccinated against meningococcal disease. You may also need additional booster doses.  Hepatitis A vaccine.** / Consult your health care provider.  Hepatitis B vaccine.** / Consult your health care provider.  Haemophilus influenzae type b (Hib) vaccine.** / Consult your health care provider. Ages 40 to 64 years  Blood pressure check.** / Every 1 to 2 years.  Lipid and cholesterol check.** / Every 5 years beginning at age 20 years.  Lung cancer screening. / Every year if you are aged 55-80 years and have a 30-pack-year history of smoking and currently smoke or have quit within the past 15 years. Yearly screening is stopped once you have quit smoking for at least 15 years or develop a health problem that would prevent you from having lung cancer treatment.  Clinical breast exam.** / Every year after age 40 years.  BRCA-related cancer risk assessment.** / For women who have family members with a BRCA-related cancer (breast, ovarian, tubal, or peritoneal cancers).  Mammogram.** / Every year beginning at age 40 years and continuing for as long as you are in good health. Consult with your health care provider.  Pap test.** / Every 3 years starting at age 30 years through age 65 or 70 years with a history of 3 consecutive normal Pap tests.  HPV screening.** / Every 3 years from ages 30 years through ages 65 to 70 years with a history of 3 consecutive normal Pap tests.  Fecal occult blood test (FOBT) of stool. / Every year beginning at age 50 years and continuing until age 75 years. You may not need to do this test if you get a colonoscopy every 10 years.  Flexible sigmoidoscopy or colonoscopy.** / Every 5 years for a flexible sigmoidoscopy or every 10 years for a colonoscopy beginning at age 50 years and continuing until age 75 years.  Hepatitis C blood test.** / For all people born from 1945 through  1965 and any individual with known risks for hepatitis C.  Skin self-exam. / Monthly.  Influenza vaccine. / Every year.  Tetanus, diphtheria, and acellular pertussis (Tdap/Td) vaccine.** / Consult your health care provider. Pregnant women should receive 1 dose of Tdap vaccine during each pregnancy. 1 dose of Td every 10 years.  Varicella vaccine.** / Consult your health care provider. Pregnant females who do not have evidence of immunity should receive the first dose after pregnancy.  Zoster vaccine.** / 1 dose for adults aged 60 years or older.  Measles, mumps, rubella (MMR) vaccine.** / You need at least 1 dose of MMR if you were born in 1957 or later. You may also need a 2nd dose. For females of childbearing age, rubella immunity should be determined. If there is no evidence of immunity, females who are not pregnant should be vaccinated. If there is no evidence of immunity, females who are pregnant should delay immunization until after pregnancy.  Pneumococcal 13-valent conjugate (PCV13) vaccine.** / Consult your health care provider.  Pneumococcal polysaccharide (PPSV23) vaccine.** / 1 to 2 doses if you smoke cigarettes or if you have certain conditions.  Meningococcal vaccine.** / Consult your health care provider.  Hepatitis A vaccine.** / Consult your health care provider.  Hepatitis B vaccine.** / Consult your health care provider.  Haemophilus influenzae type b (Hib) vaccine.** / Consult your health care provider. Ages 65   years and over  Blood pressure check.** / Every 1 to 2 years.  Lipid and cholesterol check.** / Every 5 years beginning at age 43 years.  Lung cancer screening. / Every year if you are aged 35-80 years and have a 30-pack-year history of smoking and currently smoke or have quit within the past 15 years. Yearly screening is stopped once you have quit smoking for at least 15 years or develop a health problem that would prevent you from having lung cancer  treatment.  Clinical breast exam.** / Every year after age 46 years.  BRCA-related cancer risk assessment.** / For women who have family members with a BRCA-related cancer (breast, ovarian, tubal, or peritoneal cancers).  Mammogram.** / Every year beginning at age 11 years and continuing for as long as you are in good health. Consult with your health care provider.  Pap test.** / Every 3 years starting at age 16 years through age 46 or 64 years with 3 consecutive normal Pap tests. Testing can be stopped between 65 and 70 years with 3 consecutive normal Pap tests and no abnormal Pap or HPV tests in the past 10 years.  HPV screening.** / Every 3 years from ages 50 years through ages 87 or 38 years with a history of 3 consecutive normal Pap tests. Testing can be stopped between 65 and 70 years with 3 consecutive normal Pap tests and no abnormal Pap or HPV tests in the past 10 years.  Fecal occult blood test (FOBT) of stool. / Every year beginning at age 26 years and continuing until age 76 years. You may not need to do this test if you get a colonoscopy every 10 years.  Flexible sigmoidoscopy or colonoscopy.** / Every 5 years for a flexible sigmoidoscopy or every 10 years for a colonoscopy beginning at age 64 years and continuing until age 58 years.  Hepatitis C blood test.** / For all people born from [birth year] through 1966 and any individual with known risks for hepatitis C.  Osteoporosis screening.** / A one-time screening for women ages 25 years and over and women at risk for fractures or osteoporosis.  Skin self-exam. / Monthly.  Influenza vaccine. / Every year.  Tetanus, diphtheria, and acellular pertussis (Tdap/Td) vaccine.** / 1 dose of Td every 10 years.  Varicella vaccine.** / Consult your health care provider.  Zoster vaccine.** / 1 dose for adults aged 62 years or older.  Pneumococcal 13-valent conjugate (PCV13) vaccine.** / Consult your health care provider.  Pneumococcal  polysaccharide (PPSV23) vaccine.** / 1 dose for all adults aged 45 years and older.  Meningococcal vaccine.** / Consult your health care provider.  Hepatitis A vaccine.** / Consult your health care provider.  Hepatitis B vaccine.** / Consult your health care provider.  Haemophilus influenzae type b (Hib) vaccine.** / Consult your health care provider. ** Family history and personal history of risk and conditions may change your health care provider's recommendations. Document Released: 05/18/2001 Document Revised: 08/06/2013 Document Reviewed: 08/17/2010 [facility] Patient Information 2015 [place], [place]. This information is not intended to replace advice given to you by your health care provider. Make sure you discuss any questions you have with your health care provider. Hypertension Hypertension, commonly called high blood pressure, is when the force of blood pumping through your arteries is too strong. Your arteries are the blood vessels that carry blood from your heart throughout your body. A blood pressure reading consists of a higher number over a lower number, such as 110/72. The higher number (  systolic) is the pressure inside your arteries when your heart pumps. The lower number (diastolic) is the pressure inside your arteries when your heart relaxes. Ideally you want your blood pressure below 120/80. Hypertension forces your heart to work harder to pump blood. Your arteries may become narrow or stiff. Having hypertension puts you at risk for heart disease, stroke, and other problems.  RISK FACTORS Some risk factors for high blood pressure are controllable. Others are not.  Risk factors you cannot control include:   Race. You may be at higher risk if you are African American.  Age. Risk increases with age.  Gender. Men are at higher risk than women before age 35 years. After age 25, women are at higher risk than men. Risk factors you can control include:  Not getting enough exercise  or physical activity.  Being overweight.  Getting too much fat, sugar, calories, or salt in your diet.  Drinking too much alcohol. SIGNS AND SYMPTOMS Hypertension does not usually cause signs or symptoms. Extremely high blood pressure (hypertensive crisis) may cause headache, anxiety, shortness of breath, and nosebleed. DIAGNOSIS  To check if you have hypertension, your health care provider will measure your blood pressure while you are seated, with your arm held at the level of your heart. It should be measured at least twice using the same arm. Certain conditions can cause a difference in blood pressure between your right and left arms. A blood pressure reading that is higher than normal on one occasion does not mean that you need treatment. If one blood pressure reading is high, ask your health care provider about having it checked again. TREATMENT  Treating high blood pressure includes making lifestyle changes and possibly taking medicine. Living a healthy lifestyle can help lower high blood pressure. You may need to change some of your habits. Lifestyle changes may include:  Following the DASH diet. This diet is high in fruits, vegetables, and whole grains. It is low in salt, red meat, and added sugars.  Getting at least 2 hours of brisk physical activity every week.  Losing weight if necessary.  Not smoking.  Limiting alcoholic beverages.  Learning ways to reduce stress. If lifestyle changes are not enough to get your blood pressure under control, your health care provider may prescribe medicine. You may need to take more than one. Work closely with your health care provider to understand the risks and benefits. HOME CARE INSTRUCTIONS  Have your blood pressure rechecked as directed by your health care provider.   Take medicines only as directed by your health care provider. Follow the directions carefully. Blood pressure medicines must be taken as prescribed. The medicine does  not work as well when you skip doses. Skipping doses also puts you at risk for problems.   Do not smoke.   Monitor your blood pressure at home as directed by your health care provider. SEEK MEDICAL CARE IF:   You think you are having a reaction to medicines taken.  You have recurrent headaches or feel dizzy.  You have swelling in your ankles.  You have trouble with your vision. SEEK IMMEDIATE MEDICAL CARE IF:  You develop a severe headache or confusion.  You have unusual weakness, numbness, or feel faint.  You have severe chest or abdominal pain.  You vomit repeatedly.  You have trouble breathing. MAKE SURE YOU:   Understand these instructions.  Will watch your condition.  Will get help right away if you are not doing well or get  worse. Document Released: 03/22/2005 Document Revised: 08/06/2013 Document Reviewed: 01/12/2013 [facility] Patient Information 2015 [place], [place]. This information is not intended to replace advice given to you by your health care provider. Make sure you discuss any questions you have with your health care provider.

## 2014-07-23 NOTE — Progress Notes (Signed)
Subjective:    Patient ID: Lisa Long, female    DOB: 03/16/1966, 49 y.o.   MRN: [id number]  Hypertension This is a chronic problem. The current episode started more than 1 year ago. The problem has been gradually worsening since onset. The problem is uncontrolled. Associated symptoms include anxiety and neck pain. Pertinent negatives include no blurred vision, chest pain, headaches, malaise/fatigue, orthopnea, palpitations, peripheral edema, PND, shortness of breath or sweats. There are no associated agents to hypertension. Risk factors for coronary artery disease include smoking/tobacco exposure. Past treatments include central alpha agonists and calcium channel blockers. The current treatment provides mild improvement. Compliance problems include psychosocial issues (she has not taking the CCB).       Review of Systems  Constitutional: Positive for fatigue. Negative for fever, chills, malaise/fatigue, diaphoresis, activity change, appetite change and unexpected weight change.  HENT: Positive for trouble swallowing.        She complains that food gets stuck in the upper part of her esophagus  Eyes: Negative.  Negative for blurred vision.  Respiratory: Negative.  Negative for cough, choking, chest tightness, shortness of breath and stridor.   Cardiovascular: Negative.  Negative for chest pain, palpitations, orthopnea, leg swelling and PND.  Gastrointestinal: Negative.  Negative for nausea, vomiting, abdominal pain, diarrhea, constipation, blood in stool, abdominal distention, anal bleeding and rectal pain.  Endocrine: Negative.   Genitourinary: Negative.   Musculoskeletal: Positive for neck pain. Negative for myalgias, back pain, joint swelling, arthralgias, gait problem and neck stiffness.       She has chronic neck pain and she tells me that Dr. Jannifer Franklin has done an MRI on her C-spine and that she took a course of steroids and the pain improved.  Skin: Negative.   Allergic/Immunologic:  Negative.   Neurological: Negative.  Negative for dizziness, syncope, speech difficulty, weakness, light-headedness, numbness and headaches.  Hematological: Negative.  Negative for adenopathy. Does not bruise/bleed easily.  Psychiatric/Behavioral: Positive for dysphoric mood. Negative for suicidal ideas, hallucinations, behavioral problems, confusion, sleep disturbance, self-injury, decreased concentration and agitation. The patient is nervous/anxious. The patient is not hyperactive.        Objective:   Physical Exam  Constitutional: She is oriented to person, place, and time. She appears well-developed and well-nourished. No distress.  HENT:  Head: Normocephalic and atraumatic.  Mouth/Throat: Oropharynx is clear and moist. No oropharyngeal exudate.  Eyes: Conjunctivae are normal. Right eye exhibits no discharge. Left eye exhibits no discharge. No scleral icterus.  Neck: Normal range of motion. Neck supple. No JVD present. No tracheal deviation present. No thyromegaly present.  Cardiovascular: Normal rate, regular rhythm, normal heart sounds and intact distal pulses.  Exam reveals no gallop and no friction rub.   No murmur heard. Pulmonary/Chest: Effort normal and breath sounds normal. No stridor. No respiratory distress. She has no wheezes. She has no rales. She exhibits no tenderness.  Abdominal: Soft. Bowel sounds are normal. She exhibits no distension and no mass. There is no tenderness. There is no rebound and no guarding.  Musculoskeletal: Normal range of motion. She exhibits no edema or tenderness.  Lymphadenopathy:    She has no cervical adenopathy.  Neurological: She is oriented to person, place, and time.  Skin: Skin is warm and dry. No rash noted. She is not diaphoretic. No erythema. No pallor.  Psychiatric: Her speech is normal and behavior is normal. Judgment normal. Her mood appears anxious. Her affect is not angry, not blunt, not labile and not inappropriate. She is  not slowed  and not withdrawn. Cognition and memory are normal. She exhibits a depressed mood. She expresses no homicidal and no suicidal ideation. She expresses no suicidal plans and no homicidal plans.  She is crying off and on today but states that she is not depressed  Vitals reviewed.    Lab Results  Component Value Date   WBC 8.0 01/10/2014   HGB 13.8 01/10/2014   HCT 40.7 01/10/2014   PLT 191.0 01/10/2014   GLUCOSE 88 01/10/2014   ALT 56* 01/10/2014   AST 107* 01/10/2014   NA 141 01/10/2014   K 3.8 01/10/2014   CL 101 01/10/2014   CREATININE 0.6 01/10/2014   BUN 7 01/10/2014   CO2 28 01/10/2014   TSH 0.65 01/10/2014   HGBA1C 5.8 10/06/2011       Assessment & Plan:

## 2014-07-23 NOTE — Progress Notes (Signed)
Pre visit review using our clinic review tool, if applicable. No additional management support is needed unless otherwise documented below in the visit note. 

## 2014-07-24 ENCOUNTER — Encounter: Payer: Self-pay | Admitting: Physician Assistant

## 2014-07-24 NOTE — Assessment & Plan Note (Signed)
She is in denial about this She is not willing to talk about an abstinence program from alcohol Will recheck her LFT's today

## 2014-07-24 NOTE — Assessment & Plan Note (Signed)
Vaccines were reviewed and updated She was referred for a mammogram Exam done Labs ordered Pt ed material was given

## 2014-07-24 NOTE — Assessment & Plan Note (Signed)
She has risk factors for UGI pathology so I have asked her to see GI to see if upper endoscopy is indicated

## 2014-07-24 NOTE — Assessment & Plan Note (Signed)
Her BP is not well controlled I have asked her to restart the CCB Will check her labs to screen for secondary causes of HTN and to look for end organ damage I asked her to quit smoking

## 2014-07-26 ENCOUNTER — Other Ambulatory Visit (INDEPENDENT_AMBULATORY_CARE_PROVIDER_SITE_OTHER): Payer: 59

## 2014-07-26 ENCOUNTER — Encounter: Payer: Self-pay | Admitting: Physician Assistant

## 2014-07-26 DIAGNOSIS — Z0000 Encounter for general adult medical examination without abnormal findings: Secondary | ICD-10-CM | POA: Diagnosis not present

## 2014-07-26 LAB — COMPREHENSIVE METABOLIC PANEL
ALT: 38 U/L — AB (ref 0–35)
AST: 53 U/L — ABNORMAL HIGH (ref 0–37)
Albumin: 4.9 g/dL (ref 3.5–5.2)
Alkaline Phosphatase: 61 U/L (ref 39–117)
BILIRUBIN TOTAL: 0.6 mg/dL (ref 0.2–1.2)
BUN: 17 mg/dL (ref 6–23)
CO2: 31 meq/L (ref 19–32)
Calcium: 10.3 mg/dL (ref 8.4–10.5)
Chloride: 94 mEq/L — ABNORMAL LOW (ref 96–112)
Creatinine, Ser: 0.93 mg/dL (ref 0.40–1.20)
GFR: 67.82 mL/min (ref >60.00)
Glucose, Bld: 87 mg/dL (ref 70–99)
POTASSIUM: 3.8 meq/L (ref 3.5–5.1)
SODIUM: 134 meq/L — AB (ref 135–145)
TOTAL PROTEIN: 8.2 g/dL (ref 6.0–8.3)

## 2014-07-26 LAB — URINALYSIS, ROUTINE W REFLEX MICROSCOPIC
Hgb urine dipstick: NEGATIVE
KETONES UR: 15 — AB
Leukocytes, UA: NEGATIVE
NITRITE: NEGATIVE
Specific Gravity, Urine: >=1.03 — AB (ref 1.000–1.030)
Urine Glucose: NEGATIVE
Urobilinogen, UA: 0.2 (ref 0.0–1.0)
pH: 5.5 (ref 5.0–8.0)

## 2014-07-26 LAB — CBC WITH DIFFERENTIAL/PLATELET
BASOS ABS: 0 10*3/uL (ref 0.0–0.1)
Basophils Relative: 0.5 % (ref 0.0–3.0)
EOS ABS: 0.1 10*3/uL (ref 0.0–0.7)
EOS PCT: 1.4 % (ref 0.0–5.0)
HCT: 39.9 % (ref 36.0–46.0)
Hemoglobin: 13.6 g/dL (ref 12.0–15.0)
Lymphocytes Relative: 20.2 % (ref 12.0–46.0)
Lymphs Abs: 1.8 10*3/uL (ref 0.7–4.0)
MCHC: 34 g/dL (ref 30.0–36.0)
MCV: 97.4 fl (ref 78.0–100.0)
MONO ABS: 0.7 10*3/uL (ref 0.1–1.0)
Monocytes Relative: 7.5 % (ref 3.0–12.0)
Neutro Abs: 6.3 10*3/uL (ref 1.4–7.7)
Neutrophils Relative %: 70.4 % (ref 43.0–77.0)
Platelets: 175 10*3/uL (ref 150.0–400.0)
RBC: 4.1 Mil/uL (ref 3.87–5.11)
RDW: 13.9 % (ref 11.5–15.5)
WBC: 9 10*3/uL (ref 4.0–10.5)

## 2014-07-26 LAB — LIPID PANEL
CHOLESTEROL: 298 mg/dL — AB (ref 0–200)
HDL: 112.4 mg/dL (ref >39.00)
LDL Cholesterol: 150 mg/dL — ABNORMAL HIGH (ref 0–99)
NONHDL: 185.6
Total CHOL/HDL Ratio: 3
Triglycerides: 177 mg/dL — ABNORMAL HIGH (ref 0.0–149.0)
VLDL: 35.4 mg/dL (ref 0.0–40.0)

## 2014-07-29 ENCOUNTER — Encounter: Payer: Self-pay | Admitting: Internal Medicine

## 2014-07-29 LAB — TSH: TSH: 1.14 u[IU]/mL (ref 0.35–4.50)

## 2014-08-08 ENCOUNTER — Ambulatory Visit: Payer: 59 | Admitting: Physician Assistant

## 2014-08-16 ENCOUNTER — Encounter: Payer: Self-pay | Admitting: Gastroenterology

## 2014-08-20 ENCOUNTER — Ambulatory Visit (INDEPENDENT_AMBULATORY_CARE_PROVIDER_SITE_OTHER): Payer: 59 | Admitting: Physician Assistant

## 2014-08-20 ENCOUNTER — Ambulatory Visit: Payer: 59 | Admitting: Internal Medicine

## 2014-08-20 ENCOUNTER — Other Ambulatory Visit (INDEPENDENT_AMBULATORY_CARE_PROVIDER_SITE_OTHER): Payer: 59

## 2014-08-20 ENCOUNTER — Encounter: Payer: Self-pay | Admitting: Physician Assistant

## 2014-08-20 ENCOUNTER — Telehealth: Payer: Self-pay | Admitting: *Deleted

## 2014-08-20 VITALS — BP 138/88 | HR 84 | Ht 68.0 in | Wt 121.1 lb

## 2014-08-20 DIAGNOSIS — Z1211 Encounter for screening for malignant neoplasm of colon: Secondary | ICD-10-CM

## 2014-08-20 DIAGNOSIS — R1314 Dysphagia, pharyngoesophageal phase: Secondary | ICD-10-CM

## 2014-08-20 DIAGNOSIS — R748 Abnormal levels of other serum enzymes: Secondary | ICD-10-CM | POA: Diagnosis not present

## 2014-08-20 LAB — PROTIME-INR
INR: 0.9 ratio (ref 0.8–1.0)
Prothrombin Time: 10.4 s (ref 9.6–13.1)

## 2014-08-20 LAB — HEPATITIS C ANTIBODY: HCV Ab: NEGATIVE

## 2014-08-20 MED ORDER — POLYETHYLENE GLYCOL 3350 17 GM/SCOOP PO POWD: 1.0000 | Freq: Every day | ORAL | Status: DC

## 2014-08-20 NOTE — Patient Instructions (Addendum)
Please go to the basement level to have your labs drawn.   You have been scheduled for an abdominal ultrasound at [facility] (1st floor of hospital) on Friday 08-23-2014 at 3:00 Pm . Please arrive  At 2:45 PM  prior to your appointment for registration. Make certain not to have anything to eat or drink 6 hours prior to your appointment. Should you need to reschedule your appointment, please contact radiology at [phone]. This test typically takes about 30 minutes to perform. You have been scheduled for an endoscopy and colonoscopy. Please follow the written instructions given to you at your visit today. Please pick up your prep supplies at the pharmacy within the next 1-3 days. [facility]. If you use inhalers (even only as needed), please bring them with you on the day of your procedure. Your physician has requested that you go to www.startemmi.com and enter the access code given to you at your visit today. This web site gives a general overview about your procedure. However, you should still follow specific instructions given to you by our office regarding your preparation for the procedure.

## 2014-08-20 NOTE — Telephone Encounter (Signed)
LM for [place] on cell phone ( patient requested I do that) Her [place] appt is at [facility], Friday 08-23-2014. She is to arrive at 2:45 PM for a 3:00 Pm . She is to have nothing by mouth after 9:00 am .

## 2014-08-20 NOTE — Progress Notes (Signed)
Patient ID: Lisa Long, female   DOB: 03/12/1966, 49 y.o.   MRN: [id number]   Subjective:    Patient ID: Lisa Long, female    DOB: 1965/07/06, 49 y.o.   MRN: [id number]  HPI [name] is a pleasant 49 year old white female, new to GI today referred by Dr. Ronnald Ramp for evaluation of dysphagia. Patient relates that she has history with Dr. Collene Mares and had a colonoscopy probably close to 20 years ago, and also had a hemorrhoidectomy by Dr. Marlou Starks about 10 years ago. She has history of hypertension, Raynauds' and tobacco use. Past history also lists alcoholic hepatitis. Patient says she is upset by this diagnosis and says she's not an alcoholic though she does drink at least 2 glasses of wine every day has had mildly elevated transaminases over the past couple of years. She has not had any further workup for this. Her current problem with dysphagia has occurred over the past couple of months. She says she has a heavy sensation with swallowing like her food is not going down. She gets some sensation of food hanging. No pain and no regurgitation. Primarily having difficulty with solids and no difficulty with liquids. She does have occasional heartburn but not on a regular basis. She has no complaints of abdominal pain, appetite is been fine and weight has been stable. She does have problems with constipation and abdominal bloating. Family history is negative for colon cancer.  Review of Systems Pertinent positive and negative review of systems were noted in the above HPI section.  All other review of systems was otherwise negative.  Outpatient Encounter Prescriptions as of 08/20/2014  Medication Sig  . cloNIDine (CATAPRES - DOSED IN MG/24 HR) 0.1 mg/24hr patch Place 0.1 mg onto the skin once a week.  . Cyanocobalamin 500 MCG/0.1ML SOLN Place 0.1 mLs (500 mcg total) into the nose once a week.  . diltiazem (CARDIZEM CD) 120 MG 24 hr capsule Take 120 mg by mouth daily.  [name] diltiazem (DILACOR XR) 120 MG 24 hr  capsule Take 1 capsule (120 mg total) by mouth daily.  . folic acid (FOLVITE) 1 MG tablet Take 1 tablet (1 mg total) by mouth daily.  . polyethylene glycol powder (GLYCOLAX/MIRALAX) powder Take 255 g by mouth daily.   No facility-administered encounter medications on file as of 08/20/2014.   No Known Allergies Patient Active Problem List   Diagnosis Date Noted  . Visit for screening mammogram 07/23/2014  . Routine general medical examination at a health care facility 07/23/2014  . Dysphagia, pharyngoesophageal phase 07/23/2014  . Raynaud's disease, idiopathic 01/10/2014  . Cervical spondylosis without myelopathy 03/07/2013  . Alcoholic hepatitis 31/54/0086  . 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 IBS (irritable bowel syndrome) 02/24/2011  . TOBACCO USE 12/31/2009  . GERD 12/31/2009  . Hyperglycemia 03/14/2008  . Vitamin B12 deficiency neuropathy 02/21/2008  . Essential hypertension, benign 02/12/2008   History   Social History  . Marital Status: Married    Spouse Name: N/A  . Number of Children: 0  . Years of Education: COLLEGE   Occupational History  .     Social History Main Topics  . Smoking status: Current Every Day Smoker -- 0.50 packs/day for 25 years    Types: Cigarettes  . Smokeless tobacco: Never Used     Comment: Currently attending [employer] [facility]oking Cessation program  . Alcohol Use: 12.0 oz/week    20 Glasses of wine per week     Comment: 1-2 glasses wine daily  .  Drug Use: No  . Sexual Activity: Yes    Birth Control/ Protection: Surgical   Other Topics Concern  . Not on file   Social History Narrative   Regular exercise-Yes   Married    Ms. Robicheaux family history includes ALS in her mother; Diabetes in her other; Hypertension in her other; Stroke in her other.      Objective:    Filed Vitals:   08/20/14 0835  BP: 138/88  Pulse: 84    Physical Exam  well-developed thin white female in no acute distress, pleasant blood pressure 138/88  pulse 84 height 5 foot 8 weight 121. HEENT; nontraumatic normocephalic EOMI PERRLA sclera anicteric, Supple; no JVD, Cardiovascular; regular rate and rhythm with S1-S2 no murmur or gallop, Pulmonary ;clear bilaterally somewhat decreased breath sounds, Abdomen; thin soft nontender nondistended bowel sounds are present no palpable mass or hepatosplenomegaly, Rectal exam not done, Extremities; no clubbing cyanosis or edema skin warm and dry, Psych; mood and affect appropriate       Assessment & Plan:  #1 49 yo female with 2 month hx of solid food dysphagia- r/o stricture vs occult lesion #2 Colon neoplasia surveillance- pt had colonoscopy 20 years ago, due for screening #3 Constipation #4 raynauds #5 elevated Liver enzymes- possibly ETOH related #6 Smoker #7 HTN  Plan; Will schedule for EGD with possible dilation  , and colonoscopy with Dr Carlean Purl. Procedures discussed in detail with pt and she is agreeable to proceed. Will check upper Abdominal US Check hep C, serology, PT and CDT level. Advised pt that general recommendation for women is no more than one drink per day She will increase water intake and add a fiber supplement for constipation to start.    Amy Genia Harold PA-C 08/20/2014   Cc: Janith Lima, MD

## 2014-08-21 LAB — CARBOHYDRATE DEFICIENT TRANSFERRIN
%CDT: 5 % — ABNORMAL HIGH (ref <2.5)
CDT: 126 mg/L — ABNORMAL HIGH (ref 28.1–76.0)
Transferrin: 251 mg/dL (ref 188–341)

## 2014-08-23 ENCOUNTER — Ambulatory Visit (HOSPITAL_COMMUNITY)
Admission: RE | Admit: 2014-08-23 | Discharge: 2014-08-23 | Disposition: A | Discharge status: Home / Self Care | Payer: 59 | Source: Ambulatory Visit | Attending: Physician Assistant | Admitting: Physician Assistant

## 2014-08-23 DIAGNOSIS — R748 Abnormal levels of other serum enzymes: Secondary | ICD-10-CM | POA: Insufficient documentation

## 2014-08-23 DIAGNOSIS — I10 Essential (primary) hypertension: Secondary | ICD-10-CM | POA: Insufficient documentation

## 2014-08-23 DIAGNOSIS — K219 Gastro-esophageal reflux disease without esophagitis: Secondary | ICD-10-CM | POA: Diagnosis not present

## 2014-08-23 DIAGNOSIS — K7010 Alcoholic hepatitis without ascites: Secondary | ICD-10-CM | POA: Insufficient documentation

## 2014-08-26 ENCOUNTER — Encounter: Payer: Self-pay | Admitting: Internal Medicine

## 2014-09-06 NOTE — Progress Notes (Signed)
Agree with Lisa Long's assessment and plan. Lisa Leveque E. Lusero Nordlund, MD, FACG   

## 2014-10-04 ENCOUNTER — Ambulatory Visit (AMBULATORY_SURGERY_CENTER): Payer: 59 | Admitting: Internal Medicine

## 2014-10-04 ENCOUNTER — Encounter: Payer: Self-pay | Admitting: Internal Medicine

## 2014-10-04 VITALS — BP 153/108 | HR 94 | Temp 99.5°F | Resp 22 | Ht 68.0 in | Wt 121.0 lb

## 2014-10-04 DIAGNOSIS — R1319 Other dysphagia: Secondary | ICD-10-CM

## 2014-10-04 DIAGNOSIS — K2210 Ulcer of esophagus without bleeding: Secondary | ICD-10-CM

## 2014-10-04 DIAGNOSIS — D122 Benign neoplasm of ascending colon: Secondary | ICD-10-CM | POA: Diagnosis not present

## 2014-10-04 DIAGNOSIS — R1310 Dysphagia, unspecified: Secondary | ICD-10-CM

## 2014-10-04 DIAGNOSIS — K208 Other esophagitis: Secondary | ICD-10-CM

## 2014-10-04 DIAGNOSIS — R1314 Dysphagia, pharyngoesophageal phase: Secondary | ICD-10-CM | POA: Diagnosis not present

## 2014-10-04 DIAGNOSIS — Z1211 Encounter for screening for malignant neoplasm of colon: Secondary | ICD-10-CM | POA: Diagnosis not present

## 2014-10-04 DIAGNOSIS — K222 Esophageal obstruction: Secondary | ICD-10-CM

## 2014-10-04 MED ORDER — SODIUM CHLORIDE 0.9 % IV SOLN: 500.0000 mL | INTRAVENOUS | Status: DC

## 2014-10-04 MED ORDER — PANTOPRAZOLE SODIUM 40 MG PO TBEC
40.0000 mg | DELAYED_RELEASE_TABLET | Freq: Every day | ORAL | Status: DC
Start: 2014-10-04 — End: 2016-01-22

## 2014-10-04 NOTE — Op Note (Signed)
[place]  [address]. [place], [id number]   ENDOSCOPY PROCEDURE REPORT  PATIENT: Lisa Long, Lisa Long  MR#: [id number] BIRTHDATE: 1965-12-02 , 49  yrs. old GENDER: female ENDOSCOPIST: Gatha Mayer, MD, [facility] PROCEDURE DATE:  10/04/2014 PROCEDURE:  EGD w/ biopsy and EGD w/ balloon dilation ASA CLASS:     Class II INDICATIONS:  dysphagia. MEDICATIONS: Propofol 300 mg IV and Monitored anesthesia care TOPICAL ANESTHETIC: none  DESCRIPTION OF PROCEDURE: After the risks benefits and alternatives of the procedure were thoroughly explained, informed consent was obtained.  The LB [id number] endoscope was introduced through the mouth and advanced to the second portion of the duodenum , Without limitations.  The instrument was slowly withdrawn as the mucosa was fully examined.    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   3) 35-30 cm (5cm) hiatus hernia 4) Otherwise normal EGD.  Retroflexed views revealed as previously described. The scope was then withdrawn from the patient and the procedure completed.  COMPLICATIONS: There were no immediate complications.  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  Clear liquids until , then soft foods rest iof day.  Resume prior diet tomorrow. 2.  PPI qam pantoprazole 40 mg qAM 3.  Anti-reflux regimen to be followed 4. Will call/mail bx results 5. colonoscopy next   eSigned:  Gatha Mayer, MD, [facility] 10/04/2014 3:17 PM    AT:[id number] Ronnald Ramp, MD and The Patient

## 2014-10-04 NOTE — Progress Notes (Signed)
Called to room to assist during endoscopic procedure.  Patient ID and intended procedure confirmed with present staff. Received instructions for my participation in the procedure from the performing physician.   Wray Kearns, RN assisted Dr. Carlean Purl with balloon dilatation of the esophagus.  maw

## 2014-10-04 NOTE — Patient Instructions (Addendum)
I found a colon polyp and removed it - small and looks benign. You also have a condition called diverticulosis - common and not usually a problem. Please read the handout provided.  I stretched the esophagus and found esophagitis and a hiatal hernia.  YOU HAD AN ENDOSCOPIC PROCEDURE TODAY AT [facility]:   Refer to the procedure report that was given to you for any specific questions about what was found during the examination.  If the procedure report does not answer your questions, please call your gastroenterologist to clarify.  If you requested that your care partner not be given the details of your procedure findings, then the procedure report has been included in a sealed envelope for you to review at your convenience later.  YOU SHOULD EXPECT: Some feelings of bloating in the abdomen. Passage of more gas than usual.  Walking can help get rid of the air that was put into your GI tract during the procedure and reduce the bloating. If you had a lower endoscopy (such as a colonoscopy or flexible sigmoidoscopy) you may notice spotting of blood in your stool or on the toilet paper. If you underwent a bowel prep for your procedure, you may not have a normal bowel movement for a few days.  Please Note:  You might notice some irritation and congestion in your nose or some drainage.  This is from the oxygen used during your procedure.  There is no need for concern and it should clear up in a day or so.  SYMPTOMS TO REPORT IMMEDIATELY:   Following lower endoscopy (colonoscopy or flexible sigmoidoscopy):  Excessive amounts of blood in the stool  Significant tenderness or worsening of abdominal pains  Swelling of the abdomen that is new, acute  Fever of 100F or higher   Following upper endoscopy (EGD)  Vomiting of blood or coffee ground material  New chest pain or pain under the shoulder blades  Painful or persistently difficult swallowing  New shortness of  breath  Fever of 100F or higher  Black, tarry-looking stools  For urgent or emergent issues, a gastroenterologist can be reached at any hour by calling [phone].   DIET: Your first meal following the procedure should be a small meal and then it is ok to progress to your normal diet. Heavy or fried foods are harder to digest and may make you feel nauseous or bloated.  Likewise, meals heavy in dairy and vegetables can increase bloating.  Drink plenty of fluids but you should avoid alcoholic beverages for 24 hours.  ACTIVITY:  You should plan to take it easy for the rest of today and you should NOT DRIVE or use heavy machinery until tomorrow (because of the sedation medicines used during the test).    FOLLOW UP: Our staff will call the number listed on your records the next business day following your procedure to check on you and address any questions or concerns that you may have regarding the information given to you following your procedure. If we do not reach you, we will leave a message.  However, if you are feeling well and you are not experiencing any problems, there is no need to return our call.  We will assume that you have returned to your regular daily activities without incident.  If any biopsies were taken you will be contacted by phone or by letter within the next 1-3 weeks.  Please call us at [phone] if you have not heard about  the biopsies in 3 weeks.    SIGNATURES/CONFIDENTIALITY: You and/or your care partner have signed paperwork which will be entered into your electronic medical record.  These signatures attest to the fact that that the information above on your After Visit Summary has been reviewed and is understood.  Full responsibility of the confidentiality of this discharge information lies with you and/or your care-partner.YOU HAD AN ENDOSCOPIC PROCEDURE TODAY AT [facility]:   Refer to the procedure report that was given to you for any  specific questions about what was found during the examination.  If the procedure report does not answer your questions, please call your gastroenterologist to clarify.  If you requested that your care partner not be given the details of your procedure findings, then the procedure report has been included in a sealed envelope for you to review at your convenience later.  YOU SHOULD EXPECT: Some feelings of bloating in the abdomen. Passage of more gas than usual.  Walking can help get rid of the air that was put into your GI tract during the procedure and reduce the bloating. If you had a lower endoscopy (such as a colonoscopy or flexible sigmoidoscopy) you may notice spotting of blood in your stool or on the toilet paper. If you underwent a bowel prep for your procedure, you may not have a normal bowel movement for a few days.  Please Note:  You might notice some irritation and congestion in your nose or some drainage.  This is from the oxygen used during your procedure.  There is no need for concern and it should clear up in a day or so.  SYMPTOMS TO REPORT IMMEDIATELY:   Following lower endoscopy (colonoscopy or flexible sigmoidoscopy):  Excessive amounts of blood in the stool  Significant tenderness or worsening of abdominal pains  Swelling of the abdomen that is new, acute  Fever of 100F or higher   Following upper endoscopy (EGD)  Vomiting of blood or coffee ground material  New chest pain or pain under the shoulder blades  Painful or persistently difficult swallowing  New shortness of breath  Fever of 100F or higher  Black, tarry-looking stools  For urgent or emergent issues, a gastroenterologist can be reached at any hour by calling [phone].   DIET: Your first meal following the procedure should be a small meal and then it is ok to progress to your normal diet. Heavy or fried foods are harder to digest and may make you feel nauseous or bloated.  Likewise, meals heavy in  dairy and vegetables can increase bloating.  Drink plenty of fluids but you should avoid alcoholic beverages for 24 hours.  ACTIVITY:  You should plan to take it easy for the rest of today and you should NOT DRIVE or use heavy machinery until tomorrow (because of the sedation medicines used during the test).    FOLLOW UP: Our staff will call the number listed on your records the next business day following your procedure to check on you and address any questions or concerns that you may have regarding the information given to you following your procedure. If we do not reach you, we will leave a message.  However, if you are feeling well and you are not experiencing any problems, there is no need to return our call.  We will assume that you have returned to your regular daily activities without incident.  If any biopsies were taken you will be contacted by phone or by  letter within the next 1-3 weeks.  Please call us at [phone] if you have not heard about the biopsies in 3 weeks.    SIGNATURES/CONFIDENTIALITY: You and/or your care partner have signed paperwork which will be entered into your electronic medical record.  These signatures attest to the fact that that the information above on your After Visit Summary has been reviewed and is understood.  Full responsibility of the confidentiality of this discharge information lies with you and/or your care-partner.

## 2014-10-04 NOTE — Progress Notes (Signed)
To recovery, report to Smith, RN, VSS 

## 2014-10-04 NOTE — Op Note (Signed)
[facility]  [address] [place], [id number]   COLONOSCOPY PROCEDURE REPORT  PATIENT: Lisa Long, Lisa Long  MR#: [id number] BIRTHDATE: January 05, 1966 , 49  yrs. old GENDER: female ENDOSCOPIST: Gatha Mayer, MD, [facility] PROCEDURE DATE:  10/04/2014 PROCEDURE:   Colonoscopy, screening and Colonoscopy with biopsy First Screening Colonoscopy - Avg.  risk and is 50 yrs.  old or older Yes.  Prior Negative Screening - Now for repeat screening. N/A  History of Adenoma - Now for follow-up colonoscopy & has been > or = to 3 yrs.  N/A  Polyps removed today? Yes ASA CLASS:   Class II INDICATIONS:Screening for colonic neoplasia and Colorectal Neoplasm Risk Assessment for this procedure is average risk. MEDICATIONS: Residual sedation present, Propofol 200 mg IV, and Monitored anesthesia care  DESCRIPTION OF PROCEDURE:   After the risks benefits and alternatives of the procedure were thoroughly explained, informed consent was obtained.  The digital rectal exam revealed no abnormalities of the rectum.   The LB [id number]  endoscope was introduced through the anus and advanced to the cecum, which was identified by both the appendix and ileocecal valve. No adverse events experienced.   The quality of the prep was good.  (MiraLax was used)  The instrument was then slowly withdrawn as the colon was fully examined. Estimated blood loss is zero unless otherwise noted in this procedure report.      COLON FINDINGS: A sessile polyp measuring 3 mm in size was found in the ascending colon.  A polypectomy was performed with cold forceps.  The resection was complete, the polyp tissue was completely retrieved and sent to histology.   There was diverticulosis noted in the sigmoid colon.  Normal otherwise. Retroflexed views revealed no abnormalities. The time to cecum = 3.9 Withdrawal time = 8.6   The scope was withdrawn and the procedure completed. COMPLICATIONS: There were no immediate  complications.  ENDOSCOPIC IMPRESSION: 1.   Sessile polyp was found in the ascending colon; polypectomy was performed with cold forceps 2.   Diverticulosis was noted in the sigmoid colon 3.   Normal colonoscopy otherwise  RECOMMENDATIONS: 1.  Timing of repeat colonoscopy will be determined by pathology findings. 2.  Patient to call next week and aschedule September follow-up appointment  eSigned:  Gatha Mayer, MD, [facility] 10/04/2014 3:22 PM   cc: Scarlette Calico, MD and The Patient

## 2014-10-08 ENCOUNTER — Telehealth: Payer: Self-pay | Admitting: *Deleted

## 2014-10-08 NOTE — Telephone Encounter (Signed)
Left message that we called for f/u 

## 2014-10-14 ENCOUNTER — Encounter: Payer: Self-pay | Admitting: Internal Medicine

## 2014-10-14 DIAGNOSIS — Z860101 Personal history of adenomatous and serrated colon polyps: Secondary | ICD-10-CM | POA: Insufficient documentation

## 2014-10-14 DIAGNOSIS — Z86010 Personal history of colonic polyps: Secondary | ICD-10-CM

## 2014-10-14 HISTORY — DX: Personal history of colonic polyps: Z86.010

## 2014-10-14 HISTORY — DX: Personal history of adenomatous and serrated colon polyps: Z86.0101

## 2014-10-14 NOTE — Progress Notes (Signed)
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

## 2014-10-15 LAB — HM COLONOSCOPY

## 2014-10-29 ENCOUNTER — Telehealth: Payer: Self-pay | Admitting: Neurology

## 2014-10-29 DIAGNOSIS — M79601 Pain in right arm: Secondary | ICD-10-CM

## 2014-10-29 NOTE — Telephone Encounter (Signed)
Patient contacted [place] today indicating that she has had recurrence of the growth on the tip of the right index finger. She is now having pain up the entire right arm, similar to her complex regional pain syndrome that she had previously. In the past, she has responded some to gabapentin, she has seen dermatology, she has been placed back on gabapentin. The patient has continued to have discomfort. Dr. Ace Gins in the past did nerve blocks on the right shoulder that seemed to help. Dr. Ace Gins unfortunately is no longer practicing. I will set up a referral to Dr. Nicholaus Bloom to see if he can evaluate her in the near future.

## 2014-12-16 ENCOUNTER — Telehealth: Payer: Self-pay | Admitting: Neurology

## 2014-12-16 DIAGNOSIS — M79641 Pain in right hand: Secondary | ICD-10-CM

## 2014-12-16 NOTE — Telephone Encounter (Signed)
The patient is having increasing pain and discomfort in the right hand, we will make a referral to Dr. Estanislado Pandy

## 2014-12-26 ENCOUNTER — Other Ambulatory Visit: Payer: Self-pay | Admitting: Vascular Surgery

## 2014-12-30 ENCOUNTER — Other Ambulatory Visit: Payer: Self-pay | Admitting: *Deleted

## 2014-12-30 DIAGNOSIS — L98499 Non-pressure chronic ulcer of skin of other sites with unspecified severity: Secondary | ICD-10-CM

## 2015-01-01 ENCOUNTER — Other Ambulatory Visit (HOSPITAL_COMMUNITY): Payer: Self-pay | Admitting: Orthopedic Surgery

## 2015-01-01 ENCOUNTER — Telehealth (HOSPITAL_COMMUNITY): Payer: Self-pay | Admitting: *Deleted

## 2015-01-01 DIAGNOSIS — M349 Systemic sclerosis, unspecified: Secondary | ICD-10-CM

## 2015-01-01 NOTE — Telephone Encounter (Signed)
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

## 2015-01-02 ENCOUNTER — Other Ambulatory Visit (HOSPITAL_COMMUNITY): Payer: 59

## 2015-01-02 ENCOUNTER — Encounter: Payer: 59 | Admitting: Vascular Surgery

## 2015-01-07 ENCOUNTER — Other Ambulatory Visit: Payer: Self-pay | Admitting: Radiology

## 2015-01-08 ENCOUNTER — Encounter (HOSPITAL_COMMUNITY): Payer: Self-pay

## 2015-01-08 ENCOUNTER — Ambulatory Visit (HOSPITAL_COMMUNITY)
Admission: RE | Admit: 2015-01-08 | Discharge: 2015-01-08 | Disposition: A | Discharge status: Home / Self Care | Payer: 59 | Source: Ambulatory Visit | Attending: Orthopedic Surgery | Admitting: Orthopedic Surgery

## 2015-01-08 ENCOUNTER — Other Ambulatory Visit (HOSPITAL_COMMUNITY): Payer: Self-pay | Admitting: Orthopedic Surgery

## 2015-01-08 DIAGNOSIS — F17210 Nicotine dependence, cigarettes, uncomplicated: Secondary | ICD-10-CM | POA: Diagnosis not present

## 2015-01-08 DIAGNOSIS — K7010 Alcoholic hepatitis without ascites: Secondary | ICD-10-CM | POA: Insufficient documentation

## 2015-01-08 DIAGNOSIS — K222 Esophageal obstruction: Secondary | ICD-10-CM | POA: Insufficient documentation

## 2015-01-08 DIAGNOSIS — D529 Folate deficiency anemia, unspecified: Secondary | ICD-10-CM | POA: Insufficient documentation

## 2015-01-08 DIAGNOSIS — M341 CR(E)ST syndrome: Secondary | ICD-10-CM | POA: Diagnosis not present

## 2015-01-08 DIAGNOSIS — Z86010 Personal history of colonic polyps: Secondary | ICD-10-CM | POA: Diagnosis not present

## 2015-01-08 DIAGNOSIS — K219 Gastro-esophageal reflux disease without esophagitis: Secondary | ICD-10-CM | POA: Diagnosis not present

## 2015-01-08 DIAGNOSIS — I7300 Raynaud's syndrome without gangrene: Secondary | ICD-10-CM | POA: Insufficient documentation

## 2015-01-08 DIAGNOSIS — E538 Deficiency of other specified B group vitamins: Secondary | ICD-10-CM | POA: Insufficient documentation

## 2015-01-08 DIAGNOSIS — I739 Peripheral vascular disease, unspecified: Secondary | ICD-10-CM | POA: Insufficient documentation

## 2015-01-08 DIAGNOSIS — I10 Essential (primary) hypertension: Secondary | ICD-10-CM | POA: Diagnosis not present

## 2015-01-08 DIAGNOSIS — K589 Irritable bowel syndrome without diarrhea: Secondary | ICD-10-CM | POA: Diagnosis not present

## 2015-01-08 DIAGNOSIS — M349 Systemic sclerosis, unspecified: Secondary | ICD-10-CM | POA: Diagnosis not present

## 2015-01-08 LAB — BASIC METABOLIC PANEL
Anion gap: 13 (ref 5–15)
BUN: 9 mg/dL (ref 6–20)
CHLORIDE: 104 mmol/L (ref 101–111)
CO2: 26 mmol/L (ref 22–32)
Calcium: 9.9 mg/dL (ref 8.9–10.3)
Creatinine, Ser: 0.43 mg/dL — ABNORMAL LOW (ref 0.44–1.00)
GFR calc Af Amer: >60 mL/min (ref >60)
GFR calc non Af Amer: >60 mL/min (ref >60)
GLUCOSE: 96 mg/dL (ref 65–99)
POTASSIUM: 3.5 mmol/L (ref 3.5–5.1)
Sodium: 143 mmol/L (ref 135–145)

## 2015-01-08 LAB — CBC WITH DIFFERENTIAL/PLATELET
Basophils Absolute: 0 10*3/uL (ref 0.0–0.1)
Basophils Relative: 1 %
Eosinophils Absolute: 0.1 10*3/uL (ref 0.0–0.7)
Eosinophils Relative: 2 %
HCT: 38.1 % (ref 36.0–46.0)
Hemoglobin: 12.9 g/dL (ref 12.0–15.0)
Lymphocytes Relative: 16 %
Lymphs Abs: 1.1 10*3/uL (ref 0.7–4.0)
MCH: 34.6 pg — ABNORMAL HIGH (ref 26.0–34.0)
MCHC: 33.9 g/dL (ref 30.0–36.0)
MCV: 102.1 fL — ABNORMAL HIGH (ref 78.0–100.0)
Monocytes Absolute: 0.6 10*3/uL (ref 0.1–1.0)
Monocytes Relative: 9 %
Neutro Abs: 5.3 10*3/uL (ref 1.7–7.7)
Neutrophils Relative %: 72 %
Platelets: 198 10*3/uL (ref 150–400)
RBC: 3.73 MIL/uL — ABNORMAL LOW (ref 3.87–5.11)
RDW: 15.3 % (ref 11.5–15.5)
WBC: 7.2 10*3/uL (ref 4.0–10.5)

## 2015-01-08 LAB — PROTIME-INR
INR: 0.93 (ref 0.00–1.49)
Prothrombin Time: 12.6 seconds (ref 11.6–15.2)

## 2015-01-08 MED ORDER — FENTANYL CITRATE (PF) 100 MCG/2ML IJ SOLN
INTRAMUSCULAR | Status: AC
  Filled 2015-01-08: qty 4

## 2015-01-08 MED ORDER — IODIXANOL 320 MG/ML IV SOLN
25.0000 mL | Freq: Once | INTRAVENOUS | Status: DC | PRN
  Administered 2015-01-08: 25 mL via INTRA_ARTERIAL
  Filled 2015-01-08: qty 50

## 2015-01-08 MED ORDER — IOHEXOL 300 MG/ML  SOLN: 70.0000 mL | Freq: Once | INTRAMUSCULAR | Status: DC | PRN

## 2015-01-08 MED ORDER — IODIXANOL 320 MG/ML IV SOLN
70.0000 mL | Freq: Once | INTRAVENOUS | Status: DC | PRN
  Administered 2015-01-08: 70 mL via INTRA_ARTERIAL
  Filled 2015-01-08: qty 100

## 2015-01-08 MED ORDER — MIDAZOLAM HCL 2 MG/2ML IJ SOLN
INTRAMUSCULAR | Status: AC | PRN
  Administered 2015-01-08 (×2): 0.5 mg via INTRAVENOUS
  Administered 2015-01-08: 1 mg via INTRAVENOUS

## 2015-01-08 MED ORDER — IOHEXOL 300 MG/ML  SOLN: 25.0000 mL | Freq: Once | INTRAMUSCULAR | Status: DC | PRN

## 2015-01-08 MED ORDER — SODIUM CHLORIDE 0.9 % IV SOLN
INTRAVENOUS | Status: DC
  Administered 2015-01-08: 10:00:00 via INTRAVENOUS

## 2015-01-08 MED ORDER — FENTANYL CITRATE (PF) 100 MCG/2ML IJ SOLN
INTRAMUSCULAR | Status: AC | PRN
  Administered 2015-01-08: 25 ug via INTRAVENOUS
  Administered 2015-01-08: 50 ug via INTRAVENOUS

## 2015-01-08 MED ORDER — TRAMADOL HCL 50 MG PO TABS
50.0000 mg | ORAL_TABLET | Freq: Once | ORAL | Status: AC
  Administered 2015-01-08: 50 mg via ORAL
  Filled 2015-01-08: qty 1

## 2015-01-08 MED ORDER — TRAMADOL HCL 50 MG PO TABS: 50.0000 mg | ORAL_TABLET | Freq: Once | ORAL | Status: DC

## 2015-01-08 MED ORDER — LIDOCAINE HCL 1 % IJ SOLN
INTRAMUSCULAR | Status: AC
  Filled 2015-01-08: qty 20

## 2015-01-08 MED ORDER — MIDAZOLAM HCL 2 MG/2ML IJ SOLN
INTRAMUSCULAR | Status: AC
  Filled 2015-01-08: qty 6

## 2015-01-08 NOTE — Procedures (Signed)
Successful RUE ANGIO NO COMP STABLE FULL REPORT IN PACS

## 2015-01-08 NOTE — Progress Notes (Signed)
Notified Dr Reesa Chew of VS and plan to work tomorrow. No further instruction. Updated Pt accordingly. Report given to L. Benfield and J. Burt Knack, RNs.

## 2015-01-08 NOTE — Sedation Documentation (Signed)
23fr sheath removed from right fem artery by Dr. Annamaria Boots. Hemostasis achieved using manual pressure held by Jill Side, RTR for 15 minutes. RDP +4, RPT +4, groin level 0.

## 2015-01-08 NOTE — Discharge Instructions (Signed)
Moderate Conscious Sedation, Adult Sedation is the use of medicines to promote relaxation and relieve discomfort and anxiety. Moderate conscious sedation is a type of sedation. Under moderate conscious sedation you are less alert than normal but are still able to respond to instructions or stimulation. Moderate conscious sedation is used during short medical and dental procedures. It is milder than deep sedation or general anesthesia and allows you to return to your regular activities sooner. LET [facility] CARE PROVIDER KNOW ABOUT:   Any allergies you have.  All medicines you are taking, including vitamins, herbs, eye drops, creams, and over-the-counter medicines.  Use of steroids (by mouth or creams).  Previous problems you or members of your family have had with the use of anesthetics.  Any blood disorders you have.  Previous surgeries you have had.  Medical conditions you have.  Possibility of pregnancy, if this applies.  Use of cigarettes, alcohol, or illegal drugs. RISKS AND COMPLICATIONS Generally, this is a safe procedure. However, as with any procedure, problems can occur. Possible problems include:  Oversedation.  Trouble breathing on your own. You may need to have a breathing tube until you are awake and breathing on your own.  Allergic reaction to any of the medicines used for the procedure. BEFORE THE PROCEDURE  You may have blood tests done. These tests can help show how well your kidneys and liver are working. They can also show how well your blood clots.  A physical exam will be done.  Only take medicines as directed by your health care provider. You may need to stop taking medicines (such as blood thinners, aspirin, or nonsteroidal anti-inflammatory drugs) before the procedure.   Do not eat or drink at least 6 hours before the procedure or as directed by your health care provider.  Arrange for a responsible adult, family member, or friend to take you home  after the procedure. He or she should stay with you for at least 24 hours after the procedure, until the medicine has worn off. PROCEDURE   An intravenous (IV) catheter will be inserted into one of your veins. Medicine will be able to flow directly into your body through this catheter. You may be given medicine through this tube to help prevent pain and help you relax.  The medical or dental procedure will be done. AFTER THE PROCEDURE  You will stay in a recovery area until the medicine has worn off. Your blood pressure and pulse will be checked.   Depending on the procedure you had, you may be allowed to go home when you can tolerate liquids and your pain is under control.   This information is not intended to replace advice given to you by your health care provider. Make sure you discuss any questions you have with your health care provider.   Document Released: 12/15/2000 Document Revised: 04/12/2014 Document Reviewed: 11/27/2012 Elsevier Interactive Patient Education 2016 [place] An angiogram, also called angiography, is a procedure used to look at the blood vessels. In this procedure, dye is injected through a long, thin tube (catheter) into an artery. X-rays are then taken. The X-rays will show if there is a blockage or problem in a blood vessel.  LET [facility] CARE PROVIDER KNOW ABOUT:  Any allergies you have, including allergies to shellfish or contrast dye.   All medicines you are taking, including vitamins, herbs, eye drops, creams, and over-the-counter medicines.   Previous problems you or members of your family have had with the  use of anesthetics.   Any blood disorders you have.   Previous surgeries you have had.  Any previous kidney problems or failure you have had.  Medical conditions you have.   Possibility of pregnancy, if this applies. RISKS AND COMPLICATIONS Generally, an angiogram is a safe procedure. However, as with any procedure,  problems can occur. Possible problems include:  Injury to the blood vessels, including rupture or bleeding.  Infection or bruising at the catheter site.  Allergic reaction to the dye or contrast used.  Kidney damage from the dye or contrast used.  Blood clots that can lead to a stroke or heart attack. BEFORE THE PROCEDURE  Do not eat or drink after midnight on the night before the procedure, or as directed by your health care provider.   Ask your health care provider if you may drink enough water to take any needed medicines the morning of the procedure.  PROCEDURE  You may be given a medicine to help you relax (sedative) before and during the procedure. This medicine is given through an IV access tube that is inserted into one of your veins.   The area where the catheter will be inserted will be washed and shaved. This is usually done in the groin but may be done in the fold of your arm (near your elbow) or in the wrist.  A medicine will be given to numb the area where the catheter will be inserted (local anesthetic).  The catheter will be inserted with a guide wire into an artery. The catheter is guided by using a type of X-ray (fluoroscopy) to the blood vessel being examined.   Dye is then injected into the catheter, and X-rays are taken. The dye helps to show where any narrowing or blockages are located.  AFTER THE PROCEDURE   If the procedure is done through the leg, you will be kept in bed lying flat for several hours. You will be instructed to not bend or cross your legs.  The insertion site will be checked frequently.  The pulse in your feet or wrist will be checked frequently.  Additional blood tests, X-rays, and electrocardiography may be done.   You may need to stay in the hospital overnight for observation.    This information is not intended to replace advice given to you by your health care provider. Make sure you discuss any questions you have with your  health care provider.   Document Released: 12/30/2004 Document Revised: 04/12/2014 Document Reviewed: 08/23/2012 Elsevier Interactive Patient Education 2016 [place] After Refer to this sheet in the next few weeks. These instructions provide you with information about caring for yourself after your procedure. Your health care provider may also give you more specific instructions. Your treatment has been planned according to current medical practices, but problems sometimes occur. Call your health care provider if you have any problems or questions after your procedure. WHAT TO EXPECT AFTER THE PROCEDURE After your procedure, it is typical to have the following:  Bruising at the catheter insertion site that usually fades within 1-2 weeks.  Blood collecting in the tissue (hematoma) that may be painful to the touch. It should usually decrease in size and tenderness within 1-2 weeks. HOME CARE INSTRUCTIONS  Take medicines only as directed by your health care provider.  You may shower 24-48 hours after the procedure or as directed by your health care provider. Remove the bandage (dressing) and gently wash the site with plain soap and water.  Pat the area dry with a clean towel. Do not rub the site, because this may cause bleeding.  Do not take baths, swim, or use a hot tub until your health care provider approves.  Check your insertion site every day for redness, swelling, or drainage.  Do not apply powder or lotion to the site.  Do not lift over 10 lb (4.5 kg) for 5 days after your procedure or as directed by your health care provider.  Ask your health care provider when it is okay to:  Return to work or school.  Resume usual physical activities or sports.  Resume sexual activity.  Do not drive home if you are discharged the same day as the procedure. Have someone else drive you.  You may drive 24 hours after the procedure unless otherwise instructed by your  health care provider.  Do not operate machinery or power tools for 24 hours after the procedure or as directed by your health care provider.  If your procedure was done as an outpatient procedure, which means that you went home the same day as your procedure, a responsible adult should be with you for the first 24 hours after you arrive home.  Keep all follow-up visits as directed by your health care provider. This is important. SEEK MEDICAL CARE IF:  You have a fever.  You have chills.  You have increased bleeding from the catheter insertion site. Hold pressure on the site. SEEK IMMEDIATE MEDICAL CARE IF:  You have unusual pain at the catheter insertion site.  You have redness, warmth, or swelling at the catheter insertion site.  You have drainage (other than a small amount of blood on the dressing) from the catheter insertion site.  The catheter insertion site is bleeding, and the bleeding does not stop after 30 minutes of holding steady pressure on the site.  The area near or just beyond the catheter insertion site becomes pale, cool, tingly, or numb.   This information is not intended to replace advice given to you by your health care provider. Make sure you discuss any questions you have with your health care provider.   Document Released: 10/08/2004 Document Revised: 04/12/2014 Document Reviewed: 08/23/2012 Elsevier Interactive Patient Education 2016 [place] may remove dressing and shower 24 to 48 hours after procedure. Keep area clean and dry.  Moderate Conscious Sedation, Adult, Care After Refer to this sheet in the next few weeks. These instructions provide you with information on caring for yourself after your procedure. Your health care provider may also give you more specific instructions. Your treatment has been planned according to current medical practices, but problems sometimes occur. Call your health care provider if you have any problems or questions  after your procedure. WHAT TO EXPECT AFTER THE PROCEDURE  After your procedure:  You may feel sleepy, clumsy, and have poor balance for several hours.  Vomiting may occur if you eat too soon after the procedure. HOME CARE INSTRUCTIONS  Do not participate in any activities where you could become injured for at least 24 hours. Do not:  Drive.  Swim.  Ride a bicycle.  Operate heavy machinery.  Cook.  Use power tools.  Climb ladders.  Work from a high place.  Do not make important decisions or sign legal documents until you are improved.  If you vomit, drink water, juice, or soup when you can drink without vomiting. Make sure you have little or no nausea before eating solid foods.  Only  take over-the-counter or prescription medicines for pain, discomfort, or fever as directed by your health care provider.  Make sure you and your family fully understand everything about the medicines given to you, including what side effects may occur.  You should not drink alcohol, take sleeping pills, or take medicines that cause drowsiness for at least 24 hours.  If you smoke, do not smoke without supervision.  If you are feeling better, you may resume normal activities 24 hours after you were sedated.  Keep all appointments with your health care provider. SEEK MEDICAL CARE IF:  Your skin is pale or bluish in color.  You continue to feel nauseous or vomit.  Your pain is getting worse and is not helped by medicine.  You have bleeding or swelling.  You are still sleepy or feeling clumsy after 24 hours. SEEK IMMEDIATE MEDICAL CARE IF:  You develop a rash.  You have difficulty breathing.  You develop any type of allergic problem.  You have a fever. MAKE SURE YOU:  Understand these instructions.  Will watch your condition.  Will get help right away if you are not doing well or get worse.   This information is not intended to replace advice given to you by your health  care provider. Make sure you discuss any questions you have with your health care provider.   Document Released: 01/10/2013 Document Revised: 04/12/2014 Document Reviewed: 01/10/2013 Elsevier Interactive Patient Education [employer].

## 2015-01-08 NOTE — H&P (Signed)
Chief Complaint: Patient was seen in consultation today for right upper extremity arteriogram   Referring Physician(s): Kuzma,Gary  History of Present Illness: Lisa Long is a 49 y.o. female smoker with PMH significant for HTN, Raynauds' disease/ CREST syndrome, IBS, GERD, vit B12 deficiency and elevated LFT's/alcoholic hepatitis. Over the past 3 months she has had pain/numbness, diminished grip strength in right hand concerning for ischemia. She presents today for right upper extremity arteriogram for further evaluation.   Past Medical History  Diagnosis Date  . Hypertension   . Vitamin B12 deficiency   . Seasonal allergies   . Tobacco use   . GERD (gastroesophageal reflux disease)   . IBS (irritable bowel syndrome)   . Folate deficiency anemia   . Alcoholic hepatitis   . Cervical spondylosis without myelopathy   . Raynaud's disease, idiopathic   . Esophageal stricture - GERD 07/23/2014  . Hx of adenomatous polyp of colon 10/14/2014    Past Surgical History  Procedure Laterality Date  . Cervical fusion    . Hemorrhoid surgery    . Oophorectomy    . Breast surgery      benign breast cysts  . Dermoid cyst  excision    . 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 Abdominal hysterectomy    . Colonoscopy    . Nailbed repair Right 01/30/2013    Procedure: BIOPSY OF RIGHT LONG NAIL FOLD;  Surgeon: [name]., MD;  Location: [place];  Service: Orthopedics;  Laterality: Right;  [place]rose used as tourniquet--on at 0944, off at 1001.    Allergies: Review of patient's allergies indicates no known allergies.  Medications: Prior to Admission medications   Medication Sig Start Date End Date Taking? Authorizing Provider  Cyanocobalamin 500 MCG/0.1ML SOLN Place 0.1 mLs (500 mcg total) into the nose once a week. 01/10/14   Janith Lima, MD  diltiazem (CARDIZEM CD) 120 MG 24 hr capsule Take 120 mg by mouth daily.    Historical Provider, MD    diltiazem (DILACOR XR) 120 MG 24 hr capsule Take 1 capsule (120 mg total) by mouth daily. 07/23/14   Janith Lima, MD  pantoprazole (PROTONIX) 40 MG tablet Take 1 tablet (40 mg total) by mouth daily before breakfast. 10/04/14   Gatha Mayer, MD     Family History  Problem Relation Age of Onset  . ALS Mother   . Diabetes Other   . Hypertension Other   . Stroke Other     F 1st degree relative <60    Social History   Social History  . Marital Status: Married    Spouse Name: N/A  . Number of Children: 0  . Years of Education: COLLEGE   Occupational History  .     Social History Main Topics  . Smoking status: Current Every Day Smoker -- 0.50 packs/day for 25 years    Types: Cigarettes  . Smokeless tobacco: Never Used     Comment: Currently attending [place] [facility] Cessation program  . Alcohol Use: 12.0 oz/week    20 Glasses of wine per week     Comment: 1-2 glasses wine daily  . Drug Use: No  . Sexual Activity: Yes    Birth Control/ Protection: Surgical   Other Topics Concern  . Not on file   Social History Narrative   Regular exercise-Yes   Married      Review of Systems  Constitutional: Negative for fever and chills.  Respiratory: Negative for cough and shortness of breath.   Cardiovascular: Negative for chest pain.  Gastrointestinal: Negative for nausea, vomiting, abdominal pain and blood in stool.  Genitourinary: Negative for dysuria and hematuria.  Musculoskeletal: Negative for back pain.       Rt hand pain/numbness, primarily in first 3 fingers; occ rt elbow pain  Neurological: Negative for headaches.  Psychiatric/Behavioral: The patient is nervous/anxious.     Vital Signs: BP 137/85 mmHg  Pulse 99  Temp(Src) 98.7 F (37.1 C) (Oral)  Resp 18  Ht 5\' 8"  (1.727 m)  Wt 122 lb (55.339 kg)  BMI 18.55 kg/m2  SpO2 95%  LMP   Physical Exam  Constitutional: She is oriented to person, place, and time. She appears well-developed and well-nourished.   Cardiovascular: Normal rate, regular rhythm and intact distal pulses.   Pulmonary/Chest: Effort normal and breath sounds normal.  Abdominal: Soft. Bowel sounds are normal.  Musculoskeletal: She exhibits no edema.  Slightly increased rubor to first three fingers rt hand, tender to palpation, few small ulcerative areas noted  Neurological: She is alert and oriented to person, place, and time.    Mallampati Score:     Imaging: No results found.  Labs:  CBC:  Recent Labs  01/10/14 1234 07/26/14 1442  WBC 8.0 9.0  HGB 13.8 13.6  HCT 40.7 39.9  PLT 191.0 175.0    COAGS:  Recent Labs  08/20/14 0954  INR 0.9    BMP:  Recent Labs  01/10/14 1234 07/26/14 1442  NA 141 134*  K 3.8 3.8  CL 101 94*  CO2 28 31  GLUCOSE 88 87  BUN 7 17  CALCIUM 9.4 10.3  CREATININE 0.6 0.93    LIVER FUNCTION TESTS:  Recent Labs  01/10/14 1234 07/26/14 1442  BILITOT 0.5 0.6  AST 107* 53*  ALT 56* 38*  ALKPHOS 52 61  PROT 8.4* 8.2  ALBUMIN 4.3 4.9    TUMOR MARKERS: No results for input(s): AFPTM, CEA, CA199, CHROMGRNA in the last 8760 hours.  Assessment and Plan: Lisa Long is a 49 y.o. female smoker with PMH significant for HTN, Raynauds' disease/ CREST syndrome , IBS, GERD, vit B12 deficiency and elevated LFT's/alcoholic hepatitis. Over the past 3 months she has had pain/numbness, diminished grip strength in right hand concerning for ischemia. She presents today for right upper extremity arteriogram for further evaluation.Details/risks of procedure, including but not limited to, internal bleeding, infection, contrast nephropathy, embolization d/w pt/husband with their understanding and consent.    Thank you for this interesting consult.  I greatly enjoyed meeting Lisa Long and look forward to participating in their care.  A copy of this report was sent to the requesting provider on this date.  Signed: D. Rowe Robert 01/08/2015, 9:47 AM   I spent a total of  30 minutes in face to face in clinical consultation, greater than 50% of which was counseling/coordinating care for right upper extremity arteriogram

## 2015-01-10 ENCOUNTER — Ambulatory Visit (HOSPITAL_COMMUNITY)
Admission: RE | Admit: 2015-01-10 | Discharge: 2015-01-10 | Disposition: A | Discharge status: Home / Self Care | Payer: 59 | Source: Ambulatory Visit | Attending: Internal Medicine | Admitting: Internal Medicine

## 2015-01-10 DIAGNOSIS — M349 Systemic sclerosis, unspecified: Secondary | ICD-10-CM | POA: Diagnosis not present

## 2015-01-10 DIAGNOSIS — F17210 Nicotine dependence, cigarettes, uncomplicated: Secondary | ICD-10-CM | POA: Insufficient documentation

## 2015-01-10 LAB — PULMONARY FUNCTION TEST
DL/VA % pred: 70 %
DL/VA: 3.68 ml/min/mmHg/L
DLCO unc % pred: 70 %
DLCO unc: 20.95 ml/min/mmHg
FEF 25-75 Post: 2.18 L/sec
FEF 25-75 Pre: 2.66 L/sec
FEF2575-%Change-Post: -18 %
FEF2575-%PRED-PRE: 89 %
FEF2575-%Pred-Post: 73 %
FEV1-%Change-Post: -4 %
FEV1-%PRED-POST: 97 %
FEV1-%PRED-PRE: 102 %
FEV1-POST: 3.12 L
FEV1-PRE: 3.27 L
FEV1FVC-%Change-Post: 2 %
FEV1FVC-%Pred-Pre: 95 %
FEV6-%Change-Post: -6 %
FEV6-%PRED-PRE: 108 %
FEV6-%Pred-Post: 101 %
FEV6-POST: 3.98 L
FEV6-Pre: 4.26 L
FEV6FVC-%Change-Post: 0 %
FEV6FVC-%PRED-POST: 102 %
FEV6FVC-%PRED-PRE: 102 %
FVC-%Change-Post: -6 %
FVC-%PRED-PRE: 106 %
FVC-%Pred-Post: 98 %
FVC-POST: 3.99 L
FVC-PRE: 4.28 L
POST FEV6/FVC RATIO: 100 %
PRE FEV1/FVC RATIO: 76 %
PRE FEV6/FVC RATIO: 99 %
Post FEV1/FVC ratio: 78 %
RV % PRED: 83 %
RV: 1.67 L
TLC % PRED: 119 %
TLC: 6.77 L

## 2015-01-10 MED ORDER — ALBUTEROL SULFATE (2.5 MG/3ML) 0.083% IN NEBU
2.5000 mg | INHALATION_SOLUTION | Freq: Once | RESPIRATORY_TRACT | Status: AC
  Administered 2015-01-10: 2.5 mg via RESPIRATORY_TRACT

## 2015-01-14 ENCOUNTER — Telehealth: Payer: Self-pay | Admitting: Neurology

## 2015-01-14 ENCOUNTER — Other Ambulatory Visit: Payer: Self-pay | Admitting: Orthopedic Surgery

## 2015-01-14 DIAGNOSIS — M79601 Pain in right arm: Secondary | ICD-10-CM

## 2015-01-14 NOTE — Telephone Encounter (Signed)
I talked with the patient today. She is having increased discomfort in the right arm and hand. She wishes to have a second opinion through Dr. Amedeo Plenty, I will try to set this up.

## 2015-01-24 ENCOUNTER — Ambulatory Visit (HOSPITAL_BASED_OUTPATIENT_CLINIC_OR_DEPARTMENT_OTHER)
Admission: RE | Admit: 2015-01-24 | Discharge: 2015-01-24 | Disposition: A | Discharge status: Home / Self Care | Payer: 59 | Source: Ambulatory Visit | Attending: Internal Medicine | Admitting: Internal Medicine

## 2015-01-24 ENCOUNTER — Ambulatory Visit (HOSPITAL_COMMUNITY)
Admission: RE | Admit: 2015-01-24 | Discharge: 2015-01-24 | Disposition: A | Discharge status: Home / Self Care | Payer: 59 | Source: Ambulatory Visit | Attending: Internal Medicine | Admitting: Internal Medicine

## 2015-01-24 VITALS — BP 154/98 | HR 89 | Ht 68.0 in | Wt 117.0 lb

## 2015-01-24 DIAGNOSIS — I10 Essential (primary) hypertension: Secondary | ICD-10-CM

## 2015-01-24 DIAGNOSIS — M349 Systemic sclerosis, unspecified: Secondary | ICD-10-CM | POA: Diagnosis not present

## 2015-01-24 DIAGNOSIS — R942 Abnormal results of pulmonary function studies: Secondary | ICD-10-CM | POA: Diagnosis not present

## 2015-01-24 DIAGNOSIS — F17200 Nicotine dependence, unspecified, uncomplicated: Secondary | ICD-10-CM | POA: Insufficient documentation

## 2015-01-24 NOTE — Progress Notes (Signed)
ADVANCED [facility] CLINIC CONSULT NOTE  Referring Physician: Dr. Kirke Corin   HPI:  Lisa Long is a 49 y/o woman with h/o tobacco use (quit 12/2014), HTN, spinal stenosis and recently diagnosed CREST syndrome who is referred by Dr. Patrecia Pour for enrollment into the [facility] screening program.  Works as Interior and spatial designer for [employer]. About a month ago developed digital ulcerations and worsening Raynaud's disease. Diagnosed with CREST. Started on sildenafil and amlodipine. Underwent nerve block and is pending sympathectomy.  Also stopped smoking. Overall feels fine. Stays active with 2 dogs. No edema. No presyncope or cough. Recently had GI w/u and has suspected GI dysmotility/GERD.   Mom with scleroderma and PAH  Echo today EF 60% RV normal. No PAH   PFTs with normal spirometry. Slightly reduced DLCO at 70%   Review of Systems: [y] = yes, [ ]  = no   General: Weight gain [ ] ; Weight loss [ ] ; Anorexia [ ] ; Fatigue [ ] ; Fever [ ] ; Chills [ ] ; Weakness [ ]   Cardiac: Chest pain/pressure [ ] ; Resting SOB [ ] ; Exertional SOB [ ] ; Orthopnea [ ] ; Pedal Edema [ ] ; Palpitations [ ] ; Syncope [ ] ; Presyncope [ ] ; Paroxysmal nocturnal dyspnea[ ]   Pulmonary: Cough [ ] ; Wheezing[ ] ; Hemoptysis[ ] ; Sputum [ ] ; Snoring [ ]   GI: Vomiting[ ] ; Dysphagia[ ] ; Melena[ ] ; Hematochezia [ ] ; Heartburn[ ] ; Abdominal pain [ ] ; Constipation [ ] ; Diarrhea [ ] ; BRBPR [ ]   GU: Hematuria[ ] ; Dysuria [ ] ; Nocturia[ ]   Vascular: Pain in legs with walking [ ] ; Pain in feet with lying flat [ ] ; Non-healing sores Blue.Reese ]; Stroke [ ] ; TIA [ ] ; Slurred speech [ ] ;  Neuro: Headaches[ ] ; Vertigo[ ] ; Seizures[ ] ; Paresthesias[ ] ;Blurred vision [ ] ; Diplopia [ ] ; Vision changes [ ]   Ortho/Skin: Arthritis [ ] ; Joint pain [ ] ; Muscle pain [ ] ; Joint swelling [ ] ; Back Pain [ ] ; Rash [ ]   Psych: Depression[ ] ; Anxiety[ ]   Heme: Bleeding problems [ ] ; Clotting disorders [ ] ; Anemia [ ]   Endocrine: Diabetes [ ] ; Thyroid  dysfunction[ ]    Past Medical History  Diagnosis Date  . Hypertension   . Vitamin B12 deficiency   . Seasonal allergies   . Tobacco use   . GERD (gastroesophageal reflux disease)   . IBS (irritable bowel syndrome)   . Folate deficiency anemia   . Cervical spondylosis without myelopathy   . Raynaud's disease, idiopathic   . Esophageal stricture - GERD 07/23/2014  . Hx of adenomatous polyp of colon 10/14/2014  . Alcoholic hepatitis     Pt states ruled out by Santina Evans    Current Outpatient Prescriptions  Medication Sig Dispense Refill  . amLODipine (NORVASC) 5 MG tablet Take 5 mg by mouth daily.    . Cyanocobalamin 500 MCG/0.1ML SOLN Place 0.1 mLs (500 mcg total) into the nose once a week. 2.3 mL 11  . pantoprazole (PROTONIX) 40 MG tablet Take 1 tablet (40 mg total) by mouth daily before breakfast. 30 tablet 3  . sildenafil (REVATIO) 20 MG tablet Take 20 mg by mouth daily.    . traMADol (ULTRAM) 50 MG tablet Take 50 mg by mouth 2 (two) times daily as needed for moderate pain.    [name] acetaminophen (TYLENOL) 500 MG tablet Take 1,000 mg by mouth daily as needed for mild pain, moderate pain, fever or headache.     No current facility-administered medications for this encounter.    No Known  Allergies    Social History   Social History  . Marital Status: Married    Spouse Name: N/A  . Number of Children: 0  . Years of Education: COLLEGE   Occupational History  .     Social History Main Topics  . Smoking status: Current Every Day Smoker -- 0.50 packs/day for 25 years    Types: Cigarettes  . Smokeless tobacco: Never Used     Comment: Currently attending [place] [facility]oking Cessation program  . Alcohol Use: 12.0 oz/week    20 Glasses of wine per week     Comment: 1-2 glasses wine daily  . Drug Use: No  . Sexual Activity: Yes    Birth Control/ Protection: Surgical   Other Topics Concern  . Not on file   Social History Narrative   Regular exercise-Yes   Married       Family History  Problem Relation Age of Onset  . ALS Mother   . Diabetes Other   . Hypertension Other   . Stroke Other     F 1st degree relative <60    Filed Vitals:   01/24/15 1409  BP: 154/98  Pulse: 89  Height: 5\' 8"  (1.727 m)  Weight: 117 lb (53.071 kg)  SpO2: 100%    PHYSICAL EXAM: General:  Thin. Well appearing. No respiratory difficulty HEENT: normal Neck: supple. no JVD. Carotids 2+ bilat; no bruits. No lymphadenopathy or thryomegaly appreciated. Cor: PMI nondisplaced. Regular rate & rhythm. No rubs, gallops or murmurs. Lungs: clear with mildly decreased breath sounds Abdomen: soft, nontender, nondistended. No hepatosplenomegaly. No bruits or masses. Good bowel sounds. Extremities: no cyanosis, clubbing, rash, edema. Ulceration tip of R 3rd finger. R 2nd finger  Neuro: alert & oriented x 3, cranial nerves grossly intact. moves all 4 extremities w/o difficulty. Affect pleasant.   ASSESSMENT & PLAN: 1. Scleroderma 2. HTN 3. Abnormal PFTs with mildly decreased diffusion capacity  We discussed the incidence of CREST-associated PAH and scleroderma related lung disease as well as the need for annual screening.. I have reviewed her echo personally and there is no evidence of PAH. Her PFTs reveal a mild diffusion defect out of proportion to her spirometry. This may be related to her tobacco use. Will proceed with hi-res chest CT to further evaluate for pulmonary fibrosis and establish a baseline. Will repeat echo and PFTs in 1 year or sooner should she develop symptoms. Can increase amlodipine to 10mg  daily as needed for HTN.   Tadeo Besecker,MD 10:46 PM

## 2015-01-24 NOTE — Patient Instructions (Signed)
Non-Cardiac CT scanning, (CAT scanning), is a noninvasive, special x-ray that produces cross-sectional images of the body using x-rays and a computer. CT scans help physicians diagnose and treat medical conditions. For some CT exams, a contrast material is used to enhance visibility in the area of the body being studied. CT scans provide greater clarity and reveal more details than regular x-ray exams.  Your physician has recommended that you have a pulmonary function test. Pulmonary Function Tests are a group of tests that measure how well air moves in and out of your lungs. 01/2016  Your physician has requested that you have an echocardiogram. Echocardiography is a painless test that uses sound waves to create images of your heart. It provides your doctor with information about the size and shape of your heart and how well your heart's chambers and valves are working. This procedure takes approximately one hour. There are no restrictions for this procedure. Due 01/2016  Your physician recommends that you schedule a follow-up appointment in: 12 months with PFT and ECHO

## 2015-01-24 NOTE — Progress Notes (Signed)
  Echocardiogram 2D Echocardiogram has been performed.  Lisa Long 01/24/2015, 1:35 PM

## 2015-01-27 ENCOUNTER — Other Ambulatory Visit (HOSPITAL_COMMUNITY): Payer: Self-pay

## 2015-01-29 ENCOUNTER — Encounter (HOSPITAL_BASED_OUTPATIENT_CLINIC_OR_DEPARTMENT_OTHER): Payer: Self-pay | Admitting: *Deleted

## 2015-01-30 ENCOUNTER — Encounter (HOSPITAL_BASED_OUTPATIENT_CLINIC_OR_DEPARTMENT_OTHER)
Admission: RE | Disposition: A | Discharge status: Home / Self Care | Payer: Self-pay | Source: Ambulatory Visit | Attending: Orthopedic Surgery

## 2015-01-30 ENCOUNTER — Ambulatory Visit (HOSPITAL_BASED_OUTPATIENT_CLINIC_OR_DEPARTMENT_OTHER): Payer: 59 | Admitting: Certified Registered"

## 2015-01-30 ENCOUNTER — Ambulatory Visit (HOSPITAL_BASED_OUTPATIENT_CLINIC_OR_DEPARTMENT_OTHER)
Admission: RE | Admit: 2015-01-30 | Discharge: 2015-01-30 | Disposition: A | Discharge status: Home / Self Care | Payer: 59 | Source: Ambulatory Visit | Attending: Orthopedic Surgery | Admitting: Orthopedic Surgery

## 2015-01-30 ENCOUNTER — Encounter (HOSPITAL_BASED_OUTPATIENT_CLINIC_OR_DEPARTMENT_OTHER): Payer: Self-pay | Admitting: Orthopedic Surgery

## 2015-01-30 DIAGNOSIS — L98499 Non-pressure chronic ulcer of skin of other sites with unspecified severity: Secondary | ICD-10-CM | POA: Insufficient documentation

## 2015-01-30 DIAGNOSIS — F17210 Nicotine dependence, cigarettes, uncomplicated: Secondary | ICD-10-CM | POA: Insufficient documentation

## 2015-01-30 DIAGNOSIS — I739 Peripheral vascular disease, unspecified: Secondary | ICD-10-CM | POA: Diagnosis present

## 2015-01-30 DIAGNOSIS — M349 Systemic sclerosis, unspecified: Secondary | ICD-10-CM | POA: Diagnosis not present

## 2015-01-30 DIAGNOSIS — I7025 Atherosclerosis of native arteries of other extremities with ulceration: Secondary | ICD-10-CM | POA: Diagnosis not present

## 2015-01-30 DIAGNOSIS — I82621 Acute embolism and thrombosis of deep veins of right upper extremity: Secondary | ICD-10-CM | POA: Insufficient documentation

## 2015-01-30 DIAGNOSIS — I10 Essential (primary) hypertension: Secondary | ICD-10-CM | POA: Insufficient documentation

## 2015-01-30 HISTORY — PX: SYMPATHECTOMY: SHX792

## 2015-01-30 SURGERY — SYMPATHECTOMY
Anesthesia: Regional | Site: Hand | Laterality: Right

## 2015-01-30 MED ORDER — CHLORHEXIDINE GLUCONATE 4 % EX LIQD: 60.0000 mL | Freq: Once | CUTANEOUS | Status: DC

## 2015-01-30 MED ORDER — OXYCODONE-ACETAMINOPHEN 10-325 MG PO TABS: 1.0000 | ORAL_TABLET | ORAL | Status: DC | PRN

## 2015-01-30 MED ORDER — FENTANYL CITRATE (PF) 100 MCG/2ML IJ SOLN
50.0000 ug | INTRAMUSCULAR | Status: DC | PRN
  Administered 2015-01-30: 100 ug via INTRAVENOUS

## 2015-01-30 MED ORDER — HEPARIN SODIUM (PORCINE) 1000 UNIT/ML IJ SOLN
INTRAMUSCULAR | Status: AC
  Filled 2015-01-30: qty 1

## 2015-01-30 MED ORDER — SCOPOLAMINE 1 MG/3DAYS TD PT72
1.0000 | MEDICATED_PATCH | Freq: Once | TRANSDERMAL | Status: DC | PRN
Start: 2015-01-30 — End: 2015-01-30

## 2015-01-30 MED ORDER — LIDOCAINE HCL (CARDIAC) 20 MG/ML IV SOLN
INTRAVENOUS | Status: AC
  Filled 2015-01-30: qty 5

## 2015-01-30 MED ORDER — CEFAZOLIN SODIUM-DEXTROSE 2-3 GM-% IV SOLR: 2.0000 g | INTRAVENOUS | Status: DC

## 2015-01-30 MED ORDER — LIDOCAINE HCL (PF) 1 % IJ SOLN
INTRAMUSCULAR | Status: AC
  Filled 2015-01-30: qty 30

## 2015-01-30 MED ORDER — DEXAMETHASONE SODIUM PHOSPHATE 10 MG/ML IJ SOLN
INTRAMUSCULAR | Status: AC
  Filled 2015-01-30: qty 1

## 2015-01-30 MED ORDER — ONDANSETRON HCL 4 MG/2ML IJ SOLN
INTRAMUSCULAR | Status: DC | PRN
  Administered 2015-01-30: 4 mg via INTRAVENOUS

## 2015-01-30 MED ORDER — LACTATED RINGERS IV SOLN
INTRAVENOUS | Status: DC | PRN
  Administered 2015-01-30: 13:00:00

## 2015-01-30 MED ORDER — PHENYLEPHRINE HCL 10 MG/ML IJ SOLN
10.0000 mg | INTRAVENOUS | Status: DC | PRN
  Administered 2015-01-30: 50 ug/min via INTRAVENOUS

## 2015-01-30 MED ORDER — PHENYLEPHRINE HCL 10 MG/ML IJ SOLN
INTRAMUSCULAR | Status: AC
  Filled 2015-01-30: qty 1

## 2015-01-30 MED ORDER — LIDOCAINE HCL (CARDIAC) 20 MG/ML IV SOLN
INTRAVENOUS | Status: DC | PRN
  Administered 2015-01-30: 30 mg via INTRAVENOUS

## 2015-01-30 MED ORDER — FENTANYL CITRATE (PF) 100 MCG/2ML IJ SOLN
INTRAMUSCULAR | Status: AC
Start: 2015-01-30 — End: 2015-01-30
  Filled 2015-01-30: qty 2

## 2015-01-30 MED ORDER — DEXAMETHASONE SODIUM PHOSPHATE 10 MG/ML IJ SOLN
INTRAMUSCULAR | Status: DC | PRN
  Administered 2015-01-30: 10 mg via INTRAVENOUS

## 2015-01-30 MED ORDER — BUPIVACAINE-EPINEPHRINE (PF) 0.5% -1:200000 IJ SOLN
INTRAMUSCULAR | Status: DC | PRN
Start: 2015-01-30 — End: 2015-01-30
  Administered 2015-01-30: 25 mL via PERINEURAL

## 2015-01-30 MED ORDER — PROPOFOL 10 MG/ML IV BOLUS
INTRAVENOUS | Status: DC | PRN
  Administered 2015-01-30: 200 mg via INTRAVENOUS

## 2015-01-30 MED ORDER — ONDANSETRON HCL 4 MG/2ML IJ SOLN
INTRAMUSCULAR | Status: AC
  Filled 2015-01-30: qty 2

## 2015-01-30 MED ORDER — MIDAZOLAM HCL 2 MG/2ML IJ SOLN
1.0000 mg | INTRAMUSCULAR | Status: DC | PRN
  Administered 2015-01-30: 2 mg via INTRAVENOUS

## 2015-01-30 MED ORDER — MIDAZOLAM HCL 2 MG/2ML IJ SOLN
INTRAMUSCULAR | Status: AC
  Filled 2015-01-30: qty 2

## 2015-01-30 MED ORDER — GLYCOPYRROLATE 0.2 MG/ML IJ SOLN: 0.2000 mg | Freq: Once | INTRAMUSCULAR | Status: DC | PRN

## 2015-01-30 MED ORDER — PROPOFOL 10 MG/ML IV BOLUS
INTRAVENOUS | Status: AC
  Filled 2015-01-30: qty 20

## 2015-01-30 MED ORDER — LACTATED RINGERS IV SOLN
INTRAVENOUS | Status: DC
  Administered 2015-01-30 (×2): via INTRAVENOUS

## 2015-01-30 MED ORDER — CEFAZOLIN SODIUM-DEXTROSE 2-3 GM-% IV SOLR
2.0000 g | INTRAVENOUS | Status: AC
  Administered 2015-01-30: 2 g via INTRAVENOUS

## 2015-01-30 MED ORDER — PHENYLEPHRINE HCL 10 MG/ML IJ SOLN
INTRAMUSCULAR | Status: DC | PRN
Start: 2015-01-30 — End: 2015-01-30
  Administered 2015-01-30 (×6): 40 ug via INTRAVENOUS

## 2015-01-30 MED ORDER — CEFAZOLIN SODIUM-DEXTROSE 2-3 GM-% IV SOLR
INTRAVENOUS | Status: AC
  Filled 2015-01-30: qty 50

## 2015-01-30 SURGICAL SUPPLY — 49 items
BAG DECANTER FOR FLEXI CONT (MISCELLANEOUS) ×3 IMPLANT
BLADE MINI RND TIP GREEN BEAV (BLADE) ×3 IMPLANT
BLADE SURG 15 STRL LF DISP TIS (BLADE) ×2 IMPLANT
BLADE SURG 15 STRL SS (BLADE) ×1
BNDG COHESIVE 3X5 TAN STRL LF (GAUZE/BANDAGES/DRESSINGS) ×3 IMPLANT
BNDG ESMARK 4X9 LF (GAUZE/BANDAGES/DRESSINGS) ×3 IMPLANT
BNDG GAUZE ELAST 4 BULKY (GAUZE/BANDAGES/DRESSINGS) ×3 IMPLANT
CHLORAPREP W/TINT 26ML (MISCELLANEOUS) ×3 IMPLANT
CORDS BIPOLAR (ELECTRODE) ×3 IMPLANT
COVER BACK TABLE 60X90IN (DRAPES) ×3 IMPLANT
COVER MAYO STAND STRL (DRAPES) ×3 IMPLANT
CUFF TOURNIQUET SINGLE 18IN (TOURNIQUET CUFF) ×3 IMPLANT
DRAPE EXTREMITY T 121X128X90 (DRAPE) ×3 IMPLANT
DRAPE SURG 17X23 STRL (DRAPES) ×3 IMPLANT
DRSG PAD ABDOMINAL 8X10 ST (GAUZE/BANDAGES/DRESSINGS) ×3 IMPLANT
GAUZE SPONGE 4X4 12PLY STRL (GAUZE/BANDAGES/DRESSINGS) ×3 IMPLANT
GAUZE XEROFORM 1X8 LF (GAUZE/BANDAGES/DRESSINGS) ×6 IMPLANT
GLOVE BIO SURGEON STRL SZ7.5 (GLOVE) ×3 IMPLANT
GLOVE BIOGEL PI IND STRL 7.0 (GLOVE) ×4 IMPLANT
GLOVE BIOGEL PI IND STRL 8 (GLOVE) ×2 IMPLANT
GLOVE BIOGEL PI IND STRL 8.5 (GLOVE) ×2 IMPLANT
GLOVE BIOGEL PI INDICATOR 7.0 (GLOVE) ×2
GLOVE BIOGEL PI INDICATOR 8 (GLOVE) ×1
GLOVE BIOGEL PI INDICATOR 8.5 (GLOVE) ×1
GLOVE ECLIPSE 6.5 STRL STRAW (GLOVE) ×6 IMPLANT
GLOVE SURG ORTHO 8.0 STRL STRW (GLOVE) ×3 IMPLANT
GOWN STRL REUS W/ TWL LRG LVL3 (GOWN DISPOSABLE) ×4 IMPLANT
GOWN STRL REUS W/TWL LRG LVL3 (GOWN DISPOSABLE) ×2
GOWN STRL REUS W/TWL XL LVL3 (GOWN DISPOSABLE) ×6 IMPLANT
NS IRRIG 1000ML POUR BTL (IV SOLUTION) ×3 IMPLANT
PACK BASIN DAY SURGERY FS (CUSTOM PROCEDURE TRAY) ×3 IMPLANT
PAD CAST 3X4 CTTN HI CHSV (CAST SUPPLIES) ×2 IMPLANT
PAD CAST 4YDX4 CTTN HI CHSV (CAST SUPPLIES) IMPLANT
PADDING CAST ABS 4INX4YD NS (CAST SUPPLIES) ×1
PADDING CAST ABS COTTON 4X4 ST (CAST SUPPLIES) ×2 IMPLANT
PADDING CAST COTTON 3X4 STRL (CAST SUPPLIES) ×1
PADDING CAST COTTON 4X4 STRL (CAST SUPPLIES)
SLEEVE SCD COMPRESS KNEE MED (MISCELLANEOUS) ×3 IMPLANT
SPEAR EYE SURG WECK-CEL (MISCELLANEOUS) ×3 IMPLANT
SPLINT PLASTER CAST XFAST 3X15 (CAST SUPPLIES) IMPLANT
SPLINT PLASTER XTRA FASTSET 3X (CAST SUPPLIES)
SPONGE SURGIFOAM ABS GEL 12-7 (HEMOSTASIS) ×3 IMPLANT
STOCKINETTE 4X48 STRL (DRAPES) ×3 IMPLANT
SUT ETHILON 4 0 PS 2 18 (SUTURE) ×6 IMPLANT
SUT NYLON 9 0 VRM6 (SUTURE) ×6 IMPLANT
SUT SILK 4 0 PS 2 (SUTURE) ×9 IMPLANT
SYR BULB 3OZ (MISCELLANEOUS) ×3 IMPLANT
TOWEL OR 17X24 6PK STRL BLUE (TOWEL DISPOSABLE) ×6 IMPLANT
UNDERPAD 30X30 (UNDERPADS AND DIAPERS) ×3 IMPLANT

## 2015-01-30 NOTE — Anesthesia Postprocedure Evaluation (Signed)
  Anesthesia Post-op Note  Patient: Lisa Long  Procedure(s) Performed: Procedure(s): DIGITAL SYMPATHECTOMY, RIGHT RADIAL ARTERY, ULNAR ARTERY, ARCH DIGITAL VESSELS INDEX MIDDLE RING SMALL GRAFT ULNAR ARTERY  (Right)  Patient Location: PACU  Anesthesia Type: General, Regional   Level of Consciousness: awake, alert  and oriented  Airway and Oxygen Therapy: Patient Spontanous Breathing  Post-op Pain: none  Post-op Assessment: Post-op Vital signs reviewed  Post-op Vital Signs: Reviewed  Last Vitals:  Filed Vitals:   01/30/15 1500  BP: 114/80  Pulse: 91  Temp:   Resp: 14    Complications: No apparent anesthesia complications

## 2015-01-30 NOTE — Discharge Instructions (Signed)
Regional Anesthesia Blocks ° °1. Numbness or the inability to move the "blocked" extremity may last from 3-48 hours after placement. The length of time depends on the medication injected and your individual response to the medication. If the numbness is not going away after 48 hours, call your surgeon. ° °2. The extremity that is blocked will need to be protected until the numbness is gone and the  Strength has returned. Because you cannot feel it, you will need to take extra care to avoid injury. Because it may be weak, you may have difficulty moving it or using it. You may not know what position it is in without looking at it while the block is in effect. ° °3. For blocks in the legs and feet, returning to weight bearing and walking needs to be done carefully. You will need to wait until the numbness is entirely gone and the strength has returned. You should be able to move your leg and foot normally before you try and bear weight or walk. You will need someone to be with you when you first try to ensure you do not fall and possibly risk injury. ° °4. Bruising and tenderness at the needle site are common side effects and will resolve in a few days. ° °5. Persistent numbness or new problems with movement should be communicated to the surgeon or the [facility] ([phone])/ [facility] ([phone]). ° ° °Post Anesthesia Home Care Instructions ° °Activity: °Get plenty of rest for the remainder of the day. A responsible adult should stay with you for 24 hours following the procedure.  °For the next 24 hours, DO NOT: °-Drive a car °-Operate machinery °-Drink alcoholic beverages °-Take any medication unless instructed by your physician °-Make any legal decisions or sign important papers. ° °Meals: °Start with liquid foods such as gelatin or soup. Progress to regular foods as tolerated. Avoid greasy, spicy, heavy foods. If nausea and/or vomiting occur, drink only clear liquids until the  nausea and/or vomiting subsides. Call your physician if vomiting continues. ° °Special Instructions/Symptoms: °Your throat may feel dry or sore from the anesthesia or the breathing tube placed in your throat during surgery. If this causes discomfort, gargle with warm salt water. The discomfort should disappear within 24 hours. ° °If you had a scopolamine patch placed behind your ear for the management of post- operative nausea and/or vomiting: ° °1. The medication in the patch is effective for 72 hours, after which it should be removed.  Wrap patch in a tissue and discard in the trash. Wash hands thoroughly with soap and water. °2. You may remove the patch earlier than 72 hours if you experience unpleasant side effects which may include dry mouth, dizziness or visual disturbances. °3. Avoid touching the patch. Wash your hands with soap and water after contact with the patch. °  °[facility] Center Instructions °Hand Surgery ° °Wound Care: °Keep your hand elevated above the level of your heart.  Do not allow it to dangle by your side.  Keep the dressing dry and do not remove it unless your doctor advises you to do so.  He will usually change it at the time of your post-op visit.  Moving your fingers is advised to stimulate circulation but will depend on the site of your surgery.  If you have a splint applied, your doctor will advise you regarding movement. ° °Activity: °Do not drive or operate machinery today.  Rest today and then you may return   to your normal activity and work as indicated by your physician. ° °Diet:  °Drink liquids today or eat a light diet.  You may resume a regular diet tomorrow.   ° °General expectations: °Pain for two to three days. °Fingers may become slightly swollen. ° °Call your doctor if any of the following occur: °Severe pain not relieved by pain medication. °Elevated temperature. °Dressing soaked with blood. °Inability to move fingers. °White or bluish color to fingers. °

## 2015-01-30 NOTE — Anesthesia Procedure Notes (Addendum)
Anesthesia Regional Block:  Infraclavicular brachial plexus block  Pre-Anesthetic Checklist: ,, timeout performed, Correct Patient, Correct Site, Correct Laterality, Correct Procedure, Correct Position, site marked, Risks and benefits discussed,  Surgical consent,  Pre-op evaluation,  At surgeon's request and post-op pain management  Laterality: Right and Upper  Prep: chloraprep       Needles:  Injection technique: Single-shot  Needle Type: Echogenic Stimulator Needle     Needle Length: 5cm 5 cm Needle Gauge: 21 and 21 G    Additional Needles:  Procedures: ultrasound guided (picture in chart) Infraclavicular brachial plexus block Narrative:  Start time: 01/30/2015 11:05 AM End time: 01/30/2015 11:12 AM Injection made incrementally with aspirations every 5 mL.  Performed by: Personally  Anesthesiologist: CREWS, DAVID   Procedure Name: LMA Insertion Date/Time: 01/30/2015 12:19 PM Performed by: Cimone Fahey D Pre-anesthesia Checklist: Patient identified, Emergency Drugs available, Suction available and Patient being monitored Patient Re-evaluated:Patient Re-evaluated prior to inductionOxygen Delivery Method: Circle System Utilized Preoxygenation: Pre-oxygenation with 100% oxygen Intubation Type: IV induction Ventilation: Mask ventilation without difficulty LMA: LMA inserted LMA Size: 4.0 Number of attempts: 1 Airway Equipment and Method: Bite block Placement Confirmation: positive ETCO2 Tube secured with: Tape Dental Injury: Teeth and Oropharynx as per pre-operative assessment       Right Infraclavicular image

## 2015-01-30 NOTE — Progress Notes (Addendum)
Assisted Dr. Crews with right, ultrasound guided, infraclavicular block. Side rails up, monitors on throughout procedure. See vital signs in flow sheet. Tolerated Procedure well. 

## 2015-01-30 NOTE — H&P (Signed)
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 The diagnosis was made 3 months ago and over the past 3-4 weeks she has had progressive pain changes and small ulceration at her index middle finger right hand. There is no history of diabetes, thyroid problems, arthritis or gout. She complains of constant severe sharp, throbbing and aching pain with a feeling of swelling. Activity makes it worse and rest makes it better. She is on Nitrobid ointment, Amlodipine, Tramadol, and Prednisone. She has minimal complaints on her left side. She has had the arteriogram done of her right arm. 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 There are distal occlusions on multiple digits. 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  PAST MEDICAL HISTORY:  She has no drug allergies.  She is on Nitrobid ointment, Amlodipine, Tramadol, and Prednisone. She has minimal complaints on her left side.  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 Daylene Long in 2014 for a verruca type lesion.  FAMILY MEDICAL HISTORY: Positive for heart disease, arthritis and high BP.  SOCIAL HISTORY:  She smokes 1-2 cigarettes a day and advised of the importance of quitting due to circulatory changes produced by the nicotine and tobacco by-products. She drinks socially. 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  REVIEW OF SYSTEMS: Positive for weight loss, glasses, high BP, skin ulcers and anemia. Lisa Long is an 49 y.o. female.   Chief Complaint: Pain right hand HPI: see above  Past Medical History  Diagnosis Date  . Hypertension   . Vitamin B12 deficiency   . Seasonal allergies   . Tobacco use   . GERD (gastroesophageal  reflux disease)   . IBS (irritable bowel syndrome)   . Folate deficiency anemia   . Cervical spondylosis without myelopathy   . Raynaud's disease, idiopathic   . Esophageal stricture - GERD 07/23/2014  . Hx of adenomatous polyp of colon 10/14/2014    Past Surgical History  Procedure Laterality Date  . Cervical fusion    . Hemorrhoid surgery    . Oophorectomy    . Breast surgery      benign breast cysts  . Dermoid cyst  excision    . 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 Abdominal hysterectomy    . Colonoscopy    . Nailbed repair Right 01/30/2013    Procedure: BIOPSY OF RIGHT LONG NAIL FOLD;  Surgeon: Lisa Long., MD;  Location: [facility];  Service: Orthopedics;  Laterality: Right;  Penrose used as tourniquet--on at 0944, off at 1001.    Family History  Problem Relation Age of Onset  . ALS Mother   . Diabetes Other   . Hypertension Other   . Stroke Other     F 1st degree relative <60   Social History:  reports that she has been smoking Cigarettes.  She has a 12.5 pack-year smoking history. She has never used smokeless tobacco. She reports that she drinks about 12.0 oz of alcohol per week. She reports that she does not use illicit drugs.  Allergies: No Known Allergies  Medications Prior to Admission  Medication Sig Dispense Refill  . amLODipine (NORVASC) 5 MG tablet  Take 5 mg by mouth daily.    . pantoprazole (PROTONIX) 40 MG tablet Take 1 tablet (40 mg total) by mouth daily before breakfast. 30 tablet 3  . sildenafil (REVATIO) 20 MG tablet Take 20 mg by mouth daily.    . traMADol (ULTRAM) 50 MG tablet Take 50 mg by mouth 2 (two) times daily as needed for moderate pain.    [name] acetaminophen (TYLENOL) 500 MG tablet Take 1,000 mg by mouth daily as needed for mild pain, moderate pain, fever or headache.    . Cyanocobalamin 500 MCG/0.1ML SOLN Place 0.1 mLs (500 mcg total) into the nose once a week. 2.3 mL 11    No results found for this or any  previous visit (from the past 48 hour(s)).  No results found.   Pertinent items are noted in HPI.  Height 5\' 8"  (1.727 m), weight 54.432 kg (120 lb).  General appearance: alert, cooperative and appears stated age Head: Normocephalic, without obvious abnormality Neck: no JVD Resp: clear to auscultation bilaterally Cardio: regular rate and rhythm, S1, S2 normal, no murmur, click, rub or gallop GI: soft, non-tender; bowel sounds normal; no masses,  no organomegaly Extremities: vacsular changes middle with small ulcer Pulses: 2+ and symmetric Skin: Skin color, texture, turgor normal. No rashes or lesions Neurologic: Grossly normal Incision/Wound: na  Assessment/Plan X-rays reveal no bony changes.  DIAGNOSIS: Scleroderma with vascular changes and early ulcerations index middle finger right hand.  PLAN: With her Scleroderma we would recommend digital sympathectomies including radial and ulnar arteries arch, common digital artery, possibility of vein graft to the ulnar artery. This will be scheduled as an outpatient. Pre, peri and post op care are discussed along with risks and complications. Patient is aware there is no guarantee with surgery, possibility of infection, injury to arteries, nerves, and tendons, incomplete relief and dystrophy. She is scheduled as an outpatient. We will see if we can get the sympathetic block done by Dr. Ace Long in that she has not heard from Dr. Isaiah Long.   Lisa Long R 01/30/2015, 10:07 AM

## 2015-01-30 NOTE — Anesthesia Preprocedure Evaluation (Signed)
Anesthesia Evaluation  Patient identified by MRN, date of birth, ID band Patient awake    Reviewed: Allergy & Precautions, NPO status , Patient's Chart, lab work & pertinent test results  Airway Mallampati: I  TM Distance: >3 FB Neck ROM: Full    Dental  (+) Teeth Intact, Dental Advisory Given   Pulmonary Current Smoker,    breath sounds clear to auscultation       Cardiovascular hypertension, Pt. on medications  Rhythm:Regular Rate:Normal     Neuro/Psych    GI/Hepatic GERD  Medicated and Controlled,(+) Hepatitis -  Endo/Other    Renal/GU      Musculoskeletal   Abdominal   Peds  Hematology   Anesthesia Other Findings   Reproductive/Obstetrics                             Anesthesia Physical Anesthesia Plan  ASA: III  Anesthesia Plan: General and Regional   Post-op Pain Management:    Induction: Intravenous  Airway Management Planned: LMA  Additional Equipment:   Intra-op Plan:   Post-operative Plan: Extubation in OR  Informed Consent: I have reviewed the patients History and Physical, chart, labs and discussed the procedure including the risks, benefits and alternatives for the proposed anesthesia with the patient or authorized representative who has indicated his/her understanding and acceptance.   Dental advisory given  Plan Discussed with: CRNA, Anesthesiologist and Surgeon  Anesthesia Plan Comments:         Anesthesia Quick Evaluation

## 2015-01-30 NOTE — Brief Op Note (Signed)
01/30/2015  2:39 PM  PATIENT:  Lisa Long  49 y.o. female  PRE-OPERATIVE DIAGNOSIS:  SCLERADERMA RIGHT HAND  POST-OPERATIVE DIAGNOSIS:  Holden RIGHT HAND  PROCEDURE:  Procedure(s): DIGITAL SYMPATHECTOMY, RIGHT RADIAL ARTERY, ULNAR ARTERY, ARCH DIGITAL VESSELS INDEX MIDDLE RING SMALL GRAFT ULNAR ARTERY  (Right)  SURGEON:  Surgeon(s) and Role:    * [name], MD - Primary    * Leanora Cover, MD - Assisting  PHYSICIAN ASSISTANT:   ASSISTANTS: K Jilliana Burkes,MD   ANESTHESIA:   regional and general  EBL:  Total I/O In: 1700 [I.V.:1700] Out: -   BLOOD ADMINISTERED:none  DRAINS: none   LOCAL MEDICATIONS USED:  NONE  SPECIMEN:  Excision  DISPOSITION OF SPECIMEN:  PATHOLOGY  COUNTS:  YES  TOURNIQUET:   Total Tourniquet Time Documented: Upper Arm (Right) - 99 minutes Total: Upper Arm (Right) - 99 minutes   DICTATION: .Other Dictation: Dictation Number [id number]  PLAN OF CARE: Discharge to home after PACU  PATIENT DISPOSITION:  PACU - hemodynamically stable.

## 2015-01-30 NOTE — Op Note (Signed)
Dictation Number [id number]

## 2015-01-30 NOTE — Transfer of Care (Signed)
Immediate Anesthesia Transfer of Care Note  Patient: Laretta Alstrom  Procedure(s) Performed: Procedure(s): DIGITAL SYMPATHECTOMY, RIGHT RADIAL ARTERY, ULNAR ARTERY, ARCH DIGITAL VESSELS INDEX MIDDLE RING SMALL GRAFT ULNAR ARTERY  (Right)  Patient Location: PACU  Anesthesia Type:GA combined with regional for post-op pain  Level of Consciousness: awake, alert , oriented and patient cooperative  Airway & Oxygen Therapy: Patient Spontanous Breathing and Patient connected to face mask oxygen  Post-op Assessment: Report given to RN, Post -op Vital signs reviewed and stable and Patient moving all extremities  Post vital signs: Reviewed and stable  Last Vitals:  Filed Vitals:   01/30/15 1105  BP: 100/64  Pulse: 90  Temp:   Resp: 35    Complications: No apparent anesthesia complications

## 2015-01-31 ENCOUNTER — Encounter (HOSPITAL_BASED_OUTPATIENT_CLINIC_OR_DEPARTMENT_OTHER): Payer: Self-pay | Admitting: Orthopedic Surgery

## 2015-01-31 NOTE — Op Note (Signed)
NAME:  Lisa Long, Lisa Long NO.:  [id number]  MEDICAL RECORD NO.:  [id number]  LOCATION:                                 FACILITY:  PHYSICIAN:  [name], M.D.       DATE OF BIRTH:  [birth year]  DATE OF PROCEDURE:  01/30/2015 DATE OF DISCHARGE:                              OPERATIVE REPORT   PREOPERATIVE DIAGNOSIS:  Scleroderma with vascular insufficiency, middle finger with an ulcer, right hand; thrombosis ulnar artery.  POSTOPERATIVE DIAGNOSIS:  Scleroderma with vascular insufficiency, middle finger with an ulcer, right hand; thrombosis ulnar artery.  OPERATION:  Parasympathectomy, radial artery, ulnar artery, superficial palmar arch, common digital arteries; middle, ring, and small fingers. Vein graft to thrombosed ulnar artery.  SURGEON:  [name], M.D.  ASSISTANT:  Leanora Cover, MD  ANESTHESIA:  General with supraclavicular block.  HISTORY:  The patient is a 49 year old female with a history of pain, vascular changes to the middle finger, right hand with positive arteriogram revealing thrombosis of her ulnar artery.  She has scleroderma and is admitted for sympathectomies of the radial and ulnar arteries, the superficial palmar arch, common digital arteries with possible reconstruction ulnar artery with vein graft, right arm.  Vein graft was used from the distal forearm.  She is aware of risks and complications including infection; recurrence of injury to arteries, nerves, tendons; incomplete relief of symptoms; dystrophy.  In the preoperative area, the patient is seen, the extremity marked by both the patient and surgeon.  Antibiotic given.  PROCEDURE IN DETAIL:  The patient was brought to the operating room, where a supraclavicular block, which was carried out in the preoperative area was added to with a general anesthetic.  She was prepped and draped using ChloraPrep supine position with the right arm free.  A 3-minute dry time was allowed.   Time-out taken, confirming the patient and procedure.  The limb was exsanguinated with an Esmarch bandage. Tourniquet placed high on the arm was inflated to 250 mmHg.  The radial artery was approached first.  A longitudinal incision was made on the volar radial aspect of her right wrist, carried down through subcutaneous tissue to the radial side of the flexor carpi radialis, the dissection was carried down.  The radial artery was identified along with venae comitantes superficial branch, this was isolated with blunt and sharp dissection.  A separate incision was then made over the carpometacarpal area dorsally on the right hand, carried down through subcutaneous.  Radial and sensory nerves identified and protected.  The radial artery was identified dorsally.  A separate incision was then made on the ulnar aspect of the wrist to the radial side of flexor carpi radialis.  Dissection was carried down revealing the ulnar artery and nerve.  The artery was isolated with blunt and sharp dissection.  A separate incision was then made in the palm, carried distally.  The ulnar artery was identified through Guyon's canal.  This was found to be thrombosed.  Dissection was carried across the palm of the hand revealing the superficial palmar arch and the common digital arteries to the middle, ring, and small fingers.  The arteries to the middle finger were  extremely small.  The operative microscope was then brought into position.  Sympathectomy was then performed to the radial artery the and volar radial aspect using micro dissection.  The dorsal branch of the radial artery on the dorsum of the hand giving rise to princeps pollicis was also stripped of its adventitia down to muscularis with microsurgical instruments under the operative microscope completing the radial artery sympathectomy.  The ulnar artery was then attended to at the wrist.  This again was isolated using microsurgical  techniques. Stripping of the adventitia down the muscularis was performed.  Each of these were over a segment of approximately 2 to 3 cm in length.  The superficial arch was then attended to next.  This was stripped similar to the radial and ulnar arteries using microsurgical techniques including the common digitals to the middle, ring, and small fingers. The thrombosed segment of the ulnar artery was then resected.  This was a segment approximately 4 cm in length.  A volar vein was then selected having been marked prior to inflation of the tourniquet.  The branches were coagulated, this was then reversed.  Sutured into position with interrupted 9-0 nylon sutures proximally and distally.  The wounds on the forearm and the dorsum of the hand were then closed with interrupted 4-0 nylon sutures.  The tourniquet was deflated.  The reactive hyperemia was allowed to wait.  The vein graft appeared patent.  The wound in the palm was then closed with interrupted 4-0 nylon sutures.  A sterile compressive dressing and palmar splint was applied.  When the tourniquet was deflated, all fingers immediately pinked following deflation.  The patient tolerated the procedure well, and was taken to the recovery room for observation in a satisfactory condition.  She will be discharged home to return to the [facility] in 1 week on Percocet and aspirin.          ______________________________ [name], M.D.     GK/MEDQ  D:  01/30/2015  T:  01/31/2015  Job:  [id number]

## 2015-02-07 ENCOUNTER — Ambulatory Visit (HOSPITAL_COMMUNITY): Payer: 59 | Attending: Internal Medicine

## 2015-02-17 ENCOUNTER — Other Ambulatory Visit (HOSPITAL_COMMUNITY): Payer: Self-pay | Admitting: Respiratory Therapy

## 2015-02-26 ENCOUNTER — Ambulatory Visit (HOSPITAL_COMMUNITY)
Admission: RE | Admit: 2015-02-26 | Discharge: 2015-02-26 | Disposition: A | Discharge status: Home / Self Care | Payer: 59 | Source: Ambulatory Visit | Attending: Internal Medicine | Admitting: Internal Medicine

## 2015-02-26 DIAGNOSIS — I10 Essential (primary) hypertension: Secondary | ICD-10-CM | POA: Insufficient documentation

## 2015-02-26 LAB — BASIC METABOLIC PANEL
Anion gap: 13 (ref 5–15)
BUN: 8 mg/dL (ref 6–20)
CALCIUM: 9.9 mg/dL (ref 8.9–10.3)
CHLORIDE: 102 mmol/L (ref 101–111)
CO2: 25 mmol/L (ref 22–32)
CREATININE: 0.59 mg/dL (ref 0.44–1.00)
GFR calc Af Amer: >60 mL/min (ref >60)
GFR calc non Af Amer: >60 mL/min (ref >60)
GLUCOSE: 97 mg/dL (ref 65–99)
Potassium: 3.8 mmol/L (ref 3.5–5.1)
Sodium: 140 mmol/L (ref 135–145)

## 2015-03-04 ENCOUNTER — Ambulatory Visit (HOSPITAL_COMMUNITY)
Admission: RE | Admit: 2015-03-04 | Discharge: 2015-03-04 | Disposition: A | Discharge status: Home / Self Care | Payer: 59 | Source: Ambulatory Visit | Attending: Internal Medicine | Admitting: Internal Medicine

## 2015-03-04 ENCOUNTER — Encounter (HOSPITAL_COMMUNITY): Payer: Self-pay

## 2015-03-04 DIAGNOSIS — R634 Abnormal weight loss: Secondary | ICD-10-CM | POA: Insufficient documentation

## 2015-03-04 DIAGNOSIS — F17200 Nicotine dependence, unspecified, uncomplicated: Secondary | ICD-10-CM | POA: Insufficient documentation

## 2015-03-04 DIAGNOSIS — R0602 Shortness of breath: Secondary | ICD-10-CM | POA: Diagnosis not present

## 2015-03-04 DIAGNOSIS — M349 Systemic sclerosis, unspecified: Secondary | ICD-10-CM | POA: Insufficient documentation

## 2015-03-04 DIAGNOSIS — R918 Other nonspecific abnormal finding of lung field: Secondary | ICD-10-CM | POA: Insufficient documentation

## 2015-03-04 DIAGNOSIS — K760 Fatty (change of) liver, not elsewhere classified: Secondary | ICD-10-CM | POA: Diagnosis not present

## 2015-03-04 DIAGNOSIS — K224 Dyskinesia of esophagus: Secondary | ICD-10-CM | POA: Insufficient documentation

## 2015-03-04 DIAGNOSIS — I2510 Atherosclerotic heart disease of native coronary artery without angina pectoris: Secondary | ICD-10-CM | POA: Insufficient documentation

## 2015-05-02 MED FILL — predniSONE 5 MG TABS: 5 | 28 days supply | Qty: 46 | Fill #0

## 2015-05-29 DIAGNOSIS — I7300 Raynaud's syndrome without gangrene: Secondary | ICD-10-CM | POA: Diagnosis not present

## 2015-05-29 DIAGNOSIS — L98499 Non-pressure chronic ulcer of skin of other sites with unspecified severity: Secondary | ICD-10-CM | POA: Diagnosis not present

## 2015-05-29 DIAGNOSIS — Z09 Encounter for follow-up examination after completed treatment for conditions other than malignant neoplasm: Secondary | ICD-10-CM | POA: Diagnosis not present

## 2015-05-29 DIAGNOSIS — M349 Systemic sclerosis, unspecified: Secondary | ICD-10-CM | POA: Diagnosis not present

## 2015-06-10 ENCOUNTER — Telehealth: Payer: Self-pay | Admitting: Neurology

## 2015-06-10 DIAGNOSIS — M341 CR(E)ST syndrome: Secondary | ICD-10-CM

## 2015-06-10 NOTE — Telephone Encounter (Signed)
I have talked with the patient, she wishes a referral to Dr. Amil Amen, I will try to get this set up. I called their office, they wish [place] to send over records or a referral.

## 2015-06-13 MED FILL — AMLODIPINE BESYLATE 5 MG TA: 5 | 30 days supply | Qty: 30 | Fill #2

## 2015-06-13 MED FILL — SILDENAFIL 20 MG TABLET: 20 | 30 days supply | Qty: 60 | Fill #0

## 2015-06-25 DIAGNOSIS — M349 Systemic sclerosis, unspecified: Secondary | ICD-10-CM | POA: Diagnosis not present

## 2015-06-25 DIAGNOSIS — I7300 Raynaud's syndrome without gangrene: Secondary | ICD-10-CM | POA: Diagnosis not present

## 2015-06-25 DIAGNOSIS — M79641 Pain in right hand: Secondary | ICD-10-CM | POA: Diagnosis not present

## 2015-06-25 MED FILL — GABAPENTIN 100 MG CAPSULE: 100 | 30 days supply | Qty: 90 | Fill #0

## 2015-06-25 MED FILL — NIFEDIPINE ER 30 MG TABLET: 30 | 30 days supply | Qty: 30 | Fill #0

## 2015-06-25 MED FILL — NITRO-BID 2% OINTMENT: 2 | 30 days supply | Qty: 30 | Fill #0

## 2015-07-25 DIAGNOSIS — M47812 Spondylosis without myelopathy or radiculopathy, cervical region: Secondary | ICD-10-CM | POA: Diagnosis not present

## 2015-07-25 DIAGNOSIS — M349 Systemic sclerosis, unspecified: Secondary | ICD-10-CM | POA: Diagnosis not present

## 2015-07-25 DIAGNOSIS — I7300 Raynaud's syndrome without gangrene: Secondary | ICD-10-CM | POA: Diagnosis not present

## 2015-07-29 MED FILL — PANTOPRAZOLE SOD DR 40 MG T: 40 | 30 days supply | Qty: 30 | Fill #2

## 2015-07-29 MED FILL — NIFEDIPINE ER 30 MG TABLET: 30 | 30 days supply | Qty: 30 | Fill #1

## 2015-07-29 MED FILL — GABAPENTIN 100 MG CAPSULE: 100 | 30 days supply | Qty: 90 | Fill #1

## 2015-09-08 MED FILL — NIFEDIPINE ER 30 MG TABLET: 30 | 30 days supply | Qty: 30 | Fill #0

## 2015-09-29 MED FILL — PANTOPRAZOLE SOD DR 40 MG T: 40 | 30 days supply | Qty: 30 | Fill #3

## 2015-09-29 MED FILL — NIFEDIPINE ER 30 MG TABLET: 30 | 30 days supply | Qty: 30 | Fill #1

## 2015-09-29 MED FILL — GABAPENTIN 100 MG CAPSULE: 100 | 30 days supply | Qty: 90 | Fill #2

## 2015-10-28 MED FILL — AMLODIPINE BESYLATE 5 MG TA: 5 | 90 days supply | Qty: 90 | Fill #0

## 2015-10-30 MED FILL — LOSARTAN POTASSIUM 25 MG TA: 25 | 30 days supply | Qty: 30 | Fill #0

## 2015-11-25 ENCOUNTER — Other Ambulatory Visit: Payer: Self-pay | Admitting: Neurology

## 2015-11-25 ENCOUNTER — Ambulatory Visit (INDEPENDENT_AMBULATORY_CARE_PROVIDER_SITE_OTHER): Payer: Self-pay | Admitting: Neurology

## 2015-11-25 DIAGNOSIS — R29898 Other symptoms and signs involving the musculoskeletal system: Secondary | ICD-10-CM

## 2015-11-25 MED ORDER — GABAPENTIN 100 MG PO CAPS: 100.0000 mg | ORAL_CAPSULE | Freq: Three times a day (TID) | ORAL | 1 refills | Status: DC

## 2015-11-25 MED FILL — GABAPENTIN 100 MG CAPSULE: 100 | 90 days supply | Qty: 270 | Fill #0

## 2015-11-25 NOTE — Procedures (Signed)
     HISTORY:  Lisa Long is a 50 year old patient with a history of scleroderma and Raynaud phenomena. The patient has had some weakness and fatigue of the arms and legs over the last 6 months that is gradually worsening. She is having increasing problems going upstairs or walking the dog. CK enzyme levels are normal. The patient is being evaluated to exclude a possible inflammatory myopathy.  NERVE CONDUCTION STUDIES:  Nerve conduction studies were not performed.  EMG STUDIES:  A limited EMG study was performed on the right upper extremity:  The biceps muscle reveals 1 to 2 K units with full recruitment. No fibrillations or positive waves were noted. The triceps muscle reveals 2 to 4 K units with full recruitment. No fibrillations or positive waves were noted. Occasional complex repetitive discharges were seen. The anterior deltoid muscle reveals 2 to 3 K units with full recruitment. No fibrillations or positive waves were noted. The cervical paraspinal muscles were tested at 2 levels. No abnormalities of insertional activity were seen at either level tested. Complex repetitive discharges were seen at the upper level. There was good relaxation.  A limited EMG study was performed on the right lower extremity:  The vastus lateralis muscle reveals 2 to 4K motor units with full recruitment. No fibrillations or positive waves were seen. The iliopsoas muscle reveals 2 to 4K motor units with full recruitment. No fibrillations or positive waves were seen. The lumbosacral paraspinal muscles were tested at 3 levels, and revealed no abnormalities of insertional activity at all 3 levels tested. There was good relaxation.   IMPRESSION:  Nerve conduction studies were not performed. A limited EMG evaluation of the right upper extremity and right lower extremity were relatively unremarkable. Some complex repetitive discharges were seen on the EMG of the right upper extremity, possibly relating to  a chronic cervical radiculopathy, possibly at the C7 level. There is no evidence of a myopathic process on examination today.  Jill Alexanders MD 11/25/2015 12:49 PM  [facility] [address] [place], [place] [address]  Phone [phone] Fax [phone]

## 2015-12-02 DIAGNOSIS — M349 Systemic sclerosis, unspecified: Secondary | ICD-10-CM | POA: Diagnosis not present

## 2015-12-02 DIAGNOSIS — I7300 Raynaud's syndrome without gangrene: Secondary | ICD-10-CM | POA: Diagnosis not present

## 2015-12-03 MED FILL — OMEPRAZOLE DR 20 MG CAPSULE: 20 | 30 days supply | Qty: 30 | Fill #0

## 2015-12-04 MED FILL — HYDROCODON-APAP 5-325: 5-325 | 10 days supply | Qty: 30 | Fill #0

## 2015-12-23 MED FILL — FLUoxetine HCL 10 MG TABS: 10 | 30 days supply | Qty: 60 | Fill #0

## 2015-12-24 MED FILL — HYDROCODON-APAP 5-325: 5-325 | 15 days supply | Qty: 60 | Fill #0

## 2016-01-12 ENCOUNTER — Telehealth (HOSPITAL_COMMUNITY): Payer: Self-pay | Admitting: Internal Medicine

## 2016-01-12 NOTE — Telephone Encounter (Signed)
Pt has been contacted a few times(10/3,10/4,10/9) to get her Echo scheduled and have yet to receive a call back. Dr. Haroldine Laws  has been notified and she will be removed from the workqueue. If in fact the patient does call back the test will be reordered by the ordering physician.

## 2016-01-18 ENCOUNTER — Encounter (HOSPITAL_COMMUNITY): Payer: Self-pay | Admitting: Emergency Medicine

## 2016-01-18 ENCOUNTER — Ambulatory Visit (HOSPITAL_COMMUNITY)
Admission: EM | Admit: 2016-01-18 | Discharge: 2016-01-18 | Disposition: A | Discharge status: Home / Self Care | Payer: 59 | Attending: Internal Medicine | Admitting: Internal Medicine

## 2016-01-18 DIAGNOSIS — L03012 Cellulitis of left finger: Secondary | ICD-10-CM

## 2016-01-18 DIAGNOSIS — L98492 Non-pressure chronic ulcer of skin of other sites with fat layer exposed: Secondary | ICD-10-CM | POA: Diagnosis not present

## 2016-01-18 MED ORDER — HYDROMORPHONE HCL 1 MG/ML IJ SOLN
1.0000 mg | Freq: Once | INTRAMUSCULAR | Status: AC
  Administered 2016-01-18: 1 mg via INTRAMUSCULAR

## 2016-01-18 MED ORDER — SULFAMETHOXAZOLE-TRIMETHOPRIM 800-160 MG PO TABS: 1.0000 | ORAL_TABLET | Freq: Two times a day (BID) | ORAL | 0 refills | Status: AC

## 2016-01-18 MED ORDER — HYDROCODONE-ACETAMINOPHEN 5-325 MG PO TABS: 2.0000 | ORAL_TABLET | ORAL | 0 refills | Status: DC | PRN

## 2016-01-18 MED ORDER — LIDOCAINE HCL (PF) 1 % IJ SOLN
INTRAMUSCULAR | Status: AC
  Filled 2016-01-18: qty 2

## 2016-01-18 MED ORDER — CEFTRIAXONE SODIUM 1 G IJ SOLR
INTRAMUSCULAR | Status: AC
  Filled 2016-01-18: qty 10

## 2016-01-18 MED ORDER — HYDROMORPHONE HCL 1 MG/ML IJ SOLN
INTRAMUSCULAR | Status: AC
  Filled 2016-01-18: qty 1

## 2016-01-18 MED ORDER — CEFTRIAXONE SODIUM 1 G IJ SOLR
1.0000 g | Freq: Once | INTRAMUSCULAR | Status: AC
  Administered 2016-01-18: 1 g via INTRAMUSCULAR

## 2016-01-18 MED ORDER — CEPHALEXIN 500 MG PO CAPS: 500.0000 mg | ORAL_CAPSULE | Freq: Two times a day (BID) | ORAL | 0 refills | Status: DC

## 2016-01-18 NOTE — ED Notes (Signed)
Patient observed after medication administration and no signs of reaction reported.

## 2016-01-18 NOTE — ED Triage Notes (Signed)
The patient presented to the [facility] with a complaint of a wound to her left index finger that has been present for 5 days. 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

## 2016-01-18 NOTE — ED Provider Notes (Signed)
[facility]    CSN: [id number] Arrival date & time: 01/18/16  1218     History   Chief Complaint Chief Complaint  Patient presents with  . Wound Check  . Hand Pain    HPI Lisa Long is a 50 y.o. female. She has a history of CREST syndrome, followed by a [facility] rheumatologist, Dr. Flonnie Overman. She had a sympathectomy of the right hand with Dr. Fredna Dow October 2016. She presents today with a five-day history of increasing pain and swelling in the distal left second finger, where a long-standing digital ulcer had been present. No fever, no malaise. No drainage from the finger. She has been applying antibiotic ointment to it. It is so painful in the last 24 hours that she cannot stand it. Did not sleep last night. The distal fingertip has become purple/blackish.  HPI  Past Medical History:  Diagnosis Date  . Cervical spondylosis without myelopathy   . Esophageal stricture - GERD 07/23/2014  . Folate deficiency anemia   . GERD (gastroesophageal reflux disease)   . Hx of adenomatous polyp of colon 10/14/2014  . Hypertension   . IBS (irritable bowel syndrome)   . Raynaud's disease, idiopathic   . Seasonal allergies   . Tobacco use   . Vitamin B12 deficiency     Patient Active Problem List   Diagnosis Date Noted  . Abnormal PFTs (pulmonary function tests) 01/24/2015  . Scleroderma ([place])   . Hx of adenomatous polyp of colon 10/14/2014  . Visit for screening mammogram 07/23/2014  . Routine general medical examination at a health care facility 07/23/2014  . Esophageal stricture - GERD 07/23/2014  . Raynaud's disease, idiopathic 01/10/2014  . Cervical spondylosis without myelopathy 03/07/2013  . Alcoholic hepatitis 0000000  . 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 IBS (irritable bowel syndrome) 02/24/2011  . TOBACCO USE 12/31/2009  . GERD 12/31/2009  . Hyperglycemia 03/14/2008  . 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 Essential hypertension, benign  02/12/2008    Past Surgical History:  Procedure Laterality Date  . ABDOMINAL HYSTERECTOMY    . BREAST SURGERY     benign breast cysts  . CERVICAL FUSION    . COLONOSCOPY    . DERMOID CYST  EXCISION    . 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 HEMORRHOID SURGERY    . NAILBED REPAIR Right 01/30/2013   Procedure: BIOPSY OF RIGHT LONG NAIL FOLD;  Surgeon: [name]., MD;  Location: [facility];  Service: Orthopedics;  Laterality: Right;  Penrose used as tourniquet--on at 0944, off at 1001.  . OOPHORECTOMY    . SYMPATHECTOMY Right 01/30/2015   Procedure: DIGITAL SYMPATHECTOMY, RIGHT RADIAL ARTERY, ULNAR ARTERY, ARCH DIGITAL VESSELS INDEX MIDDLE RING SMALL GRAFT ULNAR ARTERY ;  Surgeon: [name], MD;  Location: [facility];  Service: Orthopedics;  Laterality: Right;       Home Medications    Prior to Admission medications   Medication Sig Start Date End Date Taking? Authorizing Provider  acetaminophen (TYLENOL) 500 MG tablet Take 1,000 mg by mouth daily as needed for mild pain, moderate pain, fever or headache.   Yes Historical Provider, MD  amLODipine (NORVASC) 5 MG tablet Take 5 mg by mouth daily.   Yes Historical Provider, MD  FLUoxetine (PROZAC) 10 MG tablet Take 10 mg by mouth daily.   Yes Historical Provider, MD  gabapentin (NEURONTIN) 100 MG capsule Take 1 capsule (100 mg total)  by mouth 3 (three) times daily. 11/25/15  Yes Kathrynn Ducking, MD  cephALEXin (KEFLEX) 500 MG capsule Take 1 capsule (500 mg total) by mouth 2 (two) times daily. 01/18/16   Sherlene Shams, MD  Cyanocobalamin 500 MCG/0.1ML SOLN Place 0.1 mLs (500 mcg total) into the nose once a week. 01/10/14   Janith Lima, MD  HYDROcodone-acetaminophen (NORCO/VICODIN) 5-325 MG tablet Take 2 tablets by mouth every 4 (four) hours as needed. 01/18/16   Sherlene Shams, MD  oxyCODONE-acetaminophen (PERCOCET) 10-325 MG tablet Take 1 tablet by mouth every 4 (four) hours as needed for  pain. 01/30/15   [name], MD  pantoprazole (PROTONIX) 40 MG tablet Take 1 tablet (40 mg total) by mouth daily before breakfast. 10/04/14   Gatha Mayer, MD  sildenafil (REVATIO) 20 MG tablet Take 20 mg by mouth daily.    Historical Provider, MD  sulfamethoxazole-trimethoprim (BACTRIM DS,SEPTRA DS) 800-160 MG tablet Take 1 tablet by mouth 2 (two) times daily. 01/18/16 01/28/16  Sherlene Shams, MD  traMADol (ULTRAM) 50 MG tablet Take 50 mg by mouth 2 (two) times daily as needed for moderate pain.    Historical Provider, MD    Family History Family History  Problem Relation Age of Onset  . ALS Mother   . Diabetes Other   . Hypertension Other   . Stroke Other     F 1st degree relative <60    Social History Social History  Substance Use Topics  . Smoking status: Current Some Day Smoker    Packs/day: 0.50    Years: 25.00    Types: Cigarettes    Last attempt to quit: 01/29/2015  . Smokeless tobacco: Never Used     Comment: Currently attending [place] [facility] program  . Alcohol use 12.0 oz/week    20 Glasses of wine per week     Comment: 1-2 glasses wine daily     Allergies   Review of patient's allergies indicates no known allergies.   Review of Systems Review of Systems  All other systems reviewed and are negative.    Physical Exam Triage Vital Signs ED Triage Vitals  Enc Vitals Group     BP 01/18/16 1250 136/95     Pulse Rate 01/18/16 1250 102     Resp 01/18/16 1250 18     Temp 01/18/16 1250 98.6 F (37 C)     Temp Source 01/18/16 1250 Oral     SpO2 01/18/16 1250 98 %     Weight --      Height --      Head Circumference --      Peak Flow --      Pain Score 01/18/16 1253 9   Updated Vital Signs BP 136/95 (BP Location: Left Arm)   Pulse 102   Temp 98.6 F (37 C) (Oral)   Resp 18   SpO2 98%  Physical Exam  Constitutional: She is oriented to person, place, and time. No distress.  Alert, nicely groomed  HENT:  Head: Atraumatic.  Eyes:    Conjugate gaze, no eye redness/drainage  Neck: Neck supple.  Cardiovascular: Normal rate.   Pulmonary/Chest: No respiratory distress.  Abdominal: She exhibits no distension.  Musculoskeletal: Normal range of motion.  No leg swelling  Neurological: She is alert and oriented to person, place, and time.  Skin: Skin is warm and dry.  No cyanosis The distal left second finger is purplish, somewhat shrunken, with a blackened area ventrally. The  wound looks moist around the blackened area, and there is some yellowish material, possibly exposed subcutaneous fat. Proximal to the purplish area, there is erythema and swelling extending to the DIP joint. The fingertip is exquisitely tender. There is an ulcer present under the right second fingernail and right third fingernail as well, but without the extreme changes observed in the left hand.  Nursing note and vitals reviewed.    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  Followup with Dr Levell July office and Dr Lillie Columbia office tomorrow, Monday 10/16, to let them know that left 2nd fingertip appears to be ischemic, with necrotic area and suspected infection/cellulitis just proximal.  Confined to distal aspect of finger at this point, past DIP joint.  No fever/malaise to suggest systemic infection. Prescriptions for trimethoprim/sulfa (to cover MRSA) and cephalexin (to cover strep) sent to [facility] on [address].  Prescription for #20 vicodin printed.   Injections of rocephin (1g) and dilaudid (1mg ) given at urgent care today.  New  Prescriptions Discharge Medication List as of 01/18/2016  1:46 PM    START taking these medications   Details  cephALEXin (KEFLEX) 500 MG capsule Take 1 capsule (500 mg total) by mouth 2 (two) times daily., Starting Sun 01/18/2016, Normal    HYDROcodone-acetaminophen (NORCO/VICODIN) 5-325 MG tablet Take 2 tablets by mouth every 4 (four) hours as needed., Starting Sun 01/18/2016, Print    sulfamethoxazole-trimethoprim (BACTRIM DS,SEPTRA DS) 800-160 MG tablet Take 1 tablet by mouth 2 (two) times daily., Starting Sun 01/18/2016, Until Wed 01/28/2016, Normal         Sherlene Shams, MD 01/22/16 [phone]

## 2016-01-18 NOTE — Discharge Instructions (Addendum)
Keep fingertip dry.  Followup with Dr Levell July office and Dr Lillie Columbia office tomorrow, Monday 10/16, to let them know that left 2nd fingertip appears to be ischemic, with necrotic area and suspected infection/cellulitis just proximal.  Confined to distal aspect of finger at this point, past DIP joint.  No fever/malaise to suggest systemic infection. Prescriptions for trimethoprim/sulfa (to cover MRSA) and cephalexin (to cover strep) sent to [facility] on [address].  Prescription for #20 vicodin printed.   Injections of rocephin (1g) and dilaudid (1mg ) given at urgent care today.

## 2016-01-19 DIAGNOSIS — M349 Systemic sclerosis, unspecified: Secondary | ICD-10-CM | POA: Diagnosis not present

## 2016-01-19 DIAGNOSIS — M79642 Pain in left hand: Secondary | ICD-10-CM | POA: Diagnosis not present

## 2016-01-19 DIAGNOSIS — I96 Gangrene, not elsewhere classified: Secondary | ICD-10-CM | POA: Diagnosis not present

## 2016-01-19 MED FILL — OMEPRAZOLE DR 20 MG CAPSULE: 20 | 30 days supply | Qty: 30 | Fill #1

## 2016-01-19 MED FILL — CELECOXIB 200 MG CAPSULE: 200 | 30 days supply | Qty: 30 | Fill #0

## 2016-01-19 MED FILL — AMLODIPINE BESYLATE 5 MG TA: 5 | 90 days supply | Qty: 90 | Fill #1

## 2016-01-20 ENCOUNTER — Other Ambulatory Visit (HOSPITAL_COMMUNITY): Payer: Self-pay | Admitting: *Deleted

## 2016-01-20 ENCOUNTER — Other Ambulatory Visit (HOSPITAL_COMMUNITY): Payer: Self-pay | Admitting: Orthopedic Surgery

## 2016-01-20 DIAGNOSIS — M349 Systemic sclerosis, unspecified: Secondary | ICD-10-CM

## 2016-01-28 ENCOUNTER — Other Ambulatory Visit: Payer: Self-pay | Admitting: Physician Assistant

## 2016-01-28 ENCOUNTER — Ambulatory Visit (HOSPITAL_BASED_OUTPATIENT_CLINIC_OR_DEPARTMENT_OTHER): Payer: 59

## 2016-01-28 ENCOUNTER — Other Ambulatory Visit: Payer: Self-pay

## 2016-01-28 DIAGNOSIS — E538 Deficiency of other specified B group vitamins: Secondary | ICD-10-CM | POA: Diagnosis not present

## 2016-01-28 DIAGNOSIS — F17210 Nicotine dependence, cigarettes, uncomplicated: Secondary | ICD-10-CM | POA: Diagnosis not present

## 2016-01-28 DIAGNOSIS — Z833 Family history of diabetes mellitus: Secondary | ICD-10-CM | POA: Diagnosis not present

## 2016-01-28 DIAGNOSIS — M349 Systemic sclerosis, unspecified: Secondary | ICD-10-CM

## 2016-01-28 DIAGNOSIS — Z981 Arthrodesis status: Secondary | ICD-10-CM | POA: Diagnosis not present

## 2016-01-28 DIAGNOSIS — Z86010 Personal history of colonic polyps: Secondary | ICD-10-CM | POA: Diagnosis not present

## 2016-01-28 DIAGNOSIS — Z823 Family history of stroke: Secondary | ICD-10-CM | POA: Diagnosis not present

## 2016-01-28 DIAGNOSIS — K219 Gastro-esophageal reflux disease without esophagitis: Secondary | ICD-10-CM | POA: Diagnosis not present

## 2016-01-28 DIAGNOSIS — I10 Essential (primary) hypertension: Secondary | ICD-10-CM | POA: Diagnosis not present

## 2016-01-28 DIAGNOSIS — Z8249 Family history of ischemic heart disease and other diseases of the circulatory system: Secondary | ICD-10-CM | POA: Diagnosis not present

## 2016-01-28 DIAGNOSIS — M341 CR(E)ST syndrome: Secondary | ICD-10-CM | POA: Diagnosis not present

## 2016-01-28 DIAGNOSIS — K589 Irritable bowel syndrome without diarrhea: Secondary | ICD-10-CM | POA: Diagnosis not present

## 2016-01-28 DIAGNOSIS — I7300 Raynaud's syndrome without gangrene: Secondary | ICD-10-CM | POA: Diagnosis not present

## 2016-01-29 ENCOUNTER — Ambulatory Visit (HOSPITAL_COMMUNITY)
Admission: RE | Admit: 2016-01-29 | Discharge: 2016-01-29 | Disposition: A | Discharge status: Home / Self Care | Payer: 59 | Source: Ambulatory Visit | Attending: Orthopedic Surgery | Admitting: Orthopedic Surgery

## 2016-01-29 ENCOUNTER — Other Ambulatory Visit (HOSPITAL_COMMUNITY): Payer: Self-pay | Admitting: Orthopedic Surgery

## 2016-01-29 ENCOUNTER — Encounter (HOSPITAL_COMMUNITY): Payer: Self-pay | Admitting: *Deleted

## 2016-01-29 DIAGNOSIS — I70208 Unspecified atherosclerosis of native arteries of extremities, other extremity: Secondary | ICD-10-CM | POA: Diagnosis not present

## 2016-01-29 DIAGNOSIS — I10 Essential (primary) hypertension: Secondary | ICD-10-CM | POA: Insufficient documentation

## 2016-01-29 DIAGNOSIS — M341 CR(E)ST syndrome: Secondary | ICD-10-CM | POA: Insufficient documentation

## 2016-01-29 DIAGNOSIS — K219 Gastro-esophageal reflux disease without esophagitis: Secondary | ICD-10-CM | POA: Diagnosis not present

## 2016-01-29 DIAGNOSIS — K589 Irritable bowel syndrome without diarrhea: Secondary | ICD-10-CM | POA: Diagnosis not present

## 2016-01-29 DIAGNOSIS — I7300 Raynaud's syndrome without gangrene: Secondary | ICD-10-CM | POA: Insufficient documentation

## 2016-01-29 DIAGNOSIS — Z8249 Family history of ischemic heart disease and other diseases of the circulatory system: Secondary | ICD-10-CM | POA: Insufficient documentation

## 2016-01-29 DIAGNOSIS — M349 Systemic sclerosis, unspecified: Secondary | ICD-10-CM

## 2016-01-29 DIAGNOSIS — Z833 Family history of diabetes mellitus: Secondary | ICD-10-CM | POA: Insufficient documentation

## 2016-01-29 DIAGNOSIS — Z823 Family history of stroke: Secondary | ICD-10-CM | POA: Diagnosis not present

## 2016-01-29 DIAGNOSIS — E538 Deficiency of other specified B group vitamins: Secondary | ICD-10-CM | POA: Diagnosis not present

## 2016-01-29 DIAGNOSIS — Z86010 Personal history of colonic polyps: Secondary | ICD-10-CM | POA: Diagnosis not present

## 2016-01-29 DIAGNOSIS — Z981 Arthrodesis status: Secondary | ICD-10-CM | POA: Insufficient documentation

## 2016-01-29 DIAGNOSIS — F17210 Nicotine dependence, cigarettes, uncomplicated: Secondary | ICD-10-CM | POA: Insufficient documentation

## 2016-01-29 HISTORY — PX: IR GENERIC HISTORICAL: IMG1180011

## 2016-01-29 LAB — CBC
HEMATOCRIT: 37.8 % (ref 36.0–46.0)
Hemoglobin: 13.4 g/dL (ref 12.0–15.0)
MCH: 33.4 pg (ref 26.0–34.0)
MCHC: 35.4 g/dL (ref 30.0–36.0)
MCV: 94.3 fL (ref 78.0–100.0)
Platelets: 149 10*3/uL — ABNORMAL LOW (ref 150–400)
RBC: 4.01 MIL/uL (ref 3.87–5.11)
RDW: 14.4 % (ref 11.5–15.5)
WBC: 8.2 10*3/uL (ref 4.0–10.5)

## 2016-01-29 LAB — APTT: APTT: 29 s (ref 24–36)

## 2016-01-29 LAB — BASIC METABOLIC PANEL
ANION GAP: 13 (ref 5–15)
BUN: 8 mg/dL (ref 6–20)
CHLORIDE: 100 mmol/L — AB (ref 101–111)
CO2: 24 mmol/L (ref 22–32)
Calcium: 9.8 mg/dL (ref 8.9–10.3)
Creatinine, Ser: 0.63 mg/dL (ref 0.44–1.00)
GFR calc non Af Amer: >60 mL/min (ref >60)
Glucose, Bld: 96 mg/dL (ref 65–99)
POTASSIUM: 3.8 mmol/L (ref 3.5–5.1)
Sodium: 137 mmol/L (ref 135–145)

## 2016-01-29 LAB — PROTIME-INR
INR: 0.95
Prothrombin Time: 12.6 seconds (ref 11.4–15.2)

## 2016-01-29 MED ORDER — IODIXANOL 320 MG/ML IV SOLN
INTRAVENOUS | Status: AC | PRN
  Administered 2016-01-29: 100 mL via INTRA_ARTERIAL

## 2016-01-29 MED ORDER — LIDOCAINE HCL 1 % IJ SOLN
INTRAMUSCULAR | Status: AC
  Administered 2016-01-29: 8 mL
  Filled 2016-01-29: qty 20

## 2016-01-29 MED ORDER — SODIUM CHLORIDE 0.9 % IV SOLN
INTRAVENOUS | Status: AC | PRN
  Administered 2016-01-29: 10 mL/h via INTRAVENOUS

## 2016-01-29 MED ORDER — IOPAMIDOL (ISOVUE-300) INJECTION 61%
INTRAVENOUS | Status: AC
  Filled 2016-01-29: qty 100

## 2016-01-29 MED ORDER — FENTANYL CITRATE (PF) 100 MCG/2ML IJ SOLN
INTRAMUSCULAR | Status: AC | PRN
  Administered 2016-01-29 (×3): 25 ug via INTRAVENOUS
  Administered 2016-01-29 (×2): 12.5 ug via INTRAVENOUS

## 2016-01-29 MED ORDER — IODIXANOL 320 MG/ML IV SOLN
INTRAVENOUS | Status: AC | PRN
  Administered 2016-01-29: 11 mL via INTRA_ARTERIAL

## 2016-01-29 MED ORDER — MIDAZOLAM HCL 2 MG/2ML IJ SOLN
INTRAMUSCULAR | Status: AC
  Filled 2016-01-29: qty 2

## 2016-01-29 MED ORDER — FENTANYL CITRATE (PF) 100 MCG/2ML IJ SOLN
INTRAMUSCULAR | Status: AC
  Filled 2016-01-29: qty 2

## 2016-01-29 MED ORDER — NITROGLYCERIN 1 MG/10 ML FOR IR/CATH LAB
INTRA_ARTERIAL | Status: AC
  Administered 2016-01-29: 200 ug
  Filled 2016-01-29: qty 10

## 2016-01-29 MED ORDER — SODIUM CHLORIDE 0.9 % IV SOLN
INTRAVENOUS | Status: DC
  Administered 2016-01-29: 09:00:00 via INTRAVENOUS

## 2016-01-29 MED ORDER — MIDAZOLAM HCL 2 MG/2ML IJ SOLN
INTRAMUSCULAR | Status: AC | PRN
  Administered 2016-01-29 (×3): 0.5 mg via INTRAVENOUS
  Administered 2016-01-29: 1 mg via INTRAVENOUS
  Administered 2016-01-29: 0.5 mg via INTRAVENOUS

## 2016-01-29 MED ORDER — MIDAZOLAM HCL 2 MG/2ML IJ SOLN
INTRAMUSCULAR | Status: AC
Start: 2016-01-29 — End: 2016-01-29
  Filled 2016-01-29: qty 2

## 2016-01-29 NOTE — Sedation Documentation (Signed)
Transferred to short stay room 3 R groin site clean dry intact no hematoma, R PIV dislodged on transfer to bed. Dressing applied.

## 2016-01-29 NOTE — Sedation Documentation (Signed)
Review groin precautions with pt 

## 2016-01-29 NOTE — H&P (Signed)
Chief Complaint: LUE pain likely secondary to scleroderma  Referring Physician:Dr. [name]  Supervising Physician: [name]  Patient Status: [facility] - Out-pt  HPI: Lisa Long is an 50 y.o. female with a history of scleroderma who underwent an angiogram of her RUE last year secondary to pain in her right hand from her scleroderma and Raynaud's syndrome.   She underwent a parasympathectomy with a vein graft to her thrombosed ulnar artery.  She is having similar symptoms in her LUE now and a request has been made for an angio of her LUE to confirm this diagnosis.  She is having an achiness in her left hand.  Otherwise, no other complaints currently.  Past Medical History:  Past Medical History:  Diagnosis Date  . Cervical spondylosis without myelopathy   . Esophageal stricture - GERD 07/23/2014  . Folate deficiency anemia   . GERD (gastroesophageal reflux disease)   . Hx of adenomatous polyp of colon 10/14/2014  . Hypertension   . IBS (irritable bowel syndrome)   . Raynaud's disease, idiopathic   . Seasonal allergies   . Tobacco use   . Vitamin B12 deficiency     Past Surgical History:  Past Surgical History:  Procedure Laterality Date  . ABDOMINAL HYSTERECTOMY    . BREAST SURGERY     benign breast cysts  . CERVICAL FUSION    . COLONOSCOPY    . DERMOID CYST  EXCISION    . 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 HEMORRHOID SURGERY    . NAILBED REPAIR Right 01/30/2013   Procedure: BIOPSY OF RIGHT LONG NAIL FOLD;  Surgeon: [name]., MD;  Location: [facility];  Service: Orthopedics;  Laterality: Right;  Penrose used as tourniquet--on at 0944, off at 1001.  . OOPHORECTOMY    . SYMPATHECTOMY Right 01/30/2015   Procedure: DIGITAL SYMPATHECTOMY, RIGHT RADIAL ARTERY, ULNAR ARTERY, ARCH DIGITAL VESSELS INDEX MIDDLE RING SMALL GRAFT ULNAR ARTERY ;  Surgeon: [name], MD;  Location: [facility];  Service: Orthopedics;  Laterality:  Right;    Family History:  Family History  Problem Relation Age of Onset  . ALS Mother   . Diabetes Other   . Hypertension Other   . Stroke Other     F 1st degree relative <60    Social History:  reports that she has been smoking Cigarettes.  She has a 12.50 pack-year smoking history. She has never used smokeless tobacco. She reports that she drinks about 12.0 oz of alcohol per week . She reports that she does not use drugs.  Allergies: No Known Allergies  Medications: Medications reviewed in Epic  Please HPI for pertinent positives, otherwise complete 10 system ROS negative.  Mallampati Score: MD Evaluation Airway: WNL Heart: WNL Abdomen: WNL Chest/ Lungs: WNL ASA  Classification: 2 Mallampati/Airway Score: Two  Physical Exam: BP 120/82   Pulse 96   Temp 98.7 F (37.1 C)   Resp 20   Ht 5\' 8"  (1.727 m)   Wt 119 lb (54 kg)   LMP 01/28/2010 (Approximate)   SpO2 99%   BMI 18.09 kg/m  Body mass index is 18.09 kg/m. General: pleasant, WD, WN white female who is laying in bed in NAD HEENT: head is normocephalic, atraumatic.  Sclera are noninjected.  PERRL.  Ears and nose without any masses or lesions.  Mouth is pink and moist Heart: regular, rate, and rhythm.  Normal s1,s2. No obvious murmurs,  gallops, or rubs noted.  Palpable radial and pedal pulses bilaterally Lungs: CTAB, no wheezes, rhonchi, or rales noted.  Respiratory effort nonlabored Abd: soft, NT, ND, +BS, no masses, hernias, or organomegaly MS: all 4 extremities are symmetrical with no cyanosis, clubbing, or edema.  Some thickening of her skin on the tips of her fingers on her right hand, but left hand with a glove on. Psych: A&Ox3 with an appropriate affect.   Labs: Pending   Imaging: No results found.  Assessment/Plan 1. LUE pain, history of scleroderma -we will proceed today with an angiogram of her LUE to determine if there is a source for her discomfort. -vitals reviewed, labs are pending -Risks  and Benefits discussed with the patient including, but not limited to bleeding, infection, vascular injury or contrast induced renal failure. All of the patient's questions were answered, patient is agreeable to proceed. Consent signed and in chart.   Thank you for this interesting consult.  I greatly enjoyed meeting Heath Caverly and look forward to participating in their care.  A copy of this report was sent to the requesting provider on this date.  Electronically Signed: Henreitta Cea 01/29/2016, 8:57 AM   I spent a total of  30 Minutes   in face to face in clinical consultation, greater than 50% of which was counseling/coordinating care for LUE pain, history of scleroderma

## 2016-01-29 NOTE — Discharge Instructions (Signed)
Angiogram, Care After °Refer to this sheet in the next few weeks. These instructions provide you with information about caring for yourself after your procedure. Your health care provider may also give you more specific instructions. Your treatment has been planned according to current medical practices, but problems sometimes occur. Call your health care provider if you have any problems or questions after your procedure. °WHAT TO EXPECT AFTER THE PROCEDURE °After your procedure, it is typical to have the following: °· Bruising at the catheter insertion site that usually fades within 1-2 weeks. °· Blood collecting in the tissue (hematoma) that may be painful to the touch. It should usually decrease in size and tenderness within 1-2 weeks. °HOME CARE INSTRUCTIONS °· Take medicines only as directed by your health care provider. °· You may shower 24-48 hours after the procedure or as directed by your health care provider. Remove the bandage (dressing) and gently wash the site with plain soap and water. Pat the area dry with a clean towel. Do not rub the site, because this may cause bleeding. °· Do not take baths, swim, or use a hot tub until your health care provider approves. °· Check your insertion site every day for redness, swelling, or drainage. °· Do not apply powder or lotion to the site. °· Do not lift over 10 lb (4.5 kg) for 5 days after your procedure or as directed by your health care provider. °· Ask your health care provider when it is okay to: °¨ Return to work or school. °¨ Resume usual physical activities or sports. °¨ Resume sexual activity. °· Do not drive home if you are discharged the same day as the procedure. Have someone else drive you. °· You may drive 24 hours after the procedure unless otherwise instructed by your health care provider. °· Do not operate machinery or power tools for 24 hours after the procedure or as directed by your health care provider. °· If your procedure was done as an  outpatient procedure, which means that you went home the same day as your procedure, a responsible adult should be with you for the first 24 hours after you arrive home. °· Keep all follow-up visits as directed by your health care provider. This is important. °SEEK MEDICAL CARE IF: °· You have a fever. °· You have chills. °· You have increased bleeding from the catheter insertion site. Hold pressure on the site.  CALL 911 °SEEK IMMEDIATE MEDICAL CARE IF: °· You have unusual pain at the catheter insertion site. °· You have redness, warmth, or swelling at the catheter insertion site. °· You have drainage (other than a small amount of blood on the dressing) from the catheter insertion site. °· The catheter insertion site is bleeding, and the bleeding does not stop after 30 minutes of holding steady pressure on the site. °· The area near or just beyond the catheter insertion site becomes pale, cool, tingly, or numb. °  °This information is not intended to replace advice given to you by your health care provider. Make sure you discuss any questions you have with your health care provider. °  °Document Released: 10/08/2004 Document Revised: 04/12/2014 Document Reviewed: 08/23/2012 °Elsevier Interactive Patient Education ©2016 Elsevier Inc[employer] ° °

## 2016-01-29 NOTE — Procedures (Signed)
Post-Procedure Note  Pre-operative Diagnosis: Scleroderma and left hand pain.        Post-operative Diagnosis: Abnormal digital artery flow.   Indications:  Left hand pain.  Procedure Details:   Consent: Informed consent was obtained.  Left upper extremity angiogram performed.  Exoseal closure device used.    Findings: Early take off of radial artery.  Abnormal flow to index finger.  Complications: None     Condition: Good  Plan: Bedrest 3 hours.  Then discharge to home.

## 2016-02-02 DIAGNOSIS — M349 Systemic sclerosis, unspecified: Secondary | ICD-10-CM | POA: Diagnosis not present

## 2016-02-02 DIAGNOSIS — I96 Gangrene, not elsewhere classified: Secondary | ICD-10-CM | POA: Diagnosis not present

## 2016-02-03 ENCOUNTER — Other Ambulatory Visit: Payer: Self-pay | Admitting: Orthopedic Surgery

## 2016-02-04 MED FILL — HYDROCODON-APAP 5-325: 5-325 | 20 days supply | Qty: 60 | Fill #0

## 2016-02-06 ENCOUNTER — Telehealth (HOSPITAL_COMMUNITY): Payer: Self-pay | Admitting: Cardiology

## 2016-02-06 NOTE — Telephone Encounter (Signed)
Patient called to request echo  Results Echo done 01/28/16, patient aware of results

## 2016-03-01 ENCOUNTER — Encounter (HOSPITAL_BASED_OUTPATIENT_CLINIC_OR_DEPARTMENT_OTHER): Payer: Self-pay | Admitting: *Deleted

## 2016-03-01 NOTE — Progress Notes (Signed)
Pt coming tomorrow for BMET and EKG.Bring all medications. 

## 2016-03-02 ENCOUNTER — Encounter (HOSPITAL_BASED_OUTPATIENT_CLINIC_OR_DEPARTMENT_OTHER)
Admission: RE | Admit: 2016-03-02 | Discharge: 2016-03-02 | Disposition: A | Discharge status: Home / Self Care | Payer: 59 | Source: Ambulatory Visit | Attending: Orthopedic Surgery | Admitting: Orthopedic Surgery

## 2016-03-02 DIAGNOSIS — I96 Gangrene, not elsewhere classified: Secondary | ICD-10-CM | POA: Diagnosis not present

## 2016-03-02 DIAGNOSIS — Z79899 Other long term (current) drug therapy: Secondary | ICD-10-CM | POA: Diagnosis not present

## 2016-03-02 DIAGNOSIS — I10 Essential (primary) hypertension: Secondary | ICD-10-CM | POA: Diagnosis not present

## 2016-03-02 DIAGNOSIS — Z87891 Personal history of nicotine dependence: Secondary | ICD-10-CM | POA: Diagnosis not present

## 2016-03-02 DIAGNOSIS — M341 CR(E)ST syndrome: Secondary | ICD-10-CM | POA: Diagnosis not present

## 2016-03-02 DIAGNOSIS — Z791 Long term (current) use of non-steroidal anti-inflammatories (NSAID): Secondary | ICD-10-CM | POA: Diagnosis not present

## 2016-03-02 LAB — BASIC METABOLIC PANEL
ANION GAP: 10 (ref 5–15)
BUN: 9 mg/dL (ref 6–20)
CALCIUM: 10 mg/dL (ref 8.9–10.3)
CHLORIDE: 98 mmol/L — AB (ref 101–111)
CO2: 28 mmol/L (ref 22–32)
Creatinine, Ser: 0.54 mg/dL (ref 0.44–1.00)
GFR calc non Af Amer: >60 mL/min (ref >60)
GLUCOSE: 101 mg/dL — AB (ref 65–99)
Potassium: 4 mmol/L (ref 3.5–5.1)
Sodium: 136 mmol/L (ref 135–145)

## 2016-03-04 ENCOUNTER — Ambulatory Visit (HOSPITAL_BASED_OUTPATIENT_CLINIC_OR_DEPARTMENT_OTHER): Payer: 59 | Admitting: Anesthesiology

## 2016-03-04 ENCOUNTER — Encounter (HOSPITAL_BASED_OUTPATIENT_CLINIC_OR_DEPARTMENT_OTHER): Payer: Self-pay | Admitting: Anesthesiology

## 2016-03-04 ENCOUNTER — Encounter (HOSPITAL_BASED_OUTPATIENT_CLINIC_OR_DEPARTMENT_OTHER)
Admission: RE | Disposition: A | Discharge status: Home / Self Care | Payer: Self-pay | Source: Ambulatory Visit | Attending: Orthopedic Surgery

## 2016-03-04 ENCOUNTER — Ambulatory Visit (HOSPITAL_BASED_OUTPATIENT_CLINIC_OR_DEPARTMENT_OTHER)
Admission: RE | Admit: 2016-03-04 | Discharge: 2016-03-04 | Disposition: A | Discharge status: Home / Self Care | Payer: 59 | Source: Ambulatory Visit | Attending: Orthopedic Surgery | Admitting: Orthopedic Surgery

## 2016-03-04 DIAGNOSIS — I96 Gangrene, not elsewhere classified: Secondary | ICD-10-CM | POA: Diagnosis not present

## 2016-03-04 DIAGNOSIS — G8918 Other acute postprocedural pain: Secondary | ICD-10-CM | POA: Diagnosis not present

## 2016-03-04 DIAGNOSIS — Z791 Long term (current) use of non-steroidal anti-inflammatories (NSAID): Secondary | ICD-10-CM | POA: Insufficient documentation

## 2016-03-04 DIAGNOSIS — I10 Essential (primary) hypertension: Secondary | ICD-10-CM | POA: Insufficient documentation

## 2016-03-04 DIAGNOSIS — Z79899 Other long term (current) drug therapy: Secondary | ICD-10-CM | POA: Insufficient documentation

## 2016-03-04 DIAGNOSIS — M341 CR(E)ST syndrome: Secondary | ICD-10-CM | POA: Insufficient documentation

## 2016-03-04 DIAGNOSIS — Z87891 Personal history of nicotine dependence: Secondary | ICD-10-CM | POA: Diagnosis not present

## 2016-03-04 DIAGNOSIS — M3489 Other systemic sclerosis: Secondary | ICD-10-CM | POA: Diagnosis not present

## 2016-03-04 DIAGNOSIS — K219 Gastro-esophageal reflux disease without esophagitis: Secondary | ICD-10-CM | POA: Diagnosis not present

## 2016-03-04 DIAGNOSIS — M349 Systemic sclerosis, unspecified: Secondary | ICD-10-CM | POA: Diagnosis not present

## 2016-03-04 HISTORY — PX: SYMPATHECTOMY: SHX792

## 2016-03-04 HISTORY — DX: Cr(e)st syndrome: M34.1

## 2016-03-04 HISTORY — DX: Spinal stenosis, site unspecified: M48.00

## 2016-03-04 SURGERY — SYMPATHECTOMY
Anesthesia: General | Site: Wrist | Laterality: Left

## 2016-03-04 MED ORDER — EPHEDRINE SULFATE 50 MG/ML IJ SOLN
INTRAMUSCULAR | Status: DC | PRN
  Administered 2016-03-04: 25 mg via INTRAVENOUS

## 2016-03-04 MED ORDER — BUPIVACAINE HCL (PF) 0.5 % IJ SOLN
INTRAMUSCULAR | Status: DC | PRN
  Administered 2016-03-04: 25 mL via PERINEURAL

## 2016-03-04 MED ORDER — DEXAMETHASONE SODIUM PHOSPHATE 10 MG/ML IJ SOLN
INTRAMUSCULAR | Status: AC
  Filled 2016-03-04: qty 1

## 2016-03-04 MED ORDER — OXYCODONE-ACETAMINOPHEN 7.5-325 MG PO TABS: 1.0000 | ORAL_TABLET | ORAL | 0 refills | Status: DC | PRN

## 2016-03-04 MED ORDER — CHLORHEXIDINE GLUCONATE 4 % EX LIQD: 60.0000 mL | Freq: Once | CUTANEOUS | Status: DC

## 2016-03-04 MED ORDER — OXYCODONE HCL 5 MG PO TABS: 5.0000 mg | ORAL_TABLET | Freq: Once | ORAL | Status: DC | PRN

## 2016-03-04 MED ORDER — FENTANYL CITRATE (PF) 100 MCG/2ML IJ SOLN
INTRAMUSCULAR | Status: AC
  Filled 2016-03-04: qty 2

## 2016-03-04 MED ORDER — PHENYLEPHRINE 40 MCG/ML (10ML) SYRINGE FOR IV PUSH (FOR BLOOD PRESSURE SUPPORT)
PREFILLED_SYRINGE | INTRAVENOUS | Status: AC
  Filled 2016-03-04: qty 10

## 2016-03-04 MED ORDER — FENTANYL CITRATE (PF) 100 MCG/2ML IJ SOLN
INTRAMUSCULAR | Status: DC | PRN
  Administered 2016-03-04 (×2): 25 ug via INTRAVENOUS

## 2016-03-04 MED ORDER — FENTANYL CITRATE (PF) 100 MCG/2ML IJ SOLN: 25.0000 ug | INTRAMUSCULAR | Status: DC | PRN

## 2016-03-04 MED ORDER — FENTANYL CITRATE (PF) 100 MCG/2ML IJ SOLN
50.0000 ug | INTRAMUSCULAR | Status: DC | PRN
  Administered 2016-03-04: 50 ug via INTRAVENOUS

## 2016-03-04 MED ORDER — PHENYLEPHRINE HCL 10 MG/ML IJ SOLN
INTRAMUSCULAR | Status: DC | PRN
  Administered 2016-03-04: 80 ug via INTRAVENOUS

## 2016-03-04 MED ORDER — LIDOCAINE HCL (CARDIAC) 20 MG/ML IV SOLN
INTRAVENOUS | Status: DC | PRN
  Administered 2016-03-04: 30 mg via INTRAVENOUS

## 2016-03-04 MED ORDER — LIDOCAINE-EPINEPHRINE (PF) 1.5 %-1:200000 IJ SOLN
INTRAMUSCULAR | Status: DC | PRN
  Administered 2016-03-04: 15 mL via PERINEURAL

## 2016-03-04 MED ORDER — LIDOCAINE 2% (20 MG/ML) 5 ML SYRINGE
INTRAMUSCULAR | Status: AC
  Filled 2016-03-04: qty 5

## 2016-03-04 MED ORDER — MIDAZOLAM HCL 2 MG/2ML IJ SOLN
INTRAMUSCULAR | Status: AC
  Filled 2016-03-04: qty 2

## 2016-03-04 MED ORDER — CEFAZOLIN SODIUM-DEXTROSE 2-4 GM/100ML-% IV SOLN
INTRAVENOUS | Status: AC
  Filled 2016-03-04: qty 100

## 2016-03-04 MED ORDER — PROPOFOL 10 MG/ML IV BOLUS
INTRAVENOUS | Status: DC | PRN
  Administered 2016-03-04: 100 mg via INTRAVENOUS
  Administered 2016-03-04: 200 mg via INTRAVENOUS

## 2016-03-04 MED ORDER — KETOROLAC TROMETHAMINE 30 MG/ML IJ SOLN: 30.0000 mg | Freq: Once | INTRAMUSCULAR | Status: DC | PRN

## 2016-03-04 MED ORDER — LACTATED RINGERS IV SOLN
INTRAVENOUS | Status: DC
  Administered 2016-03-04 (×2): via INTRAVENOUS

## 2016-03-04 MED ORDER — ONDANSETRON HCL 4 MG/2ML IJ SOLN
INTRAMUSCULAR | Status: DC | PRN
  Administered 2016-03-04: 4 mg via INTRAVENOUS

## 2016-03-04 MED ORDER — CEFAZOLIN SODIUM-DEXTROSE 2-4 GM/100ML-% IV SOLN
2.0000 g | INTRAVENOUS | Status: AC
  Administered 2016-03-04: 2 g via INTRAVENOUS

## 2016-03-04 MED ORDER — DEXAMETHASONE SODIUM PHOSPHATE 10 MG/ML IJ SOLN
INTRAMUSCULAR | Status: DC | PRN
  Administered 2016-03-04: 10 mg via INTRAVENOUS

## 2016-03-04 MED ORDER — MIDAZOLAM HCL 5 MG/5ML IJ SOLN
INTRAMUSCULAR | Status: DC | PRN
  Administered 2016-03-04: 2 mg via INTRAVENOUS

## 2016-03-04 MED ORDER — PROPOFOL 10 MG/ML IV BOLUS
INTRAVENOUS | Status: AC
  Filled 2016-03-04: qty 20

## 2016-03-04 MED ORDER — ONDANSETRON HCL 4 MG/2ML IJ SOLN
INTRAMUSCULAR | Status: AC
  Filled 2016-03-04: qty 2

## 2016-03-04 MED ORDER — PROMETHAZINE HCL 25 MG/ML IJ SOLN: 6.2500 mg | INTRAMUSCULAR | Status: DC | PRN

## 2016-03-04 MED ORDER — MIDAZOLAM HCL 2 MG/2ML IJ SOLN
1.0000 mg | INTRAMUSCULAR | Status: DC | PRN
  Administered 2016-03-04: 2 mg via INTRAVENOUS

## 2016-03-04 MED ORDER — SCOPOLAMINE 1 MG/3DAYS TD PT72
1.0000 | MEDICATED_PATCH | Freq: Once | TRANSDERMAL | Status: DC | PRN
Start: 2016-03-04 — End: 2016-03-04

## 2016-03-04 MED ORDER — OXYCODONE HCL 5 MG/5ML PO SOLN: 5.0000 mg | Freq: Once | ORAL | Status: DC | PRN

## 2016-03-04 SURGICAL SUPPLY — 50 items
BAG DECANTER FOR FLEXI CONT (MISCELLANEOUS) IMPLANT
BLADE MINI RND TIP GREEN BEAV (BLADE) IMPLANT
BLADE SURG 15 STRL LF DISP TIS (BLADE) ×1 IMPLANT
BLADE SURG 15 STRL SS (BLADE) ×1
BNDG COHESIVE 3X5 TAN STRL LF (GAUZE/BANDAGES/DRESSINGS) ×2 IMPLANT
BNDG ESMARK 4X9 LF (GAUZE/BANDAGES/DRESSINGS) IMPLANT
BNDG GAUZE ELAST 4 BULKY (GAUZE/BANDAGES/DRESSINGS) ×2 IMPLANT
CHLORAPREP W/TINT 26ML (MISCELLANEOUS) ×2 IMPLANT
CORDS BIPOLAR (ELECTRODE) ×2 IMPLANT
COVER BACK TABLE 60X90IN (DRAPES) ×2 IMPLANT
COVER MAYO STAND STRL (DRAPES) ×2 IMPLANT
CUFF TOURNIQUET SINGLE 18IN (TOURNIQUET CUFF) IMPLANT
DECANTER SPIKE VIAL GLASS SM (MISCELLANEOUS) ×2 IMPLANT
DRAPE EXTREMITY T 121X128X90 (DRAPE) ×2 IMPLANT
DRAPE SURG 17X23 STRL (DRAPES) ×2 IMPLANT
GAUZE SPONGE 4X4 12PLY STRL (GAUZE/BANDAGES/DRESSINGS) ×2 IMPLANT
GAUZE XEROFORM 1X8 LF (GAUZE/BANDAGES/DRESSINGS) ×2 IMPLANT
GLOVE BIOGEL PI IND STRL 8.5 (GLOVE) ×1 IMPLANT
GLOVE BIOGEL PI INDICATOR 8.5 (GLOVE) ×1
GLOVE SURG ORTHO 8.0 STRL STRW (GLOVE) ×2 IMPLANT
GOWN STRL REUS W/ TWL LRG LVL3 (GOWN DISPOSABLE) ×1 IMPLANT
GOWN STRL REUS W/TWL LRG LVL3 (GOWN DISPOSABLE) ×1
GOWN STRL REUS W/TWL XL LVL3 (GOWN DISPOSABLE) ×2 IMPLANT
LOOP VESSEL MAXI BLUE (MISCELLANEOUS) IMPLANT
NDL SAFETY ECLIPSE 18X1.5 (NEEDLE) IMPLANT
NEEDLE HYPO 18GX1.5 SHARP (NEEDLE)
NEEDLE PRECISIONGLIDE 27X1.5 (NEEDLE) IMPLANT
NS IRRIG 1000ML POUR BTL (IV SOLUTION) ×2 IMPLANT
PACK BASIN DAY SURGERY FS (CUSTOM PROCEDURE TRAY) ×2 IMPLANT
PAD CAST 3X4 CTTN HI CHSV (CAST SUPPLIES) ×1 IMPLANT
PAD CAST 4YDX4 CTTN HI CHSV (CAST SUPPLIES) IMPLANT
PADDING CAST ABS 4INX4YD NS (CAST SUPPLIES) ×1
PADDING CAST ABS COTTON 4X4 ST (CAST SUPPLIES) ×1 IMPLANT
PADDING CAST COTTON 3X4 STRL (CAST SUPPLIES) ×1
PADDING CAST COTTON 4X4 STRL (CAST SUPPLIES)
SLEEVE SCD COMPRESS KNEE MED (MISCELLANEOUS) IMPLANT
SPEAR EYE SURG WECK-CEL (MISCELLANEOUS) ×2 IMPLANT
SPLINT PLASTER CAST XFAST 3X15 (CAST SUPPLIES) IMPLANT
SPLINT PLASTER XTRA FASTSET 3X (CAST SUPPLIES)
STOCKINETTE 4X48 STRL (DRAPES) ×2 IMPLANT
SUT ETHIBOND 3-0 V-5 (SUTURE) IMPLANT
SUT ETHILON 4 0 PS 2 18 (SUTURE) ×2 IMPLANT
SUT MERSILENE 6 0 P 1 (SUTURE) IMPLANT
SUT NYLON 9 0 VRM6 (SUTURE) IMPLANT
SUT SILK 4 0 PS 2 (SUTURE) IMPLANT
SUT VICRYL 4-0 PS2 18IN ABS (SUTURE) IMPLANT
SYR BULB 3OZ (MISCELLANEOUS) ×2 IMPLANT
SYR CONTROL 10ML LL (SYRINGE) IMPLANT
TOWEL OR 17X24 6PK STRL BLUE (TOWEL DISPOSABLE) ×4 IMPLANT
UNDERPAD 30X30 (UNDERPADS AND DIAPERS) ×2 IMPLANT

## 2016-03-04 NOTE — H&P (Signed)
Lisa Long is an 50 y.o. female.   Chief Complaint: gangrene index finger left hand HPI: Lisa Long is  A 50yo female with scleroderma who has developed a  left hand ulcer on her index finger. This began over the past 6 months. She is seen by Dr. Hurman Horn at [facility]. She has scleroderma, With crest. She has undergone digital sympathectomies on her right side with good result. She has a VAS score of 9/10. She was seen at the urgent care center yesterday where she was placed on hydrocodone and given a injection of Rocephin and onto Septra. She complains of a sharp aching burning type pain to the index finger. She is on Norvasc Prozac. Had arteriogram on her right side prior to her sympathectomies on the right. She has not had an arteriogram on the left side.She was  sent for arteriogram of her left upper extremity.Her arteriogram is reviewed this was done and read by Dr. Anselm Pancoast revealing no flow to the radial digital artery and cut off of the ulnar digital artery of her index finger. She has intact radial and ulnar arteries arch, digitals. These are reviewed with her and her husband.              Past Medical History:  Diagnosis Date  . Cervical spondylosis without myelopathy   . CREST (calcinosis, Raynaud's phenomenon, esophageal dysfunction, sclerodactyly, telangiectasia) ([place])   . Esophageal stricture - GERD 07/23/2014  . GERD (gastroesophageal reflux disease)   . Hx of adenomatous polyp of colon 10/14/2014  . Hypertension   . IBS (irritable bowel syndrome)   . Raynaud's disease, idiopathic   . Seasonal allergies   . Spinal stenosis   . Tobacco use   . Vitamin B12 deficiency     Past Surgical History:  Procedure Laterality Date  . BREAST SURGERY     benign breast cysts  . CERVICAL FUSION    . COLONOSCOPY    . DERMOID CYST  EXCISION    . 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 HEMORRHOID SURGERY    . IR GENERIC HISTORICAL  01/29/2016   IR ANGIOGRAM EXTREMITY LEFT 01/29/2016  [name], MD [facility]-INTERV RAD  . IR GENERIC HISTORICAL  01/29/2016   IR US GUIDE VASC ACCESS RIGHT 01/29/2016 [name], MD [facility]-INTERV RAD  . NAILBED REPAIR Right 01/30/2013   Procedure: BIOPSY OF RIGHT LONG NAIL FOLD;  Surgeon: [name]., MD;  Location: [facility];  Service: Orthopedics;  Laterality: Right;  Penrose used as tourniquet--on at 0944, off at 1001.  . OOPHORECTOMY     Right ovary removal only  . SYMPATHECTOMY Right 01/30/2015   Procedure: DIGITAL SYMPATHECTOMY, RIGHT RADIAL ARTERY, ULNAR ARTERY, ARCH DIGITAL VESSELS INDEX MIDDLE RING SMALL GRAFT ULNAR ARTERY ;  Surgeon: [name], MD;  Location: [place];  Service: Orthopedics;  Laterality: Right;    Family History  Problem Relation Age of Onset  . ALS Mother   . Diabetes Other   . Hypertension Other   . Stroke Other     F 1st degree relative <60   Social History:  reports that she quit smoking about 15 months ago. Her smoking use included Cigarettes. She has a 12.50 pack-year smoking history. She has never used smokeless tobacco. She reports that she drinks about 12.0 oz of alcohol per week . She reports that she does not use drugs.  Allergies: No Known Allergies  No prescriptions prior to admission.  Results for orders placed or performed during the hospital encounter of 03/04/16 (from the past 48 hour(s))  Basic metabolic panel     Status: Abnormal   Collection Time: 03/02/16 11:30 AM  Result Value Ref Range   Sodium 136 135 - 145 mmol/L   Potassium 4.0 3.5 - 5.1 mmol/L   Chloride 98 (L) 101 - 111 mmol/L   CO2 28 22 - 32 mmol/L   Glucose, Bld 101 (H) 65 - 99 mg/dL   BUN 9 6 - 20 mg/dL   Creatinine, Ser 0.54 0.44 - 1.00 mg/dL   Calcium 10.0 8.9 - 10.3 mg/dL   GFR calc non Af Amer >60 >60 mL/min   GFR calc Af Amer >60 >60 mL/min    Comment: (NOTE) The eGFR has been calculated using the CKD EPI equation. This calculation has not been validated in all clinical  situations. eGFR's persistently <60 mL/min signify possible Chronic Kidney Disease.    Anion gap 10 5 - 15    No results found.   Pertinent items are noted in HPI.  Height _0  (1.727 m), weight 55.3 kg (122 lb), last menstrual period 01/28/2010.  General appearance: alert, cooperative and appears stated age Head: Normocephalic, without obvious abnormality Neck: no JVD Resp: clear to auscultation bilaterally Cardio: regular rate and rhythm, S1, S2 normal, no murmur, click, rub or gallop GI: soft, non-tender; bowel sounds normal; no masses,  no organomegaly Extremities: gangrene index left Pulses: 2+ and symmetric Skin: Skin color, texture, turgor normal. No rashes or lesions Neurologic: Grossly normal Incision/Wound:  gangrene index finger  Assessment/Plan Assessment:  1. Generalized scleroderma  2. Gangrene of finger    Plan: We will schedule her for digital sympathectomies of her radial and ulnar artery arch, digitals left hand. Postoperative course are discussed along with risks and complications are well aware of potential hazards of the surgery. She has had her right side done in the past by myself. Lisa Long is an outpatient under regional general block.      Mitcheal Sweetin R 03/04/2016, 11:27 AM

## 2016-03-04 NOTE — Op Note (Signed)
Other Dictation: Dictation Number [id number]

## 2016-03-04 NOTE — Op Note (Signed)
I assisted Surgeon(s) and Role:    * [name], MD - Primary    * Leanora Cover, MD - Assisting on the Procedure(s): Digital SYMPATHECTOMY, left radial, ulnar, arch and common digital times 3 on 03/04/2016.  I provided assistance on this case as follows: retraction of soft tissues, sympathectomy of arteries, closure of wounds.  Electronically signed by: Tennis Must, MD Date: 03/04/2016 Time: 4:07 PM

## 2016-03-04 NOTE — Anesthesia Procedure Notes (Signed)
Procedure Name: LMA Insertion Date/Time: 03/04/2016 2:56 PM Performed by: Toula Moos L Pre-anesthesia Checklist: Patient identified, Emergency Drugs available, Suction available, Patient being monitored and Timeout performed Patient Re-evaluated:Patient Re-evaluated prior to inductionOxygen Delivery Method: Circle system utilized Preoxygenation: Pre-oxygenation with 100% oxygen Intubation Type: IV induction Ventilation: Mask ventilation without difficulty LMA: LMA inserted LMA Size: 4.0 Number of attempts: 1 Airway Equipment and Method: Bite block Placement Confirmation: positive ETCO2 Tube secured with: Tape Dental Injury: Teeth and Oropharynx as per pre-operative assessment

## 2016-03-04 NOTE — Transfer of Care (Signed)
Immediate Anesthesia Transfer of Care Note  Patient: Lisa Long  Procedure(s) Performed: Procedure(s) with comments: Digital SYMPATHECTOMY, left radial, ulnar, arch and common digital times 3 (Left) - Digital SYMPATHECTOMY, left radial, ulnar, arch and common digital times 3  Patient Location: PACU  Anesthesia Type:GA combined with regional for post-op pain  Level of Consciousness: sedated  Airway & Oxygen Therapy: Patient Spontanous Breathing and Patient connected to face mask oxygen  Post-op Assessment: Report given to RN and Post -op Vital signs reviewed and stable  Post vital signs: Reviewed and stable  Last Vitals:  Vitals:   03/04/16 1345 03/04/16 1350  BP: 104/78   Pulse: 89 96  Resp: 10 17  Temp:      Last Pain:  Vitals:   03/04/16 1250  TempSrc: Oral  PainSc: 7       Patients Stated Pain Goal: 2 ([id number])  Complications: No apparent anesthesia complications

## 2016-03-04 NOTE — Discharge Instructions (Addendum)
°Post Anesthesia Home Care Instructions ° °Activity: °Get plenty of rest for the remainder of the day. A responsible adult should stay with you for 24 hours following the procedure.  °For the next 24 hours, DO NOT: °-Drive a car °-Operate machinery °-Drink alcoholic beverages °-Take any medication unless instructed by your physician °-Make any legal decisions or sign important papers. ° °Meals: °Start with liquid foods such as gelatin or soup. Progress to regular foods as tolerated. Avoid greasy, spicy, heavy foods. If nausea and/or vomiting occur, drink only clear liquids until the nausea and/or vomiting subsides. Call your physician if vomiting continues. ° °Special Instructions/Symptoms: °Your throat may feel dry or sore from the anesthesia or the breathing tube placed in your throat during surgery. If this causes discomfort, gargle with warm salt water. The discomfort should disappear within 24 hours. ° °If you had a scopolamine patch placed behind your ear for the management of post- operative nausea and/or vomiting: ° °1. The medication in the patch is effective for 72 hours, after which it should be removed.  Wrap patch in a tissue and discard in the trash. Wash hands thoroughly with soap and water. °2. You may remove the patch earlier than 72 hours if you experience unpleasant side effects which may include dry mouth, dizziness or visual disturbances. °3. Avoid touching the patch. Wash your hands with soap and water after contact with the patch. °  °Regional Anesthesia Blocks ° °1. Numbness or the inability to move the "blocked" extremity may last from 3-48 hours after placement. The length of time depends on the medication injected and your individual response to the medication. If the numbness is not going away after 48 hours, call your surgeon. ° °2. The extremity that is blocked will need to be protected until the numbness is gone and the  Strength has returned. Because you cannot feel it, you will need  to take extra care to avoid injury. Because it may be weak, you may have difficulty moving it or using it. You may not know what position it is in without looking at it while the block is in effect. ° °3. For blocks in the legs and feet, returning to weight bearing and walking needs to be done carefully. You will need to wait until the numbness is entirely gone and the strength has returned. You should be able to move your leg and foot normally before you try and bear weight or walk. You will need someone to be with you when you first try to ensure you do not fall and possibly risk injury. ° °4. Bruising and tenderness at the needle site are common side effects and will resolve in a few days. ° °5. Persistent numbness or new problems with movement should be communicated to the surgeon or the [facility] ([phone])/ [facility] ([phone]). ° ° °Hand Center Instructions °Hand Surgery ° °Wound Care: °Keep your hand elevated above the level of your heart.  Do not allow it to dangle by your side.  Keep the dressing dry and do not remove it unless your doctor advises you to do so.  He will usually change it at the time of your post-op visit.  Moving your fingers is advised to stimulate circulation but will depend on the site of your surgery.  If you have a splint applied, your doctor will advise you regarding movement. ° °Activity: °Do not drive or operate machinery today.  Rest today and then you may return   then you may return to your normal activity and work as indicated by your physician.  Diet:  Drink liquids today or eat a light diet.  You may resume a regular diet tomorrow.    General expectations: Pain for two to three days. Fingers may become slightly swollen.  Call your doctor if any of the following occur: Severe pain not relieved by pain medication. Elevated temperature. Dressing soaked with blood. Inability to move fingers. White or bluish color to fingers.  

## 2016-03-04 NOTE — Anesthesia Postprocedure Evaluation (Deleted)
Anesthesia Post Note  Patient: Lisa Long  Procedure(s) Performed: Procedure(s) (LRB): Digital SYMPATHECTOMY, left radial, ulnar, arch and common digital times 3 (Left)  Patient location during evaluation: PACU Anesthesia Type: General Level of consciousness: awake and alert, patient cooperative and oriented Pain management: pain level controlled Vital Signs Assessment: post-procedure vital signs reviewed and stable Respiratory status: spontaneous breathing, nonlabored ventilation and respiratory function stable Cardiovascular status: blood pressure returned to baseline and stable Postop Assessment: no signs of nausea or vomiting Anesthetic complications: no    Last Vitals:  Vitals:   03/04/16 1645 03/04/16 1715  BP: (!) 134/94 132/86  Pulse: (!) 103 95  Resp: 16 16  Temp:  36.6 C    Last Pain:  Vitals:   03/04/16 1715  TempSrc: Oral  PainSc:                  Lisa Long,E. Lorain Keast

## 2016-03-04 NOTE — Anesthesia Preprocedure Evaluation (Signed)
Anesthesia Evaluation  Patient identified by MRN, date of birth, ID band Patient awake  General Assessment Comment:CREST (calcinosis, Raynaud's phenomenon, esophageal dysfunction, sclerodactyly, telangiectasia) ([place])  Reviewed: Allergy & Precautions, NPO status , Patient's Chart, lab work & pertinent test results  Airway Mallampati: II  TM Distance: >3 FB Neck ROM: Full    Dental no notable dental hx.    Pulmonary neg pulmonary ROS, former smoker,    Pulmonary exam normal breath sounds clear to auscultation       Cardiovascular hypertension, Normal cardiovascular exam Rhythm:Regular Rate:Normal     Neuro/Psych negative neurological ROS  negative psych ROS   GI/Hepatic negative GI ROS, Neg liver ROS,   Endo/Other  negative endocrine ROS  Renal/GU negative Renal ROS  negative genitourinary   Musculoskeletal negative musculoskeletal ROS (+)   Abdominal   Peds negative pediatric ROS (+)  Hematology negative hematology ROS (+)   Anesthesia Other Findings   Reproductive/Obstetrics negative OB ROS                             Anesthesia Physical Anesthesia Plan  ASA: II  Anesthesia Plan: General   Post-op Pain Management: GA combined w/ Regional for post-op pain   Induction: Intravenous  Airway Management Planned: LMA  Additional Equipment:   Intra-op Plan:   Post-operative Plan:   Informed Consent: I have reviewed the patients History and Physical, chart, labs and discussed the procedure including the risks, benefits and alternatives for the proposed anesthesia with the patient or authorized representative who has indicated his/her understanding and acceptance.   Dental advisory given  Plan Discussed with: CRNA and Surgeon  Anesthesia Plan Comments:         Anesthesia Quick Evaluation

## 2016-03-04 NOTE — Brief Op Note (Signed)
03/04/2016  4:07 PM  PATIENT:  Lisa Long  50 y.o. female  PRE-OPERATIVE DIAGNOSIS:  scleroderma left hand  POST-OPERATIVE DIAGNOSIS:  scleroderma left hand  PROCEDURE:  Procedure(s) with comments: Digital SYMPATHECTOMY, left radial, ulnar, arch and common digital times 3 (Left) - Digital SYMPATHECTOMY, left radial, ulnar, arch and common digital times 3  SURGEON:  Surgeon(s) and Role:    * [name], MD - Primary    * Leanora Cover, MD - Assisting  PHYSICIAN ASSISTANT:   ASSISTANTS: K Wilna Pennie,MD   ANESTHESIA:   regional and general  EBL:  Total I/O In: 1500 [I.V.:1500] Out: 4 [Blood:4]  BLOOD ADMINISTERED:none  DRAINS: none   LOCAL MEDICATIONS USED:  NONE  SPECIMEN:  No Specimen  DISPOSITION OF SPECIMEN:  N/A  COUNTS:  YES  TOURNIQUET:   Total Tourniquet Time Documented: Upper Arm (Left) - 51 minutes Total: Upper Arm (Left) - 51 minutes   DICTATION: .Other Dictation: Dictation Number [id number]  PLAN OF CARE: Discharge to home after PACU  PATIENT DISPOSITION:  PACU - hemodynamically stable.

## 2016-03-04 NOTE — Anesthesia Procedure Notes (Signed)
Anesthesia Regional Block:  Supraclavicular block  Pre-Anesthetic Checklist: ,, timeout performed, Correct Patient, Correct Site, Correct Laterality, Correct Procedure, Correct Position, site marked, Risks and benefits discussed,  Surgical consent,  Pre-op evaluation,  At surgeon's request and post-op pain management  Laterality: Left  Prep: chloraprep       Needles:  Injection technique: Single-shot  Needle Type: Echogenic Needle     Needle Length: 9cm 9 cm Needle Gauge: 21 G    Additional Needles:  Procedures: ultrasound guided (picture in chart) Supraclavicular block Narrative:  Start time: 03/04/2016 1:45 PM End time: 03/04/2016 1:50 PM Injection made incrementally with aspirations every 5 mL.  Performed by: Personally  Anesthesiologist: Azaiah Licciardi  Additional Notes: Risks, benefits and alternative to block explained extensively.  Patient tolerated procedure well, without complications.

## 2016-03-04 NOTE — Progress Notes (Signed)
Assisted Dr. Rose with left, ultrasound guided, interscalene  block. Side rails up, monitors on throughout procedure. See vital signs in flow sheet. Tolerated Procedure well.  

## 2016-03-04 NOTE — Anesthesia Postprocedure Evaluation (Signed)
Anesthesia Post Note  Patient: Lisa Long  Procedure(s) Performed: Procedure(s) (LRB): Digital SYMPATHECTOMY, left radial, ulnar, arch and common digital times 3 (Left)  Patient location during evaluation: PACU Anesthesia Type: General and Regional Level of consciousness: awake and alert, oriented and patient cooperative Pain management: pain level controlled Vital Signs Assessment: post-procedure vital signs reviewed and stable Respiratory status: spontaneous breathing, nonlabored ventilation and respiratory function stable Cardiovascular status: blood pressure returned to baseline and stable Postop Assessment: no signs of nausea or vomiting Anesthetic complications: no    Last Vitals:  Vitals:   03/04/16 1645 03/04/16 1715  BP: (!) 134/94 132/86  Pulse: (!) 103 95  Resp: 16 16  Temp:  36.6 C    Last Pain:  Vitals:   03/04/16 1715  TempSrc: Oral  PainSc:                  Leola Fiore,E. Deval Mroczka

## 2016-03-05 ENCOUNTER — Encounter (HOSPITAL_BASED_OUTPATIENT_CLINIC_OR_DEPARTMENT_OTHER): Payer: Self-pay | Admitting: Orthopedic Surgery

## 2016-03-05 NOTE — Op Note (Addendum)
NAMEMarland Long  Lisa, Long NO.:  [id number]  MEDICAL RECORD NO.:  [id number]  LOCATION:                                 FACILITY:  PHYSICIAN:  [name], M.D.       DATE OF BIRTH:  06/21/1965  DATE OF PROCEDURE:  03/04/2016 DATE OF DISCHARGE:                              OPERATIVE REPORT   PREOPERATIVE DIAGNOSIS:  Gangrene with scleroderma, left index finger.  POSTOPERATIVE DIAGNOSIS:  Gangrene with scleroderma, left index finger.  OPERATION:  Sympathectomy periarterial of left radial artery, left ulnar artery, superficial palmar arch, common digital arteries to the index, middle, ring and small fingers.Debridement of index finger SURGEON:  [name], M.D.  ASSISTANT:  Leanora Cover, M.D.  ANESTHESIA:  General with supraclavicular block, in addition a debridement of the index finger was performed.  PLACE OF SURGERY:  [facility].  ANESTHESIOLOGIST:  Dr. Glennon Mac.  HISTORY:  The patient is a 50 year old female with a history of scleroderma.  She has undergone a digital sympathectomy of arteries on her right side in the past.  She has developed gangrene on the tip of her left index finger.  Arteriogram reveals the extent of arterial damage with virtually no radial digital artery to the index finger, a small digital artery to the ulnar aspect of the index finger.  She has elected to undergo periarterial sympathectomies of her left hand in an effort to improve circulation and promote healing of the index finger. Pre, peri, and postoperative course have been discussed along with risks and complications.  She is aware that there is no guarantee to the surgery, the possibility of infection; recurrence of injury to arteries, nerves, tendons; incomplete relief of symptoms and dystrophy.  In the preoperative area, the patient is seen, the extremity was marked by both the patient and surgeon, and antibiotic is given.  PROCEDURE IN DETAIL:  The patient was  brought to the operating room after a supraclavicular block was carried out in the preoperative area. She was prepped using ChloraPrep in a supine position with the left arm free.  General anesthetic was added to this.  The limb was exsanguinated after a time-out taken confirming the patient and procedure.  A tourniquet placed on the upper arm was inflated to 250 mmHg. Longitudinal incisions were made over the volar aspect of the radial and ulnar arteries.  These were carried down through subcutaneous tissue. The artery on the radial side was identified.  Retractors were placed. Using loupe magnification, a periarterial sympathectomy was performed for approximately 2.5-3 cm in length.  The ulnar artery was similarly identified on the ulnar aspect.  Again, retractors placed under loupe magnification.  The periarterial sympathectomy was performed removing the adventitia for a distance of approximately 3 cm on the ulnar artery. Both arteries looked reasonably good.  These were irrigated and the skin closed with interrupted 4-0 nylon sutures.  Separate incision was then made in the first webspace dorsally to identify the dorsal branch of the radial artery.  The dissection was carried down through subcutaneous tissue.  Bleeders were electrocauterized with bipolar.  The artery was identified, this was then relieved of the adventitia again for  approximately 2.5-3 cm.  This wound was irrigated and closed with interrupted 4-0 nylon sutures.  A transverse incision was made in the palm in the position of the superficial palmar arch.  This was carried down through subcutaneous tissue.  Bleeders again electrocauterized with bipolar.  The palmar fascia was separated, transected along the entire course identifying the superficial palmar arch and the common digital arteries in each of the fingers.  Retractors were placed and the operative microscope was brought into position.  A  periarterial sympathectomy was then performed by removing adventitia over the entire course of the superficial palmar arch which ended prior to giving off a radial digital artery.  This was done with micro instruments.  The entire arch was relieved of its adventitia.  Each of the common digital arteries out to the fingers were then attended to.  The operative microscope was used and the adventitia was removed from each of the common digital arteries for approximately 2 cm distally.  The index showed the greatest degree of adventitial enlargement.  This was released.  The wound was copiously irrigated with saline.  The tip of the index finger was then debrided of necrotic tissue with scissors and a knife to intact subcutaneous tissue.  This was done revealing that much of the skin had healed beneath the necrotic tissue. The nail was cut shorter to allow visualization of tissue but the area of gross gangrene was left intact.  These wounds were then irrigated.  A sterile compressive dressing was applied after closure of the wound on the palm with interrupted 4-0 nylon sutures.  A sterile compressive dressing and volar splint was applied.  The tourniquet was deflated. All fingers immediately pinked.  There was punctate bleeding from the tip of the index finger.  Band-Aids were applied to this area.  She was taken to the recovery room for observation in satisfactory condition. She will be discharged to home to return to the [facility] in 1 week on Percocet.          ______________________________ [name], M.D.     GK/MEDQ  D:  03/04/2016  T:  03/05/2016  Job:  [id number]

## 2016-03-10 MED FILL — CEPHALEXIN 250 MG CAPSULE: 250 | 10 days supply | Qty: 40 | Fill #0

## 2016-03-10 MED FILL — OXYCODONE W/APAP 5/325 TAB: 5-325 | 5 days supply | Qty: 40 | Fill #0

## 2016-03-17 MED FILL — OXYCODONE W/APAP 5/325 TAB: 5-325 | 5 days supply | Qty: 40 | Fill #0

## 2016-03-18 DIAGNOSIS — H5203 Hypermetropia, bilateral: Secondary | ICD-10-CM | POA: Diagnosis not present

## 2016-03-18 DIAGNOSIS — H52223 Regular astigmatism, bilateral: Secondary | ICD-10-CM | POA: Diagnosis not present

## 2016-03-18 DIAGNOSIS — H524 Presbyopia: Secondary | ICD-10-CM | POA: Diagnosis not present

## 2016-04-16 MED FILL — OMEPRAZOLE DR 20 MG CAPSULE: 20 | 30 days supply | Qty: 30 | Fill #2

## 2016-04-16 MED FILL — AMLODIPINE BESYLATE 5 MG TA: 5 | 90 days supply | Qty: 90 | Fill #2

## 2016-04-30 DIAGNOSIS — M349 Systemic sclerosis, unspecified: Secondary | ICD-10-CM | POA: Diagnosis not present

## 2016-04-30 DIAGNOSIS — I96 Gangrene, not elsewhere classified: Secondary | ICD-10-CM | POA: Diagnosis not present

## 2016-04-30 DIAGNOSIS — M79644 Pain in right finger(s): Secondary | ICD-10-CM | POA: Diagnosis not present

## 2016-05-12 DIAGNOSIS — I7300 Raynaud's syndrome without gangrene: Secondary | ICD-10-CM | POA: Diagnosis not present

## 2016-05-12 DIAGNOSIS — M349 Systemic sclerosis, unspecified: Secondary | ICD-10-CM | POA: Diagnosis not present

## 2016-05-12 MED FILL — HYDROCODON-APAP 5-325: 5-325 | 30 days supply | Qty: 90 | Fill #0

## 2016-05-26 ENCOUNTER — Encounter (HOSPITAL_BASED_OUTPATIENT_CLINIC_OR_DEPARTMENT_OTHER): Payer: 59 | Attending: Surgery

## 2016-05-26 DIAGNOSIS — Z87891 Personal history of nicotine dependence: Secondary | ICD-10-CM | POA: Insufficient documentation

## 2016-05-26 DIAGNOSIS — I7301 Raynaud's syndrome with gangrene: Secondary | ICD-10-CM | POA: Insufficient documentation

## 2016-05-26 DIAGNOSIS — S61201A Unspecified open wound of left index finger without damage to nail, initial encounter: Secondary | ICD-10-CM | POA: Diagnosis not present

## 2016-05-26 DIAGNOSIS — L940 Localized scleroderma [morphea]: Secondary | ICD-10-CM | POA: Diagnosis not present

## 2016-05-26 DIAGNOSIS — I10 Essential (primary) hypertension: Secondary | ICD-10-CM | POA: Diagnosis not present

## 2016-05-28 MED FILL — predniSONE 5 MG TABS: 5 | 6 days supply | Qty: 21 | Fill #0

## 2016-06-10 MED FILL — HYDROCODON-APAP 5-325: 5-325 | 30 days supply | Qty: 90 | Fill #0

## 2016-07-06 MED FILL — AMLODIPINE BESYLATE 10 MG T: 10 | 90 days supply | Qty: 90 | Fill #0

## 2016-07-08 MED FILL — HYDROCODON-APAP 5-325: 5-325 | 30 days supply | Qty: 90 | Fill #0

## 2016-07-16 ENCOUNTER — Encounter (HOSPITAL_COMMUNITY): Payer: Self-pay | Admitting: Emergency Medicine

## 2016-07-16 ENCOUNTER — Ambulatory Visit (HOSPITAL_COMMUNITY)
Admission: EM | Admit: 2016-07-16 | Discharge: 2016-07-16 | Disposition: A | Discharge status: Home / Self Care | Payer: 59 | Attending: Family Medicine | Admitting: Family Medicine

## 2016-07-16 DIAGNOSIS — L03012 Cellulitis of left finger: Secondary | ICD-10-CM | POA: Diagnosis not present

## 2016-07-16 DIAGNOSIS — I7300 Raynaud's syndrome without gangrene: Secondary | ICD-10-CM

## 2016-07-16 DIAGNOSIS — L03011 Cellulitis of right finger: Secondary | ICD-10-CM

## 2016-07-16 DIAGNOSIS — L03019 Cellulitis of unspecified finger: Secondary | ICD-10-CM

## 2016-07-16 MED ORDER — KETOROLAC TROMETHAMINE 60 MG/2ML IM SOLN
INTRAMUSCULAR | Status: AC
  Filled 2016-07-16: qty 2

## 2016-07-16 MED ORDER — CEFTRIAXONE SODIUM 1 G IJ SOLR
INTRAMUSCULAR | Status: AC
  Filled 2016-07-16: qty 10

## 2016-07-16 MED ORDER — KETOROLAC TROMETHAMINE 60 MG/2ML IM SOLN
60.0000 mg | Freq: Once | INTRAMUSCULAR | Status: AC
  Administered 2016-07-16: 60 mg via INTRAMUSCULAR

## 2016-07-16 MED ORDER — CEFTRIAXONE SODIUM 1 G IJ SOLR
1.0000 g | Freq: Once | INTRAMUSCULAR | Status: AC
  Administered 2016-07-16: 1 g via INTRAMUSCULAR

## 2016-07-16 MED ORDER — AMOXICILLIN-POT CLAVULANATE 875-125 MG PO TABS: 1.0000 | ORAL_TABLET | Freq: Two times a day (BID) | ORAL | 0 refills | Status: DC

## 2016-07-16 MED ORDER — LIDOCAINE HCL (PF) 1 % IJ SOLN
INTRAMUSCULAR | Status: AC
  Filled 2016-07-16: qty 2

## 2016-07-16 MED ORDER — METHYLPREDNISOLONE 4 MG PO TBPK: ORAL_TABLET | ORAL | 0 refills | Status: DC

## 2016-07-16 MED FILL — AMOX-CLAV 875-125 MG TABLET: 875-125 | 10 days supply | Qty: 20 | Fill #0

## 2016-07-16 MED FILL — METHYLPREDNISOLONE 4 MG TAB: 4 | 6 days supply | Qty: 21 | Fill #0

## 2016-07-16 NOTE — ED Provider Notes (Signed)
CSN: [id number]     Arrival date & time 07/16/16  1430 History   First MD Initiated Contact with Patient 07/16/16 1451     Chief Complaint  Patient presents with  . Cellulitis   (Consider location/radiation/quality/duration/timing/severity/associated sxs/prior Treatment) Patient has hx of scleroderma and raynaud's disease.  She is having severe pain in her finger tips on both hands.  She has developed ulcers and cellulitis.  In the past antibiotics and steroids have helped.     The history is provided by the patient.  Hand Pain  This is a new problem. The problem occurs constantly. Nothing aggravates the symptoms. Nothing relieves the symptoms.    Past Medical History:  Diagnosis Date  . Cervical spondylosis without myelopathy   . CREST (calcinosis, Raynaud's phenomenon, esophageal dysfunction, sclerodactyly, telangiectasia) ([place])   . Esophageal stricture - GERD 07/23/2014  . GERD (gastroesophageal reflux disease)   . Hx of adenomatous polyp of colon 10/14/2014  . Hypertension   . IBS (irritable bowel syndrome)   . Raynaud's disease, idiopathic   . Seasonal allergies   . Spinal stenosis   . Tobacco use   . Vitamin B12 deficiency    Past Surgical History:  Procedure Laterality Date  . BREAST SURGERY     benign breast cysts  . CERVICAL FUSION    . COLONOSCOPY    . DERMOID CYST  EXCISION    . 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 HEMORRHOID SURGERY    . IR GENERIC HISTORICAL  01/29/2016   IR ANGIOGRAM EXTREMITY LEFT 01/29/2016 [name], MD [facility]  . IR GENERIC HISTORICAL  01/29/2016   IR US GUIDE VASC ACCESS RIGHT 01/29/2016 [name], MD [facility]  . NAILBED REPAIR Right 01/30/2013   Procedure: BIOPSY OF RIGHT LONG NAIL FOLD;  Surgeon: [name]., MD;  Location: [facility];  Service: Orthopedics;  Laterality: Right;  Penrose used as tourniquet--on at 0944, off at 1001.  . OOPHORECTOMY     Right ovary removal only  . SYMPATHECTOMY  Right 01/30/2015   Procedure: DIGITAL SYMPATHECTOMY, RIGHT RADIAL ARTERY, ULNAR ARTERY, ARCH DIGITAL VESSELS INDEX MIDDLE RING SMALL GRAFT ULNAR ARTERY ;  Surgeon: [name], MD;  Location: [facility];  Service: Orthopedics;  Laterality: Right;  . SYMPATHECTOMY Left 03/04/2016   Procedure: Digital SYMPATHECTOMY, left radial, ulnar, arch and common digital times 3;  Surgeon: [name], MD;  Location: [facility];  Service: Orthopedics;  Laterality: Left;  Digital SYMPATHECTOMY, left radial, ulnar, arch and common digital times 3   Family History  Problem Relation Age of Onset  . ALS Mother   . Diabetes Other   . Hypertension Other   . Stroke Other     F 1st degree relative <60   Social History  Substance Use Topics  . Smoking status: Former Smoker    Packs/day: 0.50    Years: 25.00    Types: Cigarettes    Quit date: 11/13/2014  . Smokeless tobacco: Never Used     Comment: Quit smoking 3 months ago  . Alcohol use 12.0 oz/week    20 Glasses of wine per week     Comment:  3-4 drinks a week   OB History    No data available     Review of Systems  Constitutional: Negative.   HENT: Negative.   Eyes: Negative.   Respiratory: Negative.   Cardiovascular: Negative.   Gastrointestinal: Negative.  Endocrine: Negative.   Genitourinary: Negative.   Musculoskeletal: Positive for arthralgias.  Allergic/Immunologic: Negative.   Neurological: Negative.   Hematological: Negative.   Psychiatric/Behavioral: Negative.     Allergies  Patient has no known allergies.  Home Medications   Prior to Admission medications   Medication Sig Start Date End Date Taking? Authorizing Provider  amLODipine (NORVASC) 5 MG tablet Take 10 mg by mouth daily.    Yes Historical Provider, MD  amoxicillin-clavulanate (AUGMENTIN) 875-125 MG tablet Take 1 tablet by mouth 2 (two) times daily. 07/16/16   Lysbeth Penner, FNP  Biotin 10 MG CAPS Take by mouth.    Historical Provider,  MD  HYDROcodone-acetaminophen (NORCO/VICODIN) 5-325 MG tablet Take 2 tablets by mouth every 4 (four) hours as needed. Patient taking differently: Take 1 tablet by mouth every 4 (four) hours as needed.  01/18/16   Sherlene Shams, MD  methylPREDNISolone (MEDROL DOSEPAK) 4 MG TBPK tablet Take 6-5-4-3-2-1 po qd 07/16/16   Lysbeth Penner, FNP  Naproxen Sodium (ALEVE PO) Take by mouth.    Historical Provider, MD  omeprazole (PRILOSEC) 20 MG capsule Take 20 mg by mouth daily.    Historical Provider, MD  oxyCODONE-acetaminophen (PERCOCET) 7.5-325 MG tablet Take 1 tablet by mouth every 4 (four) hours as needed for severe pain. 03/04/16   [name], MD   Meds Ordered and Administered this Visit   Medications  cefTRIAXone (ROCEPHIN) injection 1 g (not administered)  ketorolac (TORADOL) injection 60 mg (not administered)    BP (!) 158/105 (BP Location: Right Arm)   Pulse (!) 113   Temp 98.5 F (36.9 C) (Oral)   Resp 20   LMP 01/28/2010 (Approximate)   SpO2 100%  No data found.   Physical Exam  Constitutional: She appears well-developed and well-nourished.  HENT:  Head: Normocephalic and atraumatic.  Eyes: Conjunctivae and EOM are normal. Pupils are equal, round, and reactive to light.  Neck: Normal range of motion. Neck supple.  Cardiovascular: Normal rate, regular rhythm and normal heart sounds.   Pulmonary/Chest: Effort normal and breath sounds normal.  Musculoskeletal: She exhibits tenderness.  Bilateral index fingers with erythema, tenderness, and ulcer formation.  Bilateral fingers with Raynauds and mild erythema.  Nursing note and vitals reviewed.   Urgent Care Course     Procedures (including critical care time)  Labs Review Labs Reviewed - No data to display  Imaging Review No results found.   Visual Acuity Review  Right Eye Distance:   Left Eye Distance:   Bilateral Distance:    Right Eye Near:   Left Eye Near:    Bilateral Near:         MDM   1.  Cellulitis of finger, unspecified laterality   2. Raynaud's disease without gangrene    Augmentin 875mg  one po bid x 10 days Rocephin 1 gram IM  Medrol dose pack as directed 4mg  #21 Toradol 60mg  IM  Please call RA doctor and inform him of flare  Follow up prn.     Lysbeth Penner, FNP 07/16/16 [phone]

## 2016-07-16 NOTE — ED Triage Notes (Signed)
Pt w/hx of CREST syndrome... Reports she had surgery back in 02/2016  See Dr. Flonnie Overman, rheumatologist at [facility]  Sx today include pain at finger tips, drainage, ulcers  A&O x4... NAD

## 2016-07-19 DIAGNOSIS — I96 Gangrene, not elsewhere classified: Secondary | ICD-10-CM | POA: Diagnosis not present

## 2016-07-19 DIAGNOSIS — M349 Systemic sclerosis, unspecified: Secondary | ICD-10-CM | POA: Diagnosis not present

## 2016-07-28 DIAGNOSIS — I96 Gangrene, not elsewhere classified: Secondary | ICD-10-CM | POA: Diagnosis not present

## 2016-07-28 DIAGNOSIS — M349 Systemic sclerosis, unspecified: Secondary | ICD-10-CM | POA: Diagnosis not present

## 2016-07-29 MED FILL — HYDROCODON-APAP 7.5-325: 7.5-325 | 5 days supply | Qty: 30 | Fill #0

## 2016-08-04 ENCOUNTER — Other Ambulatory Visit: Payer: Self-pay | Admitting: Orthopedic Surgery

## 2016-08-06 ENCOUNTER — Encounter (HOSPITAL_BASED_OUTPATIENT_CLINIC_OR_DEPARTMENT_OTHER): Payer: Self-pay | Admitting: *Deleted

## 2016-08-09 ENCOUNTER — Encounter (HOSPITAL_BASED_OUTPATIENT_CLINIC_OR_DEPARTMENT_OTHER): Payer: Self-pay | Admitting: Anesthesiology

## 2016-08-10 ENCOUNTER — Encounter (HOSPITAL_BASED_OUTPATIENT_CLINIC_OR_DEPARTMENT_OTHER)
Admission: RE | Disposition: A | Discharge status: Home / Self Care | Payer: Self-pay | Source: Ambulatory Visit | Attending: Orthopedic Surgery

## 2016-08-10 ENCOUNTER — Encounter (HOSPITAL_BASED_OUTPATIENT_CLINIC_OR_DEPARTMENT_OTHER): Payer: Self-pay | Admitting: *Deleted

## 2016-08-10 ENCOUNTER — Ambulatory Visit (HOSPITAL_BASED_OUTPATIENT_CLINIC_OR_DEPARTMENT_OTHER): Payer: 59 | Admitting: Anesthesiology

## 2016-08-10 ENCOUNTER — Ambulatory Visit (HOSPITAL_BASED_OUTPATIENT_CLINIC_OR_DEPARTMENT_OTHER)
Admission: RE | Admit: 2016-08-10 | Discharge: 2016-08-10 | Disposition: A | Discharge status: Home / Self Care | Payer: 59 | Source: Ambulatory Visit | Attending: Orthopedic Surgery | Admitting: Orthopedic Surgery

## 2016-08-10 DIAGNOSIS — Z79899 Other long term (current) drug therapy: Secondary | ICD-10-CM | POA: Diagnosis not present

## 2016-08-10 DIAGNOSIS — K219 Gastro-esophageal reflux disease without esophagitis: Secondary | ICD-10-CM | POA: Insufficient documentation

## 2016-08-10 DIAGNOSIS — Z87891 Personal history of nicotine dependence: Secondary | ICD-10-CM | POA: Diagnosis not present

## 2016-08-10 DIAGNOSIS — I96 Gangrene, not elsewhere classified: Secondary | ICD-10-CM | POA: Insufficient documentation

## 2016-08-10 DIAGNOSIS — M868X4 Other osteomyelitis, hand: Secondary | ICD-10-CM | POA: Diagnosis not present

## 2016-08-10 DIAGNOSIS — M869 Osteomyelitis, unspecified: Secondary | ICD-10-CM | POA: Insufficient documentation

## 2016-08-10 DIAGNOSIS — B965 Pseudomonas (aeruginosa) (mallei) (pseudomallei) as the cause of diseases classified elsewhere: Secondary | ICD-10-CM | POA: Diagnosis not present

## 2016-08-10 DIAGNOSIS — Z791 Long term (current) use of non-steroidal anti-inflammatories (NSAID): Secondary | ICD-10-CM | POA: Insufficient documentation

## 2016-08-10 DIAGNOSIS — M341 CR(E)ST syndrome: Secondary | ICD-10-CM | POA: Insufficient documentation

## 2016-08-10 DIAGNOSIS — M349 Systemic sclerosis, unspecified: Secondary | ICD-10-CM | POA: Diagnosis not present

## 2016-08-10 DIAGNOSIS — I10 Essential (primary) hypertension: Secondary | ICD-10-CM | POA: Diagnosis not present

## 2016-08-10 DIAGNOSIS — B957 Other staphylococcus as the cause of diseases classified elsewhere: Secondary | ICD-10-CM | POA: Diagnosis not present

## 2016-08-10 DIAGNOSIS — M868X0 Other osteomyelitis, multiple sites: Secondary | ICD-10-CM | POA: Diagnosis not present

## 2016-08-10 HISTORY — PX: AMPUTATION: SHX166

## 2016-08-10 SURGERY — AMPUTATION DIGIT
Anesthesia: General | Site: Finger | Laterality: Bilateral

## 2016-08-10 MED ORDER — FENTANYL CITRATE (PF) 100 MCG/2ML IJ SOLN
INTRAMUSCULAR | Status: AC
  Filled 2016-08-10: qty 2

## 2016-08-10 MED ORDER — PROPOFOL 10 MG/ML IV BOLUS
INTRAVENOUS | Status: DC | PRN
  Administered 2016-08-10: 150 mg via INTRAVENOUS

## 2016-08-10 MED ORDER — ONDANSETRON HCL 4 MG/2ML IJ SOLN
INTRAMUSCULAR | Status: AC
  Filled 2016-08-10: qty 2

## 2016-08-10 MED ORDER — PROPOFOL 10 MG/ML IV BOLUS
INTRAVENOUS | Status: AC
  Filled 2016-08-10: qty 20

## 2016-08-10 MED ORDER — DEXAMETHASONE SODIUM PHOSPHATE 10 MG/ML IJ SOLN
INTRAMUSCULAR | Status: AC
  Filled 2016-08-10: qty 1

## 2016-08-10 MED ORDER — MIDAZOLAM HCL 5 MG/5ML IJ SOLN
INTRAMUSCULAR | Status: DC | PRN
  Administered 2016-08-10: 2 mg via INTRAVENOUS

## 2016-08-10 MED ORDER — CHLORHEXIDINE GLUCONATE 4 % EX LIQD: 60.0000 mL | Freq: Once | CUTANEOUS | Status: DC

## 2016-08-10 MED ORDER — SULFAMETHOXAZOLE-TRIMETHOPRIM 800-160 MG PO TABS: 1.0000 | ORAL_TABLET | Freq: Two times a day (BID) | ORAL | 0 refills | Status: DC

## 2016-08-10 MED ORDER — BUPIVACAINE HCL (PF) 0.25 % IJ SOLN
INTRAMUSCULAR | Status: DC | PRN
Start: 2016-08-10 — End: 2016-08-10
  Administered 2016-08-10: 14 mL

## 2016-08-10 MED ORDER — OXYCODONE HCL 5 MG PO TABS: 5.0000 mg | ORAL_TABLET | Freq: Once | ORAL | Status: DC | PRN

## 2016-08-10 MED ORDER — MIDAZOLAM HCL 2 MG/2ML IJ SOLN
INTRAMUSCULAR | Status: AC
  Filled 2016-08-10: qty 2

## 2016-08-10 MED ORDER — ONDANSETRON HCL 4 MG/2ML IJ SOLN
INTRAMUSCULAR | Status: DC | PRN
  Administered 2016-08-10: 4 mg via INTRAVENOUS

## 2016-08-10 MED ORDER — PROMETHAZINE HCL 25 MG/ML IJ SOLN: 6.2500 mg | INTRAMUSCULAR | Status: DC | PRN

## 2016-08-10 MED ORDER — OXYCODONE-ACETAMINOPHEN 7.5-325 MG PO TABS: 1.0000 | ORAL_TABLET | ORAL | 0 refills | Status: DC | PRN

## 2016-08-10 MED ORDER — LACTATED RINGERS IV SOLN
INTRAVENOUS | Status: DC
  Administered 2016-08-10 (×2): via INTRAVENOUS

## 2016-08-10 MED ORDER — PHENYLEPHRINE HCL 10 MG/ML IJ SOLN
INTRAMUSCULAR | Status: DC | PRN
  Administered 2016-08-10 (×4): 80 ug via INTRAVENOUS

## 2016-08-10 MED ORDER — LIDOCAINE 2% (20 MG/ML) 5 ML SYRINGE
INTRAMUSCULAR | Status: AC
  Filled 2016-08-10: qty 5

## 2016-08-10 MED ORDER — OXYCODONE HCL 5 MG/5ML PO SOLN: 5.0000 mg | Freq: Once | ORAL | Status: DC | PRN

## 2016-08-10 MED ORDER — FENTANYL CITRATE (PF) 100 MCG/2ML IJ SOLN: 50.0000 ug | INTRAMUSCULAR | Status: DC | PRN

## 2016-08-10 MED ORDER — DEXAMETHASONE SODIUM PHOSPHATE 4 MG/ML IJ SOLN
INTRAMUSCULAR | Status: DC | PRN
  Administered 2016-08-10: 10 mg via INTRAVENOUS

## 2016-08-10 MED ORDER — SCOPOLAMINE 1 MG/3DAYS TD PT72: 1.0000 | MEDICATED_PATCH | Freq: Once | TRANSDERMAL | Status: DC | PRN

## 2016-08-10 MED ORDER — CEFAZOLIN SODIUM-DEXTROSE 2-4 GM/100ML-% IV SOLN
2.0000 g | INTRAVENOUS | Status: AC
  Administered 2016-08-10: 2 g via INTRAVENOUS

## 2016-08-10 MED ORDER — FENTANYL CITRATE (PF) 100 MCG/2ML IJ SOLN
INTRAMUSCULAR | Status: DC | PRN
  Administered 2016-08-10 (×2): 25 ug via INTRAVENOUS
  Administered 2016-08-10: 50 ug via INTRAVENOUS
  Administered 2016-08-10: 100 ug via INTRAVENOUS

## 2016-08-10 MED ORDER — HYDROMORPHONE HCL 1 MG/ML IJ SOLN
0.2500 mg | INTRAMUSCULAR | Status: DC | PRN
  Administered 2016-08-10 (×2): 0.25 mg via INTRAVENOUS

## 2016-08-10 MED ORDER — NALOXONE HCL 0.4 MG/ML IJ SOLN
INTRAMUSCULAR | Status: AC
  Filled 2016-08-10: qty 1

## 2016-08-10 MED ORDER — PHENYLEPHRINE 40 MCG/ML (10ML) SYRINGE FOR IV PUSH (FOR BLOOD PRESSURE SUPPORT)
PREFILLED_SYRINGE | INTRAVENOUS | Status: AC
  Filled 2016-08-10: qty 10

## 2016-08-10 MED ORDER — HYDROMORPHONE HCL 1 MG/ML IJ SOLN
INTRAMUSCULAR | Status: AC
  Filled 2016-08-10: qty 1

## 2016-08-10 MED ORDER — MIDAZOLAM HCL 2 MG/2ML IJ SOLN: 1.0000 mg | INTRAMUSCULAR | Status: DC | PRN

## 2016-08-10 MED ORDER — LIDOCAINE HCL (CARDIAC) 20 MG/ML IV SOLN
INTRAVENOUS | Status: DC | PRN
  Administered 2016-08-10: 30 mg via INTRAVENOUS

## 2016-08-10 MED ORDER — CEFAZOLIN SODIUM-DEXTROSE 2-4 GM/100ML-% IV SOLN
INTRAVENOUS | Status: AC
  Filled 2016-08-10: qty 100

## 2016-08-10 MED ORDER — MEPERIDINE HCL 25 MG/ML IJ SOLN: 6.2500 mg | INTRAMUSCULAR | Status: DC | PRN

## 2016-08-10 MED FILL — SULFAMETHOXAZOLE/TMP DS TAB: 800-160 | 10 days supply | Qty: 20 | Fill #0

## 2016-08-10 MED FILL — OXYCODONE-APAP 7.5-325 MG: 7.5-325 | 5 days supply | Qty: 30 | Fill #0

## 2016-08-10 SURGICAL SUPPLY — 42 items
BLADE MINI RND TIP GREEN BEAV (BLADE) IMPLANT
BLADE OSC/SAG .038X5.5 CUT EDG (BLADE) IMPLANT
BLADE SURG 15 STRL LF DISP TIS (BLADE) ×2 IMPLANT
BLADE SURG 15 STRL SS (BLADE) ×2
BNDG COHESIVE 1X5 TAN STRL LF (GAUZE/BANDAGES/DRESSINGS) ×4 IMPLANT
BNDG ESMARK 4X9 LF (GAUZE/BANDAGES/DRESSINGS) ×2 IMPLANT
CHLORAPREP W/TINT 26ML (MISCELLANEOUS) ×4 IMPLANT
CORDS BIPOLAR (ELECTRODE) ×2 IMPLANT
COVER BACK TABLE 60X90IN (DRAPES) ×2 IMPLANT
COVER MAYO STAND STRL (DRAPES) ×4 IMPLANT
CUFF TOURN SGL LL 12 (TOURNIQUET CUFF) ×4 IMPLANT
CUFF TOURNIQUET SINGLE 18IN (TOURNIQUET CUFF) IMPLANT
DECANTER SPIKE VIAL GLASS SM (MISCELLANEOUS) IMPLANT
DRAIN PENROSE 1/4X12 LTX STRL (WOUND CARE) IMPLANT
DRAPE EXTREMITY T 121X128X90 (DRAPE) ×4 IMPLANT
DRAPE OEC MINIVIEW 54X84 (DRAPES) IMPLANT
DRAPE SURG 17X23 STRL (DRAPES) ×4 IMPLANT
GAUZE SPONGE 4X4 12PLY STRL (GAUZE/BANDAGES/DRESSINGS) ×2 IMPLANT
GAUZE XEROFORM 1X8 LF (GAUZE/BANDAGES/DRESSINGS) ×2 IMPLANT
GLOVE BIOGEL PI IND STRL 8.5 (GLOVE) ×1 IMPLANT
GLOVE BIOGEL PI INDICATOR 8.5 (GLOVE) ×1
GLOVE SURG ORTHO 8.0 STRL STRW (GLOVE) ×4 IMPLANT
GOWN STRL REUS W/ TWL LRG LVL3 (GOWN DISPOSABLE) ×1 IMPLANT
GOWN STRL REUS W/TWL LRG LVL3 (GOWN DISPOSABLE) ×1
GOWN STRL REUS W/TWL XL LVL3 (GOWN DISPOSABLE) ×2 IMPLANT
NEEDLE PRECISIONGLIDE 27X1.5 (NEEDLE) ×2 IMPLANT
NS IRRIG 1000ML POUR BTL (IV SOLUTION) ×2 IMPLANT
PACK BASIN DAY SURGERY FS (CUSTOM PROCEDURE TRAY) ×2 IMPLANT
PADDING CAST ABS 4INX4YD NS (CAST SUPPLIES) ×1
PADDING CAST ABS COTTON 4X4 ST (CAST SUPPLIES) ×1 IMPLANT
SPLINT FINGER 3.25 BULB 911905 (SOFTGOODS) ×4 IMPLANT
SPLINT FINGER 4.25 BULB 911906 (SOFTGOODS) IMPLANT
STOCKINETTE 4X48 STRL (DRAPES) ×4 IMPLANT
SUT CHROMIC 4 0 P 3 18 (SUTURE) IMPLANT
SUT ETHILON 4 0 PS 2 18 (SUTURE) ×2 IMPLANT
SUT MERSILENE 4 0 P 3 (SUTURE) IMPLANT
SUT VIC AB 4-0 P-3 18XBRD (SUTURE) IMPLANT
SUT VIC AB 4-0 P3 18 (SUTURE)
SYR BULB 3OZ (MISCELLANEOUS) ×2 IMPLANT
SYR CONTROL 10ML LL (SYRINGE) ×2 IMPLANT
TOWEL OR 17X24 6PK STRL BLUE (TOWEL DISPOSABLE) ×2 IMPLANT
UNDERPAD 30X30 (UNDERPADS AND DIAPERS) ×2 IMPLANT

## 2016-08-10 NOTE — H&P (Signed)
Lisa Long is an 51 y.o. female.   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 She continues to complain of severe pain in the index finger. She is also having pain in her right index finger. She is status post digital symptom sympathectomies on each side. She has been taking Ultram which is becoming less effective for her. She is also on Celebrex. He has maintained excellent mobility to the fingers    Lisa Long is a 51 yo female  Past Medical History:  Diagnosis Date  . Cervical spondylosis without myelopathy   . CREST (calcinosis, Raynaud's phenomenon, esophageal dysfunction, sclerodactyly, telangiectasia) ([place])   . Esophageal stricture - GERD 07/23/2014  . GERD (gastroesophageal reflux disease)   . Hx of adenomatous polyp of colon 10/14/2014  . Hypertension   . IBS (irritable bowel syndrome)   . Raynaud's disease, idiopathic   . Seasonal allergies   . Spinal stenosis   . Tobacco use   . Vitamin B12 deficiency     Past Surgical History:  Procedure Laterality Date  . BREAST SURGERY     benign breast cysts  . CERVICAL FUSION    . COLONOSCOPY    . DERMOID CYST  EXCISION    . 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 HEMORRHOID SURGERY    . IR GENERIC HISTORICAL  01/29/2016   IR ANGIOGRAM EXTREMITY LEFT 01/29/2016 Lisa Daft, MD [facility]  . IR GENERIC HISTORICAL  01/29/2016   IR US GUIDE VASC ACCESS RIGHT 01/29/2016 Lisa Daft, MD [facility]  . NAILBED REPAIR Right 01/30/2013   Procedure: BIOPSY OF RIGHT LONG NAIL FOLD;  Surgeon: Lisa Sickle., MD;  Location: [facility];  Service: Orthopedics;  Laterality: Right;  [place] used as tourniquet--on at 0944, off at 1001.  . OOPHORECTOMY     Right ovary removal only  . SYMPATHECTOMY Right 01/30/2015   Procedure: DIGITAL SYMPATHECTOMY, RIGHT RADIAL ARTERY, ULNAR ARTERY, ARCH DIGITAL VESSELS INDEX MIDDLE RING SMALL GRAFT  ULNAR ARTERY ;  Surgeon: Lisa Brod, MD;  Location: [facility];  Service: Orthopedics;  Laterality: Right;  . SYMPATHECTOMY Left 03/04/2016   Procedure: Digital SYMPATHECTOMY, left radial, ulnar, arch and common digital times 3;  Surgeon: Lisa Brod, MD;  Location: [facility];  Service: Orthopedics;  Laterality: Left;  Digital SYMPATHECTOMY, left radial, ulnar, arch and common digital times 3    Family History  Problem Relation Age of Onset  . ALS Mother   . Diabetes Other   . Hypertension Other   . Stroke Other     F 1st degree relative <60   Social History:  reports that she quit smoking about 20 months ago. Her smoking use included Cigarettes. She has a 12.50 pack-year smoking history. She has never used smokeless tobacco. She reports that she drinks about 12.0 oz of alcohol per week . She reports that she does not use drugs.  Allergies: No Known Allergies  No prescriptions prior to admission.    No results found for this or any previous visit (from the past 48 hour(s)).  No results found.   Pertinent items are noted in HPI.  Height 5\' 8"  (1.727 m), weight 49.9 kg (110 lb), last menstrual period 01/28/2010.  General appearance: appears stated age Head: Normocephalic, without obvious abnormality Neck: no JVD Resp: clear to auscultation bilaterally Cardio: regular rate and rhythm, S1, S2  normal, no murmur, click, rub or gallop GI: soft, non-tender; bowel sounds normal; no masses,  no organomegaly Extremities: gangrene index fingers bilaterally Pulses: 2+ and symmetric Skin: Skin color, texture, turgor normal. 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

## 2016-08-10 NOTE — Anesthesia Postprocedure Evaluation (Signed)
Anesthesia Post Note  Patient: Lisa Long  Procedure(s) Performed: Procedure(s) (LRB): AMPUTATION BILATERAL INDEX FINGERS (Bilateral)  Patient location during evaluation: PACU Anesthesia Type: General Level of consciousness: awake and alert Pain management: pain level controlled Vital Signs Assessment: post-procedure vital signs reviewed and stable Respiratory status: spontaneous breathing, nonlabored ventilation and respiratory function stable Cardiovascular status: blood pressure returned to baseline and stable Postop Assessment: no signs of nausea or vomiting Anesthetic complications: no       Last Vitals:  Vitals:   08/10/16 1345 08/10/16 1413  BP: 138/81 (!) 152/90  Pulse: 92 81  Resp: 17 18  Temp:  36.8 C    Last Pain:  Vitals:   08/10/16 1413  TempSrc:   PainSc: 1                  Lynda Rainwater

## 2016-08-10 NOTE — Transfer of Care (Signed)
Immediate Anesthesia Transfer of Care Note  Patient: Lisa Long  Procedure(s) Performed: Procedure(s): AMPUTATION BILATERAL INDEX FINGERS (Bilateral)  Patient Location: PACU  Anesthesia Type:General  Level of Consciousness: awake  Airway & Oxygen Therapy: Patient Spontanous Breathing  Post-op Assessment: Report given to RN and Post -op Vital signs reviewed and stable  Post vital signs: Reviewed and stable  Last Vitals:  Vitals:   08/10/16 1137  BP: 113/87  Pulse: (!) 122  Resp: 20  Temp: 36.9 C    Last Pain:  Vitals:   08/10/16 1137  TempSrc: Oral  PainSc: 4          Complications: No apparent anesthesia complications

## 2016-08-10 NOTE — Discharge Instructions (Signed)

## 2016-08-10 NOTE — Brief Op Note (Signed)
08/10/2016  1:07 PM  PATIENT:  Lisa Long  51 y.o. female  PRE-OPERATIVE DIAGNOSIS:  GANGRENE BILATERAL INDEX FINGERS  POST-OPERATIVE DIAGNOSIS:  GANGRENE BILATERAL INDEX FINGERS  PROCEDURE:  Procedure(s): AMPUTATION BILATERAL INDEX FINGERS (Bilateral)  SURGEON:  Surgeon(s) and Role:    [name], MD - Primary  PHYSICIAN ASSISTANT:   ASSISTANTS: none   ANESTHESIA:   local and general  EBL:  Total I/O In: 1600 [I.V.:1600] Out: 2 [Blood:2]  BLOOD ADMINISTERED:none  DRAINS: none   LOCAL MEDICATIONS USED:  BUPIVICAINE   SPECIMEN:  Excision  DISPOSITION OF SPECIMEN:  PATHOLOGY  COUNTS:  YES  TOURNIQUET:   Total Tourniquet Time Documented: Upper Arm (Right) - 14 minutes Total: Upper Arm (Right) - 14 minutes  Forearm (Left) - 14 minutes Total: Forearm (Left) - 14 minutes   DICTATION: .Other Dictation: Dictation Number [id number]  PLAN OF CARE: Discharge to home after PACU  PATIENT DISPOSITION:  PACU - hemodynamically stable.

## 2016-08-10 NOTE — Anesthesia Preprocedure Evaluation (Signed)
Anesthesia Evaluation  Patient identified by MRN, date of birth, ID band Patient awake  General Assessment Comment:CREST (calcinosis, Raynaud's phenomenon, esophageal dysfunction, sclerodactyly, telangiectasia) ([place])  Reviewed: Allergy & Precautions, NPO status , Patient's Chart, lab work & pertinent test results  Airway Mallampati: II  TM Distance: >3 FB Neck ROM: Full    Dental no notable dental hx.    Pulmonary neg pulmonary ROS, former smoker,    Pulmonary exam normal breath sounds clear to auscultation       Cardiovascular hypertension, + Peripheral Vascular Disease  Normal cardiovascular exam Rhythm:Regular Rate:Normal     Neuro/Psych negative neurological ROS  negative psych ROS   GI/Hepatic negative GI ROS, Neg liver ROS, GERD  ,  Endo/Other  negative endocrine ROS  Renal/GU negative Renal ROS  negative genitourinary   Musculoskeletal negative musculoskeletal ROS (+) Arthritis ,   Abdominal   Peds negative pediatric ROS (+)  Hematology negative hematology ROS (+)   Anesthesia Other Findings   Reproductive/Obstetrics negative OB ROS                             Anesthesia Physical  Anesthesia Plan  ASA: III  Anesthesia Plan: General   Post-op Pain Management: GA combined w/ Regional for post-op pain   Induction: Intravenous  Airway Management Planned: LMA  Additional Equipment:   Intra-op Plan:   Post-operative Plan:   Informed Consent: I have reviewed the patients History and Physical, chart, labs and discussed the procedure including the risks, benefits and alternatives for the proposed anesthesia with the patient or authorized representative who has indicated his/her understanding and acceptance.   Dental advisory given  Plan Discussed with: CRNA and Surgeon  Anesthesia Plan Comments:         Anesthesia Quick Evaluation

## 2016-08-10 NOTE — Anesthesia Procedure Notes (Signed)
Procedure Name: LMA Insertion Date/Time: 08/10/2016 12:18 PM Performed by: Toula Moos L Pre-anesthesia Checklist: Patient identified, Emergency Drugs available, Suction available, Patient being monitored and Timeout performed Patient Re-evaluated:Patient Re-evaluated prior to inductionOxygen Delivery Method: Circle system utilized Preoxygenation: Pre-oxygenation with 100% oxygen Intubation Type: IV induction Ventilation: Mask ventilation without difficulty LMA: LMA inserted LMA Size: 4.0 Number of attempts: 1 Airway Equipment and Method: Bite block Placement Confirmation: positive ETCO2 Tube secured with: Tape Dental Injury: Teeth and Oropharynx as per pre-operative assessment

## 2016-08-10 NOTE — Op Note (Signed)
Dictation Number [id number]

## 2016-08-10 NOTE — Op Note (Signed)
NAMEMarland Long  [name], WITTMANN NO.:  [id number]  MEDICAL RECORD NO.:  [id number]  LOCATION:                                 FACILITY:  PHYSICIAN:  [name], M.D.       DATE OF BIRTH:  08-09-1965  DATE OF PROCEDURE:  08/10/2016 DATE OF DISCHARGE:                              OPERATIVE REPORT   PREOPERATIVE DIAGNOSIS:  Scleroderma with gangrene, bilateral index fingers.  POSTOPERATIVE DIAGNOSIS:  Scleroderma with gangrene, bilateral index fingers.  OPERATION:  Amputation index fingers bilaterally through the distal phalanx on the right and the distal interphalangeal joint, left.  SURGEON:  [name], MD.  ASSISTANT:  None.  ANESTHESIA:  General.  [facility]ACE OF SURGERY[facility]  ANESTHESIOLOGISTSabra Heck.  HISTORY:  The patient is a 51 year old female with a history of scleroderma.  She has undergone periarterial sympathectomies bilaterally.  She has had gangrene occur to the tips of the index fingers bilaterally.  This resulted in considerable pain.  She is admitted now for amputations of the gangrenous portions.  Pre, peri, and postoperative course have been discussed along with risks and complications.  She is aware that there is no guarantee to the surgery; the possibility of infection; recurrence of injury to arteries, nerves, tendons; incomplete relief of symptoms; dystrophy.  In the preoperative area, the patient was seen, the extremity marked by both patient and surgeon.  Antibiotic given.  PROCEDURE IN DETAIL:  The patient was brought to the operating room, where general anesthetic was carried out without difficulty under the direction of Dr. Sabra Heck.  She was prepped using ChloraPrep in the supine position with both arms free.  The right arm was attended to first.  The limb was exsanguinated with an Esmarch bandage after time-out was taken confirming the patient and procedure.  The area of the amputation was marked out to remove the  nail matrix proximally, dorsally and create a volar flap palmarly.  This was developed through deep tissues.  The gangrenous portion was included and amputation performed through the proximal aspect of the distal phalanx through portion of the insertion of the flexor digitorum profundus tendon.  The end of the bone was shaped.  This was done with a rongeur.  The wound was copiously irrigated with saline.  The skin was closed with interrupted 4-0 nylon sutures in both horizontal and in interrupted manner.  A metacarpal block was given at the beginning of the procedure.  This entailed 7 mL of 0.25% bupivacaine without epinephrine.  Following application of a sterile dressing and splint, the tourniquet was deflated.  All remaining fingers pinked.  The left side was attended to next.  Again, a time-out was taken.  The prep was done with ChloraPrep.  The limb was exsanguinated with an Esmarch bandage.  Tourniquet placed on forearm, was inflated to 250 mmHg.  This was done after exsanguination with an Esmarch bandage.  This side showed purulent material at the tip. Cultures were taken for both aerobic and anaerobic cultures.  The incision was marked for disarticulation at the distal interphalangeal joint.  The dissection carried down creating a volar flap distally.  The entire distal  phalanx was excised and sent to Pathology.  The wound was copiously irrigated with saline and the volar flap rotated dorsally allowing closure of the amputation loosely with interrupted 4-0 nylon sutures in both horizontal mattress and simple sutures.  A sterile compressive dressing was applied at that side also.  Before the incisions were made, the left side was given a metacarpal block with 0.25% bupivacaine without epinephrine and 8 mL was used.  The patient tolerated the procedure well.  She will be discharged home to return to the [facility] in 1 week, on [place] and [facility].           ______________________________ [name], M.D.     GK/MEDQ  D:  08/10/2016  T:  08/10/2016  Job:  [id number]

## 2016-08-11 ENCOUNTER — Encounter (HOSPITAL_BASED_OUTPATIENT_CLINIC_OR_DEPARTMENT_OTHER): Payer: Self-pay | Admitting: Orthopedic Surgery

## 2016-08-16 LAB — AEROBIC/ANAEROBIC CULTURE W GRAM STAIN (SURGICAL/DEEP WOUND)

## 2016-08-16 LAB — AEROBIC/ANAEROBIC CULTURE (SURGICAL/DEEP WOUND)

## 2016-08-18 MED FILL — OXYCODONE-APAP 7.5-325 MG: 7.5-325 | 8 days supply | Qty: 30 | Fill #0

## 2016-09-24 DIAGNOSIS — M349 Systemic sclerosis, unspecified: Secondary | ICD-10-CM | POA: Diagnosis not present

## 2016-09-24 DIAGNOSIS — I96 Gangrene, not elsewhere classified: Secondary | ICD-10-CM | POA: Diagnosis not present

## 2016-09-24 DIAGNOSIS — R29898 Other symptoms and signs involving the musculoskeletal system: Secondary | ICD-10-CM | POA: Diagnosis not present

## 2016-09-24 DIAGNOSIS — R203 Hyperesthesia: Secondary | ICD-10-CM | POA: Diagnosis not present

## 2016-09-28 DIAGNOSIS — R0602 Shortness of breath: Secondary | ICD-10-CM | POA: Diagnosis not present

## 2016-09-28 DIAGNOSIS — Z79899 Other long term (current) drug therapy: Secondary | ICD-10-CM | POA: Diagnosis not present

## 2016-09-28 DIAGNOSIS — Z89022 Acquired absence of left finger(s): Secondary | ICD-10-CM | POA: Diagnosis not present

## 2016-09-28 DIAGNOSIS — I7300 Raynaud's syndrome without gangrene: Secondary | ICD-10-CM | POA: Diagnosis not present

## 2016-09-28 DIAGNOSIS — Z89021 Acquired absence of right finger(s): Secondary | ICD-10-CM | POA: Diagnosis not present

## 2016-09-28 DIAGNOSIS — M349 Systemic sclerosis, unspecified: Secondary | ICD-10-CM | POA: Diagnosis not present

## 2016-09-28 DIAGNOSIS — Z87891 Personal history of nicotine dependence: Secondary | ICD-10-CM | POA: Diagnosis not present

## 2016-09-28 DIAGNOSIS — R0609 Other forms of dyspnea: Secondary | ICD-10-CM | POA: Diagnosis not present

## 2016-09-28 DIAGNOSIS — I270 Primary pulmonary hypertension: Secondary | ICD-10-CM | POA: Diagnosis not present

## 2016-09-28 DIAGNOSIS — R1310 Dysphagia, unspecified: Secondary | ICD-10-CM | POA: Diagnosis not present

## 2016-10-08 DIAGNOSIS — R29898 Other symptoms and signs involving the musculoskeletal system: Secondary | ICD-10-CM | POA: Diagnosis not present

## 2016-10-08 DIAGNOSIS — R203 Hyperesthesia: Secondary | ICD-10-CM | POA: Diagnosis not present

## 2016-10-08 DIAGNOSIS — I96 Gangrene, not elsewhere classified: Secondary | ICD-10-CM | POA: Diagnosis not present

## 2016-10-08 DIAGNOSIS — M349 Systemic sclerosis, unspecified: Secondary | ICD-10-CM | POA: Diagnosis not present

## 2016-10-16 IMAGING — US IR US GUIDE VASC ACCESS RIGHT
1 series · 1 of 1 positions shown · non-contrast
Comparison: none

CLINICAL DATA: Collagen vascular disease, scleroderma, [place],
ischemic painful symptomatic right second and third digits with
early ulcerations.

[Series 1: ir (id) (id)/(id)/(id) · 1 of 1 slices shown]
[im 1/1]
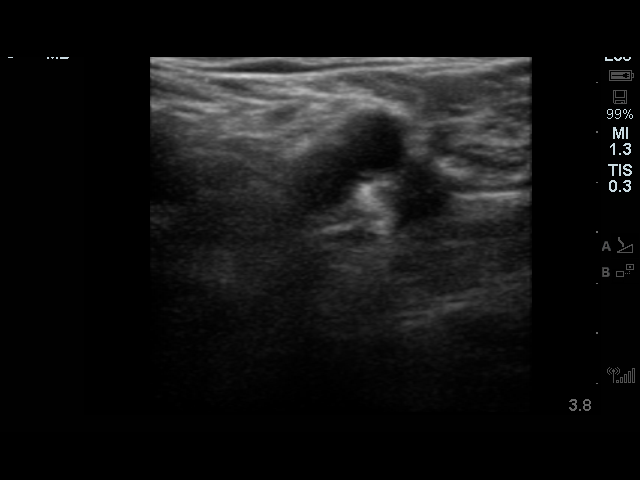

[1 of 1 positions shown; findings below may reference images not displayed]

EXAM:
ULTRASOUND GUIDANCE FOR VASCULAR ACCESS

CERVICAL THORACIC AORTOGRAM

RIGHT UPPER EXTREMITY BRACHIAL ARTERY CATHETERIZATION AND ANGIOGRAM

Radiologist:  Nv, David Freddy

Guidance:  Ultrasound fluoroscopic

FLUOROSCOPY TIME:  5 minutes 54 seconds, 28 mGy

MEDICATIONS AND MEDICAL HISTORY:
2 mg Versed, 75 mcg fentanyl

ANESTHESIA/SEDATION:
31 minutes

CONTRAST:  95 cc Visipaque 320

COMPLICATIONS:
None immediate

PROCEDURE:
Informed consent was obtained from the patient following explanation
of the procedure, risks, benefits and alternatives. The patient
understands, agrees and consents for the procedure. All questions
were addressed. A time out was performed.

Maximal barrier sterile technique utilized including caps, mask,
sterile gowns, sterile gloves, large sterile drape, hand hygiene,
and ChloraPrep.

Under sterile conditions and local anesthesia, ultrasound
micropuncture access performed of the right common femoral artery.
Guidewire inserted easily followed by 5 French sheath over a Bentson
guidewire. Pigtail catheter advanced into the ascending aorta.
Cervical thoracic aortogram performed.

Aortogram: Aorta is of normal caliber without significant
atherosclerotic plaque formation. No vascular abnormality or
dissection. Common origin of the brachiocephalic and left common
carotid arteries compatible with bovine arch anatomy. Left
subclavian artery is also patent. The visualized common carotids,
proximal vertebral arteries, and subclavian arteries are patent.

Catheter was exchanged for a 100 cm Polin catheter. Over a
Glidewire, the catheter was advanced peripherally into the right
brachial artery. Contrast injection confirms position. 100mcg
nitroglycerin instilled into the right upper extremity. Contrast
injection performed for a complete right upper extremity angiogram.

Right upper extremity: The right brachial artery has a high
bifurcation in the upper arm at the mid humerus level. The right
radial and ulnar arteries are patent across the elbow and throughout
the forearm to the wrist. Interosseous artery is patent in the
forearm.

At the wrist, the radial artery is patent across the wrist supplying
the superficial arch. The ulnar artery has a chronic abrupt
occlusion at the wrist.

Thumb:  The princeps pollicis artery to the thumb is patent.

Second digit: There is a patent common palmar digital artery to the
second digit. The second finger has disease of the proper digital
arteries. The second digit proper digital artery along the ulnar
aspect has arterial wall calcification at the MCP joint. The radial
side proper digital artery is diminutive in caliber and terminates
at the PIP joint. Small collateral vessels do supply the second
digit distal vascular tuft.

Third digit: The third finger has marked vascular compromise. There
is chronic complete occlusion of the proper digital arteries with no
vascular flow to the distal phalangeal tuft.

Fourth digit: The fourth finger has supply from 2 common palmar
digital arteries. Distal aspect of the fourth digit is supplied via
a proper digital artery along the radial side which eventually
supplies the distal vascular tuft. The ulnar side proper digital
artery tapers to occlusion proximally.

Fifth digit: The fifth finger has 2 common palmar digital arteries
supplying vascular flow. Minimal irregularity of the supplying
common digital arteries. The radial side proper digital artery to
the fifth digit supplies the distal vascular tuft. The ulnar side
proper digital artery tapers to occlusion at the DIP joint.

Catheter access removed. Hemostasis obtained with manual
compression. Patient tolerated the procedure well.
IMPRESSION: Right hand peripheral small vessel disease with chronic occlusion of
the right distal ulnar artery at the wrist and small vessel
occlusions of the proper digital arteries to the second through
fifth digits with the most pronounced vascular occlusive disease to
the third finger.

High bifurcation of the brachial artery, normal variant

No significant proximal vascular disease or embolic source.

## 2016-10-21 DIAGNOSIS — K222 Esophageal obstruction: Secondary | ICD-10-CM | POA: Diagnosis not present

## 2016-10-21 DIAGNOSIS — K219 Gastro-esophageal reflux disease without esophagitis: Secondary | ICD-10-CM | POA: Diagnosis not present

## 2016-10-21 DIAGNOSIS — K5900 Constipation, unspecified: Secondary | ICD-10-CM | POA: Diagnosis not present

## 2016-10-21 MED FILL — PANTOPRAZOLE SOD DR 40 MG T: 40 | 30 days supply | Qty: 30 | Fill #0

## 2016-10-21 MED FILL — CARAFATE 1 GM/10 ML SUSP: 1 | 30 days supply | Qty: 600 | Fill #0

## 2016-10-21 MED FILL — POLYETHYLENE GLYCOL 3350: 30 days supply | Qty: 1020 | Fill #0

## 2016-11-12 MED FILL — CELECOXIB 200 MG CAP: 200 | 30 days supply | Qty: 30 | Fill #0

## 2016-11-23 MED FILL — HYDROCODON-APAP 5-325: 5-325 | 8 days supply | Qty: 30 | Fill #0

## 2016-12-03 MED FILL — SILDENAFIL 20 MG TABLET: 20 | 10 days supply | Qty: 30 | Fill #0

## 2016-12-10 MED FILL — HYDROCODON-APAP 5-325: 5-325 | 7 days supply | Qty: 30 | Fill #0

## 2016-12-22 MED FILL — HYDROCODON-APAP 5-325: 5-325 | 7 days supply | Qty: 30 | Fill #0

## 2016-12-23 MED FILL — SILDENAFIL 20 MG TABLET: 20 | 30 days supply | Qty: 90 | Fill #1

## 2016-12-30 ENCOUNTER — Other Ambulatory Visit: Payer: Self-pay | Admitting: Neurology

## 2016-12-30 DIAGNOSIS — M349 Systemic sclerosis, unspecified: Secondary | ICD-10-CM

## 2016-12-30 DIAGNOSIS — I96 Gangrene, not elsewhere classified: Secondary | ICD-10-CM

## 2017-01-05 MED FILL — HYDROCODON-APAP 5-325: 5-325 | 23 days supply | Qty: 90 | Fill #0

## 2017-01-07 DIAGNOSIS — K3189 Other diseases of stomach and duodenum: Secondary | ICD-10-CM | POA: Diagnosis not present

## 2017-01-07 DIAGNOSIS — K219 Gastro-esophageal reflux disease without esophagitis: Secondary | ICD-10-CM | POA: Diagnosis not present

## 2017-01-07 DIAGNOSIS — K210 Gastro-esophageal reflux disease with esophagitis: Secondary | ICD-10-CM | POA: Diagnosis not present

## 2017-01-07 DIAGNOSIS — K222 Esophageal obstruction: Secondary | ICD-10-CM | POA: Diagnosis not present

## 2017-01-07 DIAGNOSIS — R1310 Dysphagia, unspecified: Secondary | ICD-10-CM | POA: Diagnosis not present

## 2017-01-07 DIAGNOSIS — K319 Disease of stomach and duodenum, unspecified: Secondary | ICD-10-CM | POA: Diagnosis not present

## 2017-01-07 DIAGNOSIS — K209 Esophagitis, unspecified: Secondary | ICD-10-CM | POA: Diagnosis not present

## 2017-01-07 DIAGNOSIS — K449 Diaphragmatic hernia without obstruction or gangrene: Secondary | ICD-10-CM | POA: Diagnosis not present

## 2017-01-07 DIAGNOSIS — K259 Gastric ulcer, unspecified as acute or chronic, without hemorrhage or perforation: Secondary | ICD-10-CM | POA: Diagnosis not present

## 2017-01-07 DIAGNOSIS — K2210 Ulcer of esophagus without bleeding: Secondary | ICD-10-CM | POA: Diagnosis not present

## 2017-01-14 MED FILL — CARAFATE 1 GM/10 ML SUSP: 1 | 21 days supply | Qty: 840 | Fill #0

## 2017-01-14 MED FILL — ESOMEPRAZOLE MAG DR 40 MG C: 40 | 56 days supply | Qty: 112 | Fill #0

## 2017-02-04 MED FILL — HYDROCODON-APAP 5-325: 5-325 | 23 days supply | Qty: 90 | Fill #0

## 2017-03-02 MED FILL — HYDROCODON-APAP 5-325: 5-325 | 23 days supply | Qty: 90 | Fill #0

## 2017-03-16 DIAGNOSIS — Z79891 Long term (current) use of opiate analgesic: Secondary | ICD-10-CM | POA: Diagnosis not present

## 2017-03-16 DIAGNOSIS — M349 Systemic sclerosis, unspecified: Secondary | ICD-10-CM | POA: Diagnosis not present

## 2017-03-16 MED FILL — SULFAMETHOXAZOLE/TMP DS TAB: 800-160 | 7 days supply | Qty: 14 | Fill #0

## 2017-03-17 MED FILL — HYDROCODON-APAP 7.5-325: 7.5-325 | 30 days supply | Qty: 120 | Fill #0

## 2017-03-25 MED FILL — MYCOPHENOLATE 500 MG TABLET: 500 | 30 days supply | Qty: 120 | Fill #0

## 2017-04-21 MED FILL — HYDROCODON-APAP 7.5-325: 7.5-325 | 30 days supply | Qty: 120 | Fill #0

## 2017-04-29 DIAGNOSIS — L609 Nail disorder, unspecified: Secondary | ICD-10-CM | POA: Diagnosis not present

## 2017-05-19 MED FILL — HYDROCODON-APAP 7.5-325: 7.5-325 | 30 days supply | Qty: 120 | Fill #0

## 2017-05-21 DIAGNOSIS — M341 CR(E)ST syndrome: Secondary | ICD-10-CM | POA: Diagnosis not present

## 2017-05-21 DIAGNOSIS — I998 Other disorder of circulatory system: Secondary | ICD-10-CM | POA: Diagnosis not present

## 2017-05-30 ENCOUNTER — Other Ambulatory Visit: Payer: Self-pay

## 2017-05-30 ENCOUNTER — Encounter (HOSPITAL_COMMUNITY): Payer: Self-pay | Admitting: *Deleted

## 2017-05-30 ENCOUNTER — Ambulatory Visit: Payer: Self-pay | Admitting: Orthopedic Surgery

## 2017-05-30 ENCOUNTER — Other Ambulatory Visit: Payer: Self-pay | Admitting: Orthopedic Surgery

## 2017-05-30 NOTE — Progress Notes (Signed)
Pt denies SOB, chest pain, and being under the care of a cardiologist. Pt denies having a stress test and cardiac cath. Pt denies having recent labs. Pt made aware to stop taking  vitamins, fish oil and herbal medications. Do not take any NSAIDs ie: Ibuprofen, Advil, Naproxen (Aleve), Motrin, BC and Goody Powder. Pt verbalized understanding of all pre-op instructions.

## 2017-05-31 ENCOUNTER — Ambulatory Visit (HOSPITAL_COMMUNITY): Payer: 59 | Admitting: Anesthesiology

## 2017-05-31 ENCOUNTER — Encounter (HOSPITAL_COMMUNITY)
Admission: RE | Disposition: A | Discharge status: Home / Self Care | Payer: Self-pay | Source: Ambulatory Visit | Attending: Orthopedic Surgery

## 2017-05-31 ENCOUNTER — Encounter (HOSPITAL_COMMUNITY): Payer: Self-pay | Admitting: *Deleted

## 2017-05-31 ENCOUNTER — Ambulatory Visit (HOSPITAL_COMMUNITY)
Admission: RE | Admit: 2017-05-31 | Discharge: 2017-05-31 | Disposition: A | Discharge status: Home / Self Care | Payer: 59 | Source: Ambulatory Visit | Attending: Orthopedic Surgery | Admitting: Orthopedic Surgery

## 2017-05-31 DIAGNOSIS — I798 Other disorders of arteries, arterioles and capillaries in diseases classified elsewhere: Secondary | ICD-10-CM | POA: Diagnosis not present

## 2017-05-31 DIAGNOSIS — I7300 Raynaud's syndrome without gangrene: Secondary | ICD-10-CM | POA: Diagnosis not present

## 2017-05-31 DIAGNOSIS — K219 Gastro-esophageal reflux disease without esophagitis: Secondary | ICD-10-CM | POA: Insufficient documentation

## 2017-05-31 DIAGNOSIS — I739 Peripheral vascular disease, unspecified: Secondary | ICD-10-CM | POA: Diagnosis not present

## 2017-05-31 DIAGNOSIS — Z87891 Personal history of nicotine dependence: Secondary | ICD-10-CM | POA: Insufficient documentation

## 2017-05-31 DIAGNOSIS — I10 Essential (primary) hypertension: Secondary | ICD-10-CM | POA: Diagnosis not present

## 2017-05-31 DIAGNOSIS — E538 Deficiency of other specified B group vitamins: Secondary | ICD-10-CM | POA: Insufficient documentation

## 2017-05-31 DIAGNOSIS — G63 Polyneuropathy in diseases classified elsewhere: Secondary | ICD-10-CM | POA: Insufficient documentation

## 2017-05-31 DIAGNOSIS — M349 Systemic sclerosis, unspecified: Secondary | ICD-10-CM | POA: Diagnosis not present

## 2017-05-31 DIAGNOSIS — Z89021 Acquired absence of right finger(s): Secondary | ICD-10-CM | POA: Insufficient documentation

## 2017-05-31 DIAGNOSIS — Z79899 Other long term (current) drug therapy: Secondary | ICD-10-CM | POA: Diagnosis not present

## 2017-05-31 DIAGNOSIS — K7010 Alcoholic hepatitis without ascites: Secondary | ICD-10-CM | POA: Diagnosis not present

## 2017-05-31 DIAGNOSIS — Z981 Arthrodesis status: Secondary | ICD-10-CM | POA: Diagnosis not present

## 2017-05-31 DIAGNOSIS — Z89022 Acquired absence of left finger(s): Secondary | ICD-10-CM | POA: Insufficient documentation

## 2017-05-31 DIAGNOSIS — M341 CR(E)ST syndrome: Secondary | ICD-10-CM | POA: Diagnosis not present

## 2017-05-31 DIAGNOSIS — K589 Irritable bowel syndrome without diarrhea: Secondary | ICD-10-CM | POA: Insufficient documentation

## 2017-05-31 DIAGNOSIS — M869 Osteomyelitis, unspecified: Secondary | ICD-10-CM | POA: Diagnosis not present

## 2017-05-31 DIAGNOSIS — I998 Other disorder of circulatory system: Secondary | ICD-10-CM | POA: Insufficient documentation

## 2017-05-31 HISTORY — PX: AMPUTATION: SHX166

## 2017-05-31 LAB — BASIC METABOLIC PANEL
Anion gap: 16 — ABNORMAL HIGH (ref 5–15)
BUN: 9 mg/dL (ref 6–20)
CHLORIDE: 99 mmol/L — AB (ref 101–111)
CO2: 23 mmol/L (ref 22–32)
CREATININE: 0.65 mg/dL (ref 0.44–1.00)
Calcium: 9.6 mg/dL (ref 8.9–10.3)
GFR calc Af Amer: >60 mL/min (ref >60)
GFR calc non Af Amer: >60 mL/min (ref >60)
GLUCOSE: 82 mg/dL (ref 65–99)
POTASSIUM: 4.6 mmol/L (ref 3.5–5.1)
SODIUM: 138 mmol/L (ref 135–145)

## 2017-05-31 LAB — CBC
HEMATOCRIT: 38.1 % (ref 36.0–46.0)
Hemoglobin: 13.2 g/dL (ref 12.0–15.0)
MCH: 35.3 pg — AB (ref 26.0–34.0)
MCHC: 34.6 g/dL (ref 30.0–36.0)
MCV: 101.9 fL — AB (ref 78.0–100.0)
PLATELETS: 249 10*3/uL (ref 150–400)
RBC: 3.74 MIL/uL — ABNORMAL LOW (ref 3.87–5.11)
RDW: 13.2 % (ref 11.5–15.5)
WBC: 11.5 10*3/uL — AB (ref 4.0–10.5)

## 2017-05-31 SURGERY — AMPUTATION DIGIT
Anesthesia: General | Site: Finger | Laterality: Right

## 2017-05-31 MED ORDER — LIDOCAINE 2% (20 MG/ML) 5 ML SYRINGE
INTRAMUSCULAR | Status: AC
  Filled 2017-05-31: qty 5

## 2017-05-31 MED ORDER — 0.9 % SODIUM CHLORIDE (POUR BTL) OPTIME
TOPICAL | Status: DC | PRN
  Administered 2017-05-31: 1000 mL

## 2017-05-31 MED ORDER — CEFAZOLIN SODIUM-DEXTROSE 2-4 GM/100ML-% IV SOLN
2.0000 g | INTRAVENOUS | Status: AC
  Administered 2017-05-31: 2 g via INTRAVENOUS
  Filled 2017-05-31: qty 100

## 2017-05-31 MED ORDER — PHENYLEPHRINE 40 MCG/ML (10ML) SYRINGE FOR IV PUSH (FOR BLOOD PRESSURE SUPPORT)
PREFILLED_SYRINGE | INTRAVENOUS | Status: DC | PRN
  Administered 2017-05-31 (×2): 80 ug via INTRAVENOUS

## 2017-05-31 MED ORDER — DEXAMETHASONE SODIUM PHOSPHATE 10 MG/ML IJ SOLN
INTRAMUSCULAR | Status: DC | PRN
  Administered 2017-05-31: 10 mg via INTRAVENOUS

## 2017-05-31 MED ORDER — FENTANYL CITRATE (PF) 250 MCG/5ML IJ SOLN
INTRAMUSCULAR | Status: DC | PRN
  Administered 2017-05-31 (×3): 50 ug via INTRAVENOUS

## 2017-05-31 MED ORDER — PHENYLEPHRINE HCL 10 MG/ML IJ SOLN
INTRAMUSCULAR | Status: DC | PRN
  Administered 2017-05-31: 120 ug via INTRAVENOUS

## 2017-05-31 MED ORDER — PROPOFOL 10 MG/ML IV BOLUS
INTRAVENOUS | Status: DC | PRN
  Administered 2017-05-31: 160 mg via INTRAVENOUS
  Administered 2017-05-31: 20 mg via INTRAVENOUS

## 2017-05-31 MED ORDER — DEXAMETHASONE SODIUM PHOSPHATE 10 MG/ML IJ SOLN
INTRAMUSCULAR | Status: AC
  Filled 2017-05-31: qty 1

## 2017-05-31 MED ORDER — LACTATED RINGERS IV SOLN
INTRAVENOUS | Status: DC | PRN
  Administered 2017-05-31 (×2): via INTRAVENOUS

## 2017-05-31 MED ORDER — MIDAZOLAM HCL 2 MG/2ML IJ SOLN
INTRAMUSCULAR | Status: AC
  Filled 2017-05-31: qty 2

## 2017-05-31 MED ORDER — PROPOFOL 10 MG/ML IV BOLUS
INTRAVENOUS | Status: AC
  Filled 2017-05-31: qty 20

## 2017-05-31 MED ORDER — BUPIVACAINE HCL (PF) 0.5 % IJ SOLN
INTRAMUSCULAR | Status: AC
  Filled 2017-05-31: qty 30

## 2017-05-31 MED ORDER — FENTANYL CITRATE (PF) 250 MCG/5ML IJ SOLN
INTRAMUSCULAR | Status: AC
  Filled 2017-05-31: qty 5

## 2017-05-31 MED ORDER — BUPIVACAINE HCL (PF) 0.25 % IJ SOLN
INTRAMUSCULAR | Status: DC | PRN
  Administered 2017-05-31: 6 mL

## 2017-05-31 MED ORDER — MIDAZOLAM HCL 2 MG/2ML IJ SOLN
INTRAMUSCULAR | Status: DC | PRN
  Administered 2017-05-31: 2 mg via INTRAVENOUS

## 2017-05-31 MED ORDER — ONDANSETRON HCL 4 MG/2ML IJ SOLN
INTRAMUSCULAR | Status: AC
  Filled 2017-05-31: qty 2

## 2017-05-31 MED ORDER — ONDANSETRON HCL 4 MG/2ML IJ SOLN
INTRAMUSCULAR | Status: DC | PRN
  Administered 2017-05-31: 4 mg via INTRAVENOUS

## 2017-05-31 MED ORDER — BUPIVACAINE-EPINEPHRINE (PF) 0.5% -1:200000 IJ SOLN
INTRAMUSCULAR | Status: AC
  Filled 2017-05-31: qty 30

## 2017-05-31 MED ORDER — CHLORHEXIDINE GLUCONATE 4 % EX LIQD: 60.0000 mL | Freq: Once | CUTANEOUS | Status: DC

## 2017-05-31 MED ORDER — LIDOCAINE 2% (20 MG/ML) 5 ML SYRINGE
INTRAMUSCULAR | Status: DC | PRN
  Administered 2017-05-31: 40 mg via INTRAVENOUS

## 2017-05-31 MED ORDER — BUPIVACAINE HCL (PF) 0.25 % IJ SOLN
INTRAMUSCULAR | Status: AC
  Filled 2017-05-31: qty 10

## 2017-05-31 MED ORDER — LACTATED RINGERS IV SOLN
INTRAVENOUS | Status: DC
  Administered 2017-05-31: 15:00:00 via INTRAVENOUS

## 2017-05-31 SURGICAL SUPPLY — 44 items
BANDAGE ACE 3X5.8 VEL STRL LF (GAUZE/BANDAGES/DRESSINGS) IMPLANT
BANDAGE ACE 4X5 VEL STRL LF (GAUZE/BANDAGES/DRESSINGS) IMPLANT
BANDAGE COBAN STERILE 2 (GAUZE/BANDAGES/DRESSINGS) ×2 IMPLANT
BNDG COHESIVE 1X5 TAN STRL LF (GAUZE/BANDAGES/DRESSINGS) ×2 IMPLANT
BNDG CONFORM 2 STRL LF (GAUZE/BANDAGES/DRESSINGS) ×4 IMPLANT
BNDG GAUZE ELAST 4 BULKY (GAUZE/BANDAGES/DRESSINGS) ×4 IMPLANT
CORDS BIPOLAR (ELECTRODE) ×2 IMPLANT
COVER SURGICAL LIGHT HANDLE (MISCELLANEOUS) ×2 IMPLANT
CUFF TOURNIQUET SINGLE 18IN (TOURNIQUET CUFF) ×2 IMPLANT
CUFF TOURNIQUET SINGLE 24IN (TOURNIQUET CUFF) IMPLANT
DRAPE SURG 17X23 STRL (DRAPES) ×2 IMPLANT
DRSG MEPITEL 4X7.2 (GAUZE/BANDAGES/DRESSINGS) ×2 IMPLANT
GAUZE SPONGE 2X2 8PLY STRL LF (GAUZE/BANDAGES/DRESSINGS) ×1 IMPLANT
GAUZE SPONGE 4X4 12PLY STRL (GAUZE/BANDAGES/DRESSINGS) ×2 IMPLANT
GAUZE XEROFORM 1X8 LF (GAUZE/BANDAGES/DRESSINGS) ×2 IMPLANT
GLOVE BIOGEL M 8.0 STRL (GLOVE) ×2 IMPLANT
GLOVE SS BIOGEL STRL SZ 8 (GLOVE) ×1 IMPLANT
GLOVE SUPERSENSE BIOGEL SZ 8 (GLOVE) ×1
GOWN STRL REUS W/ TWL LRG LVL3 (GOWN DISPOSABLE) ×1 IMPLANT
GOWN STRL REUS W/ TWL XL LVL3 (GOWN DISPOSABLE) ×2 IMPLANT
GOWN STRL REUS W/TWL LRG LVL3 (GOWN DISPOSABLE) ×1
GOWN STRL REUS W/TWL XL LVL3 (GOWN DISPOSABLE) ×2
KIT BASIN OR (CUSTOM PROCEDURE TRAY) ×2 IMPLANT
KIT ROOM TURNOVER OR (KITS) ×2 IMPLANT
MANIFOLD NEPTUNE II (INSTRUMENTS) ×2 IMPLANT
NEEDLE HYPO 25GX1X1/2 BEV (NEEDLE) IMPLANT
NS IRRIG 1000ML POUR BTL (IV SOLUTION) ×2 IMPLANT
PACK ORTHO EXTREMITY (CUSTOM PROCEDURE TRAY) ×2 IMPLANT
PAD ARMBOARD 7.5X6 YLW CONV (MISCELLANEOUS) ×4 IMPLANT
PAD CAST 4YDX4 CTTN HI CHSV (CAST SUPPLIES) IMPLANT
PADDING CAST COTTON 4X4 STRL (CAST SUPPLIES)
SCRUB BETADINE 4OZ XXX (MISCELLANEOUS) ×2 IMPLANT
SOL PREP POV-IOD 4OZ 10% (MISCELLANEOUS) ×4 IMPLANT
SPECIMEN JAR SMALL (MISCELLANEOUS) IMPLANT
SPONGE GAUZE 2X2 STER 10/PKG (GAUZE/BANDAGES/DRESSINGS) ×1
SUT MERSILENE 4 0 P 3 (SUTURE) IMPLANT
SUT PROLENE 4 0 PS 2 18 (SUTURE) IMPLANT
SUT PROLENE 5 0 PS 2 (SUTURE) ×4 IMPLANT
SYR CONTROL 10ML LL (SYRINGE) IMPLANT
TOWEL OR 17X24 6PK STRL BLUE (TOWEL DISPOSABLE) IMPLANT
TOWEL OR 17X26 10 PK STRL BLUE (TOWEL DISPOSABLE) ×2 IMPLANT
TUBE CONNECTING 12X1/4 (SUCTIONS) IMPLANT
UNDERPAD 30X30 (UNDERPADS AND DIAPERS) ×2 IMPLANT
WATER STERILE IRR 1000ML POUR (IV SOLUTION) IMPLANT

## 2017-05-31 NOTE — Anesthesia Preprocedure Evaluation (Signed)
Anesthesia Evaluation  Patient identified by MRN, date of birth, ID band Patient awake    Reviewed: Allergy & Precautions, NPO status , Patient's Chart, lab work & pertinent test results  History of Anesthesia Complications Negative for: history of anesthetic complications  Airway Mallampati: III  TM Distance: >3 FB Neck ROM: Full    Dental  (+) Teeth Intact   Pulmonary neg shortness of breath, neg COPD, neg recent URI, former smoker,    breath sounds clear to auscultation       Cardiovascular hypertension, Pt. on medications + Peripheral Vascular Disease   Rhythm:Regular     Neuro/Psych negative neurological ROS  negative psych ROS   GI/Hepatic GERD  Medicated and Controlled,(+) Hepatitis -  Endo/Other  negative endocrine ROS  Renal/GU negative Renal ROS     Musculoskeletal   Abdominal   Peds  Hematology negative hematology ROS (+)   Anesthesia Other Findings   Reproductive/Obstetrics                             Anesthesia Physical Anesthesia Plan  ASA: II  Anesthesia Plan: General   Post-op Pain Management:    Induction: Intravenous  PONV Risk Score and Plan: 3 and Ondansetron and Dexamethasone  Airway Management Planned: LMA  Additional Equipment: None  Intra-op Plan:   Post-operative Plan: Extubation in OR  Informed Consent: I have reviewed the patients History and Physical, chart, labs and discussed the procedure including the risks, benefits and alternatives for the proposed anesthesia with the patient or authorized representative who has indicated his/her understanding and acceptance.   Dental advisory given  Plan Discussed with: CRNA and Surgeon  Anesthesia Plan Comments:         Anesthesia Quick Evaluation

## 2017-05-31 NOTE — Op Note (Signed)
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

## 2017-05-31 NOTE — H&P (Signed)
Lisa Long is an 52 y.o. female.   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 She is previously underwent bilateral index finger amputations under the supervision of Dr. Fredna Dow . We have had the opportunity to see and evaluate her in our office setting and discussed with her all options. She would like to proceed with amputation of the right middle finger at the DIP versus the PIP pending intraoperative findings. We've discussed all issues with her at length.  Past Medical History:  Diagnosis Date  . Cervical spondylosis without myelopathy   . CREST (calcinosis, Raynaud's phenomenon, esophageal dysfunction, sclerodactyly, telangiectasia) ([place])   . Esophageal stricture - GERD 07/23/2014  . GERD (gastroesophageal reflux disease)   . Hx of adenomatous polyp of colon 10/14/2014  . Hypertension   . IBS (irritable bowel syndrome)   . Raynaud's disease, idiopathic   . Seasonal allergies   . Spinal stenosis   . Tobacco use   . Vitamin B12 deficiency     Past Surgical History:  Procedure Laterality Date  . AMPUTATION Bilateral 08/10/2016   Procedure: AMPUTATION BILATERAL INDEX FINGERS;  Surgeon: [name], MD;  Location: [facility];  Service: Orthopedics;  Laterality: Bilateral;  . BREAST SURGERY     benign breast cysts  . CERVICAL FUSION    . COLONOSCOPY    . DERMOID CYST  EXCISION    . 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 HEMORRHOID SURGERY    . IR GENERIC HISTORICAL  01/29/2016   IR ANGIOGRAM EXTREMITY LEFT 01/29/2016 [name], MD [facility]  . IR GENERIC HISTORICAL  01/29/2016   IR US GUIDE VASC ACCESS RIGHT 01/29/2016 [name], MD [facility]  . NAILBED REPAIR Right 01/30/2013   Procedure: BIOPSY OF RIGHT LONG NAIL FOLD;  Surgeon: [name]., MD;  Location: [facility];  Service: Orthopedics;  Laterality: Right;  Penrose used as tourniquet--on at 0944, off at 1001.  . OOPHORECTOMY     Right ovary removal only  . SYMPATHECTOMY Right 01/30/2015   Procedure: DIGITAL SYMPATHECTOMY, RIGHT RADIAL ARTERY, ULNAR ARTERY, ARCH DIGITAL VESSELS INDEX MIDDLE RING SMALL GRAFT ULNAR ARTERY ;  Surgeon: [name], MD;  Location: [facility];  Service: Orthopedics;  Laterality: Right;  . SYMPATHECTOMY Left 03/04/2016   Procedure: Digital SYMPATHECTOMY, left radial, ulnar, arch and common digital times 3;  Surgeon: [name], MD;  Location: [facility];  Service: Orthopedics;  Laterality: Left;  Digital SYMPATHECTOMY, left radial, ulnar, arch and common digital times 3    Family History  Problem Relation Age of Onset  . ALS Mother   . Diabetes Other   . Hypertension Other   . Stroke Other        F 1st degree relative <60   Social History:  reports that she has quit smoking. Her smoking use included cigarettes. She has a 12.50 pack-year smoking history. she has never used smokeless tobacco. She reports that she drinks about 12.0 oz of alcohol per week. She reports that she does not use drugs.  Allergies: No Known Allergies  Medications Prior to Admission  Medication Sig Dispense Refill  . acetaminophen (TYLENOL) 325 MG tablet Take 325-650 mg by mouth 2 (two) times daily as needed (  for pain.).    [name] amLODipine (NORVASC) 10 MG tablet Take 10 mg by mouth daily.    [name] HYDROcodone-acetaminophen (NORCO) 7.5-325 MG tablet Take 1 tablet by mouth every 6 (six) hours as needed. for pain  0  . naproxen sodium (ALEVE) 220 MG tablet Take 220 mg by mouth daily as needed (for pain.).      Results for orders placed or performed during the hospital encounter of 05/31/17 (from the past 48 hour(s))  Basic metabolic panel     Status: Abnormal   Collection Time: 05/31/17  2:58 PM  Result Value Ref Range    Sodium 138 135 - 145 mmol/L   Potassium 4.6 3.5 - 5.1 mmol/L    Comment: SPECIMEN HEMOLYZED. HEMOLYSIS MAY AFFECT INTEGRITY OF RESULTS.   Chloride 99 (L) 101 - 111 mmol/L   CO2 23 22 - 32 mmol/L   Glucose, Bld 82 65 - 99 mg/dL   BUN 9 6 - 20 mg/dL   Creatinine, Ser 0.65 0.44 - 1.00 mg/dL   Calcium 9.6 8.9 - 10.3 mg/dL   GFR calc non Af Amer >60 >60 mL/min   GFR calc Af Amer >60 >60 mL/min    Comment: (NOTE) The eGFR has been calculated using the CKD EPI equation. This calculation has not been validated in all clinical situations. eGFR's persistently <60 mL/min signify possible Chronic Kidney Disease.    Anion gap 16 (H) 5 - 15    Comment: Performed at [facility], [place] [address], [place], [place] [address]  CBC     Status: Abnormal   Collection Time: 05/31/17  2:58 PM  Result Value Ref Range   WBC 11.5 (H) 4.0 - 10.5 K/uL   RBC 3.74 (L) 3.87 - 5.11 MIL/uL   Hemoglobin 13.2 12.0 - 15.0 g/dL   HCT 38.1 36.0 - 46.0 %   MCV 101.9 (H) 78.0 - 100.0 fL   MCH 35.3 (H) 26.0 - 34.0 pg   MCHC 34.6 30.0 - 36.0 g/dL   RDW 13.2 11.5 - 15.5 %   Platelets 249 150 - 400 K/uL    Comment: Performed at [facility] [address] [place], [place] [address]   No results found.  Review of Systems  Constitutional: Negative.   HENT: Negative.   Eyes: Negative.   Respiratory: Negative.   Cardiovascular: Negative.   Musculoskeletal:       See history of present illness  Skin: Negative.       Blood pressure 110/78, pulse 93, temperature 98 F (36.7 C), temperature source Oral, resp. rate 20, height _0  (1.727 m), weight 49.9 kg (110 lb), last menstrual period 01/28/2010, SpO2 100 %.   Physical Exam  The patient is alert and oriented in no acute distress. The patient complains of pain in the affected upper extremity.  The patient is noted to have a normal HEENT exam. Lung fields show equal chest expansion and no shortness of breath. Abdomen exam is nontender without  distention. Lower extremity examination does not show any fracture dislocation or blood clot symptoms. Pelvis is stable and the neck and back are stable and nontender. Evaluation of the right hand, right middle finger demonstrates changes as consistent with dry gangrene. She has no signs of infection or cellulitic changes present.  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 Abnormal PFTs (pulmonary function tests) 01/24/2015  . Scleroderma ([place])   . Hx  of adenomatous polyp of colon 10/14/2014  . Visit for screening mammogram 07/23/2014  . Routine general medical examination at a health care facility 07/23/2014  . Esophageal stricture - GERD 07/23/2014  . Raynaud's disease, idiopathic 01/10/2014  . Cervical spondylosis without myelopathy 03/07/2013  . Alcoholic hepatitis 17/79/3903  . 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 IBS (irritable bowel syndrome) 02/24/2011  . TOBACCO USE 12/31/2009  . GERD 12/31/2009  . Hyperglycemia 03/14/2008  . Vitamin B12 deficiency neuropathy ([place]) 02/21/2008  . Essential hypertension, benign 02/12/2008  We are planning surgery for your upper extremity. The risk and benefits of surgery to include risk of bleeding, infection, anesthesia,  damage to normal structures and failure of the surgery to accomplish its intended goals of relieving symptoms and restoring function have been discussed in detail. With this in mind we plan to proceed. I have specifically discussed with the patient the pre-and postoperative regime and the dos and don'ts and risk and benefits in great detail. Risk and benefits of surgery also include risk of dystrophy(CRPS), chronic nerve pain, failure of the healing process to go onto completion and other inherent risks of surgery The relavent the pathophysiology of the disease/injury process, as well as the alternatives for treatment and postoperative course of action has  been discussed in great detail with the patient who desires to proceed.  We will do everything in our power to help you (the patient) restore function to the upper extremity. It is a pleasure to see this patient today.   Dragon Thrush L, PA-C 05/31/2017, 4:57 PM

## 2017-05-31 NOTE — Discharge Instructions (Signed)
Please keep your bandage clean and dry  Please call for any problems.    You may resume all of your home medicine.  Your operation went very well.  Please keep the extremity elevated and avoid excessive cold exposure  Keep bandage clean and dry.  Call for any problems.  No smoking.  Criteria for driving a car: you should be off your pain medicine for 7-8 hours, able to drive one handed(confident), thinking clearly and feeling able in your judgement to drive. Continue elevation as it will decrease swelling.  If instructed by MD move your fingers within the confines of the bandage/splint.  Use ice if instructed by your MD. Call immediately for any sudden loss of feeling in your hand/arm or change in functional abilities of the extremity.  We recommend that you to take vitamin C 1000 mg a day to promote healing. We also recommend that if you require  pain medicine that you take a stool softener to prevent constipation as most pain medicines will have constipation side effects. We recommend either Peri-Colace or Senokot and recommend that you also consider adding MiraLAX as well to prevent the constipation affects from pain medicine if you are required to use them. These medicines are over the counter and may be purchased at a local pharmacy. A cup of yogurt and a probiotic can also be helpful during the recovery process as the medicines can disrupt your intestinal environment.

## 2017-05-31 NOTE — Anesthesia Procedure Notes (Signed)
Procedure Name: LMA Insertion Date/Time: 05/31/2017 5:28 PM Performed by: Bryson Corona, CRNA Pre-anesthesia Checklist: Patient identified, Emergency Drugs available, Suction available and Patient being monitored Patient Re-evaluated:Patient Re-evaluated prior to induction Oxygen Delivery Method: Circle System Utilized Preoxygenation: Pre-oxygenation with 100% oxygen Induction Type: IV induction Ventilation: Mask ventilation without difficulty LMA: LMA inserted LMA Size: 4.0 Number of attempts: 1 Placement Confirmation: positive ETCO2 Tube secured with: Tape Dental Injury: Teeth and Oropharynx as per pre-operative assessment

## 2017-05-31 NOTE — Transfer of Care (Signed)
Immediate Anesthesia Transfer of Care Note  Patient: Lisa Long  Procedure(s) Performed: Right middle finger amputation/revision amputation at distal interphalangeal versus proximal interphalangeal with repair reconstruction as needed (Right Finger)  Patient Location: PACU  Anesthesia Type:General  Level of Consciousness: awake, alert  and oriented  Airway & Oxygen Therapy: Patient Spontanous Breathing and Patient connected to nasal cannula oxygen  Post-op Assessment: Report given to RN and Post -op Vital signs reviewed and stable  Post vital signs: Reviewed and stable  Last Vitals:  Vitals:   05/31/17 1459 05/31/17 1823  BP: 110/78   Pulse: 93   Resp: 20   Temp: 36.7 C 36.5 C  SpO2: 100%     Last Pain:  Vitals:   05/31/17 1823  TempSrc:   PainSc: (P) 0-No pain      Patients Stated Pain Goal: 3 (14/38/88 7579)  Complications: No apparent anesthesia complications

## 2017-06-01 ENCOUNTER — Encounter (HOSPITAL_COMMUNITY): Payer: Self-pay | Admitting: Orthopedic Surgery

## 2017-06-01 NOTE — Anesthesia Postprocedure Evaluation (Signed)
Anesthesia Post Note  Patient: Lisa Long  Procedure(s) Performed: Right middle finger amputation/revision amputation at distal interphalangeal versus proximal interphalangeal with repair reconstruction as needed (Right Finger)     Patient location during evaluation: PACU Anesthesia Type: General Level of consciousness: awake and alert Pain management: pain level controlled Vital Signs Assessment: post-procedure vital signs reviewed and stable Respiratory status: spontaneous breathing, nonlabored ventilation, respiratory function stable and patient connected to nasal cannula oxygen Cardiovascular status: blood pressure returned to baseline and stable Postop Assessment: no apparent nausea or vomiting Anesthetic complications: no    Last Vitals:  Vitals:   05/31/17 1838 05/31/17 1850  BP: 122/82   Pulse: 92   Resp: 11   Temp:  36.5 C  SpO2: 98%     Last Pain:  Vitals:   05/31/17 1850  TempSrc:   PainSc: 0-No pain                 Boleslaus Holloway

## 2017-06-01 NOTE — Op Note (Signed)
NAMEMarland Kitchen  REE, ALCALDE NO.:  [id number]  MEDICAL RECORD NO.:  [id number]  LOCATION:                                 FACILITY:  PHYSICIAN:  Satira Anis. Amedeo Plenty, M.D.     DATE OF BIRTH:  DATE OF PROCEDURE:  05/31/2017 DATE OF DISCHARGE:                              OPERATIVE REPORT   PREOPERATIVE DIAGNOSES: 1. 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 2. History of CREST syndrome.  POSTOPERATIVE DIAGNOSES: 1. 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 2. History of CREST syndrome.  PROCEDURE:  Right middle finger amputation of distal tip with bilateral neurectomies and direct primary closure at a distal interphalangeal joint level.  SURGEON:  Satira Anis. Amedeo Plenty, MD.  ASSISTANT:  Avelina Laine, PA-C.  COMPLICATIONS:  None.  ANESTHESIA:  General.  TOURNIQUET TIME:  Less than an hour.  INDICATIONS FOR THE PROCEDURE:  This patient is a very pleasant 52 year old female with the above-mentioned diagnosis.  I have counseled her in regard to risks and benefits of surgery and she desires to proceed with the above-mentioned operative intervention.  I have discussed her care preoperatively extensively as my notes would reflect.  I feel this is the best decision for her given the chronic pain, ischemia, and minimal function with exposed distal phalanx bone and signs of osteomyelitis.  OPERATIVE PROCEDURE:  The patient was seen by myself and Anesthesia, taken to the operative theater and underwent smooth induction of general anesthetic.  She was prepped with a Hibiclens pre-scrub followed by a 10- minute surgical Betadine scrub and paint followed by time-out being observed.  Preoperative antibiotics were given.  Once this was complete, she had a fishmouth  incision made.  Dissection was carried down.  The trifurcation was identified on the radial and ulnar sides and hemostats placed around the trifurcation region.  I then identified the flexor tendon and fishmouth the dorsal rim of the incision.  I looked at the terminal extensor tendon and severed this dorsally.  I then folded this back and turned attention volarly.  I performed a flexor digitorum profundus tenotomy, excision of the volar plate and the middle phalanx condylar head looked excellent in terms of its capabilities and without signs of infection.  I removed the distal phalanx with eroded tissue and ischemia and then performed a very careful and cautious irrigation process.  Once this was complete, we deflated the tourniquet, identified that she had good refill and findings suitable for primary repair of the skin and likely good healing.  This was closed with 5-0 Prolene. Sensorcaine was placed in the form of a flexor tendon sheath block and there were no complicating features.  Following this, we dressed her in a sterile bulky bandage.  There were no issues, complications, or other problems.  She will be monitored in the recovery room and discharged home.  I will see her back in 2-3 weeks for followup exam.  It  is a pleasure to see her today and participate in her care plan.     Satira Anis. Amedeo Plenty, M.D.     [facility]  D:  05/31/2017  T:  05/31/2017  Job:  [id number]

## 2017-06-16 MED FILL — HYDROCODON-APAP 7.5-325: 7.5-325 | 30 days supply | Qty: 120 | Fill #0

## 2017-06-24 MED FILL — CEPHALEXIN 500 MG CAPSULE: 500 | 7 days supply | Qty: 28 | Fill #0

## 2017-06-24 MED FILL — TEMAZEPAM 30 MG CAPSULE: 30 | 30 days supply | Qty: 30 | Fill #0

## 2017-07-06 MED FILL — ONDANSETRON HCL 8 MG TABLET: 8 | 10 days supply | Qty: 30 | Fill #0

## 2017-07-14 MED FILL — HYDROCODON-APAP 7.5-325: 7.5-325 | 30 days supply | Qty: 120 | Fill #0

## 2017-07-20 DIAGNOSIS — K219 Gastro-esophageal reflux disease without esophagitis: Secondary | ICD-10-CM | POA: Diagnosis not present

## 2017-07-20 DIAGNOSIS — Z79899 Other long term (current) drug therapy: Secondary | ICD-10-CM | POA: Diagnosis not present

## 2017-07-20 DIAGNOSIS — M79641 Pain in right hand: Secondary | ICD-10-CM | POA: Diagnosis not present

## 2017-07-20 DIAGNOSIS — M349 Systemic sclerosis, unspecified: Secondary | ICD-10-CM | POA: Diagnosis not present

## 2017-07-20 DIAGNOSIS — Z79891 Long term (current) use of opiate analgesic: Secondary | ICD-10-CM | POA: Diagnosis not present

## 2017-07-20 DIAGNOSIS — I7300 Raynaud's syndrome without gangrene: Secondary | ICD-10-CM | POA: Diagnosis not present

## 2017-07-20 MED FILL — PANTOPRAZOLE SOD DR 40 MG T: 40 | 30 days supply | Qty: 30 | Fill #0

## 2017-08-15 MED FILL — PANTOPRAZOLE SOD DR 40 MG T: 40 | 30 days supply | Qty: 30 | Fill #1

## 2017-08-16 MED FILL — HYDROCODON-APAP 7.5-325: 7.5-325 | 30 days supply | Qty: 120 | Fill #0

## 2017-09-14 MED FILL — HYDROCODON-APAP 7.5-325: 7.5-325 | 20 days supply | Qty: 80 | Fill #0

## 2017-10-03 DIAGNOSIS — Z5181 Encounter for therapeutic drug level monitoring: Secondary | ICD-10-CM | POA: Diagnosis not present

## 2017-10-03 DIAGNOSIS — Z79891 Long term (current) use of opiate analgesic: Secondary | ICD-10-CM | POA: Diagnosis not present

## 2017-10-03 DIAGNOSIS — G8929 Other chronic pain: Secondary | ICD-10-CM | POA: Diagnosis not present

## 2017-10-17 MED FILL — PANTOPRAZOLE SOD DR 40 MG T: 40 | 30 days supply | Qty: 30 | Fill #2

## 2017-11-06 IMAGING — XA IR US GUIDE VASC ACCESS RIGHT
1 series · 13 of 24 positions shown · IV contrast (IODINE)
Comparison: none

INDICATION: 50-year-old female with scleroderma and Delvon disease. Patient
complains of severe left hand pain. Evidence for ischemia at the tip
of the left index finger.

[Series 300: dsa extremities · 13 of 71 slices shown]
[im 1/71]
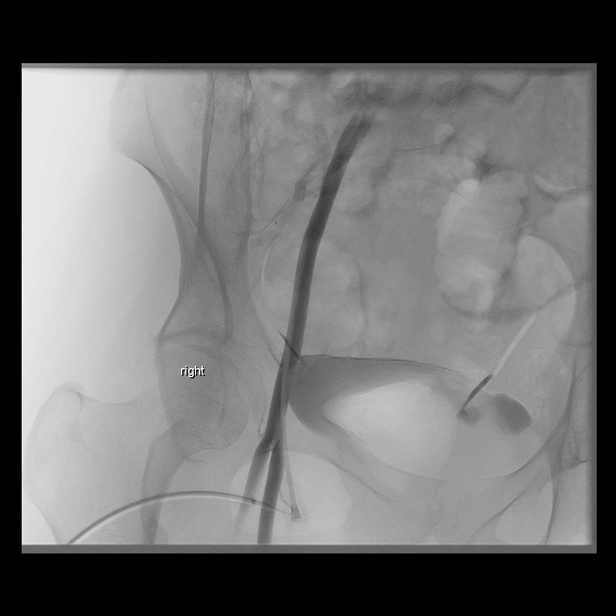
[im 7/71]
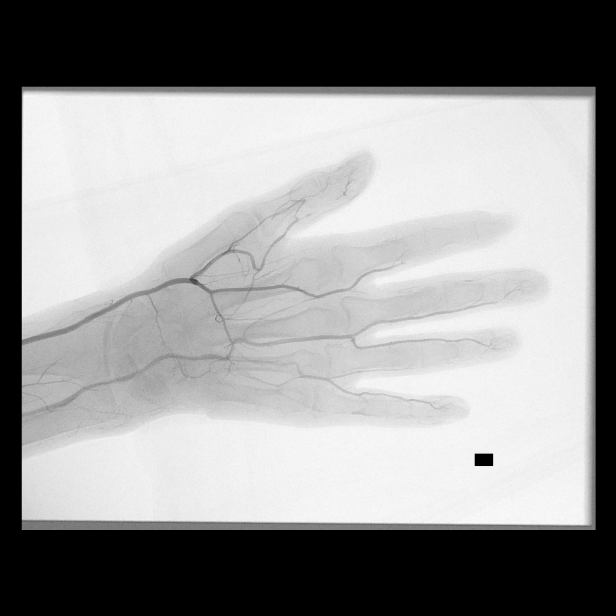
[im 13/71]
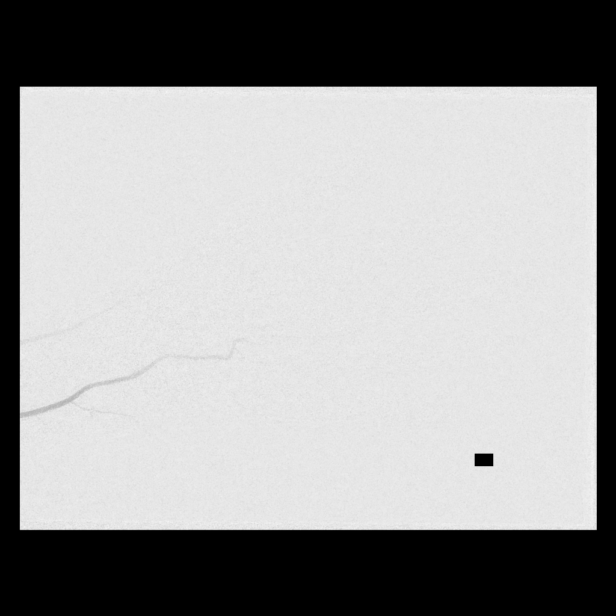
[im 19/71]
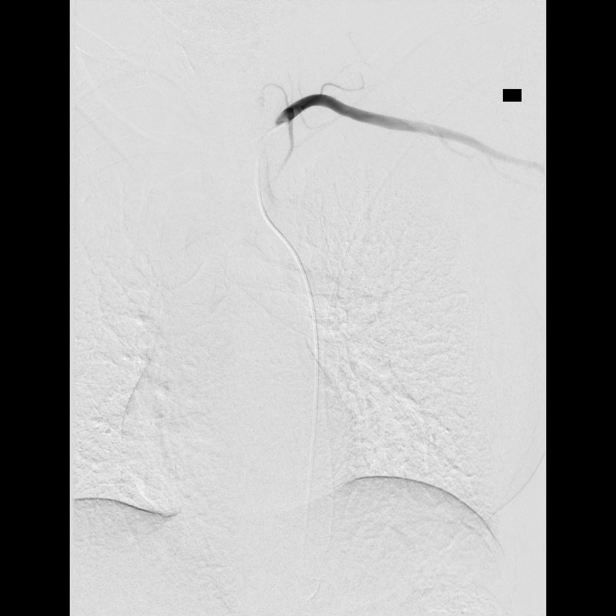
[im 25/71]
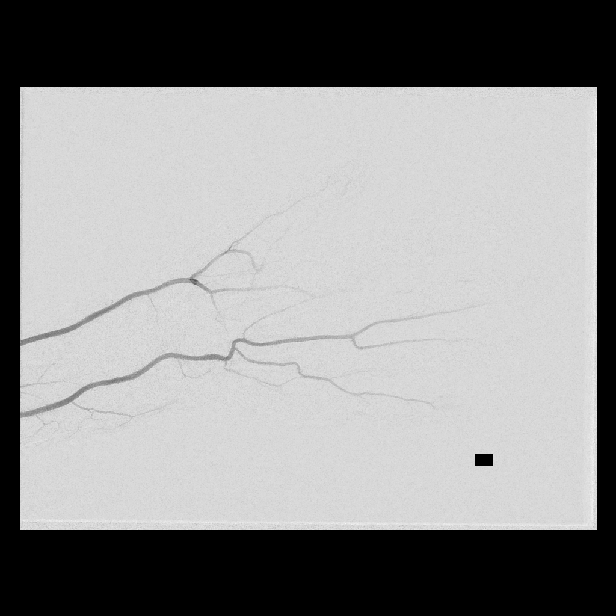
[im 31/71]
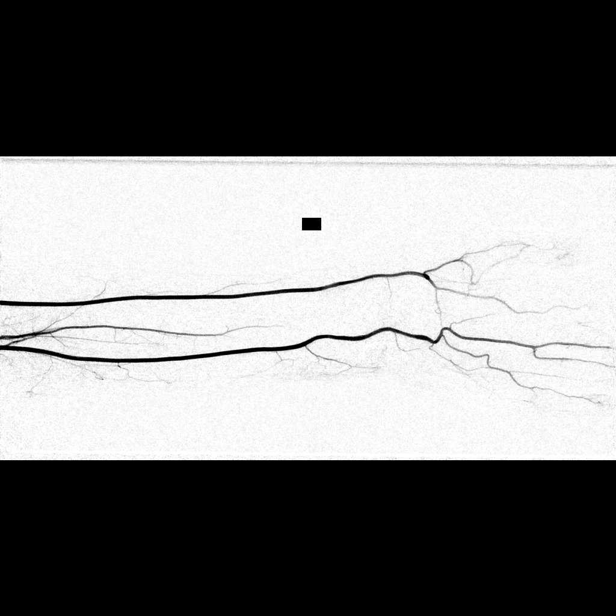
[im 37/71]
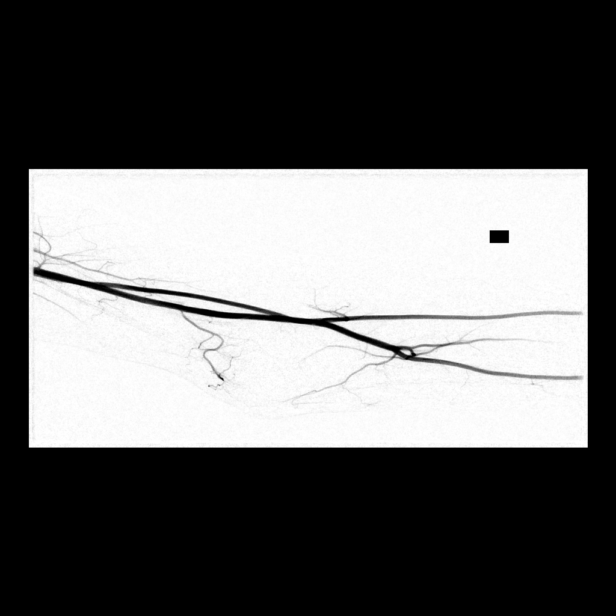
[im 40/71]
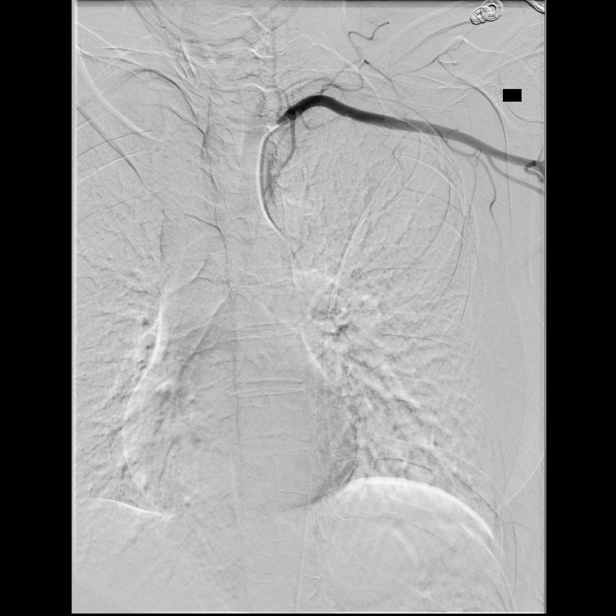
[im 46/71]
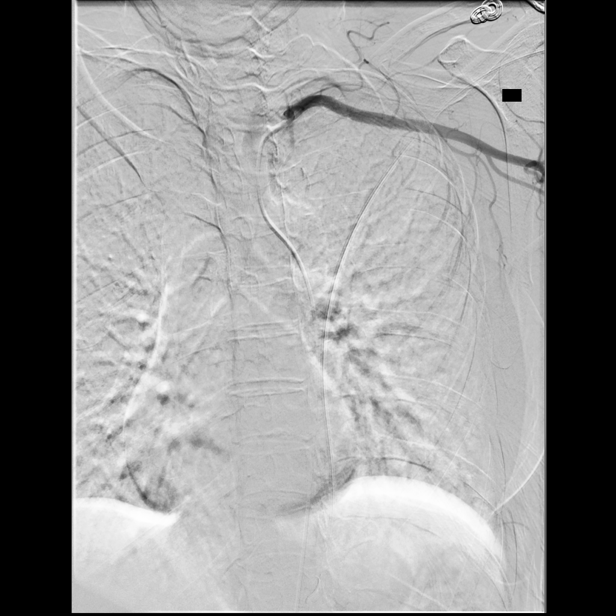
[im 52/71]
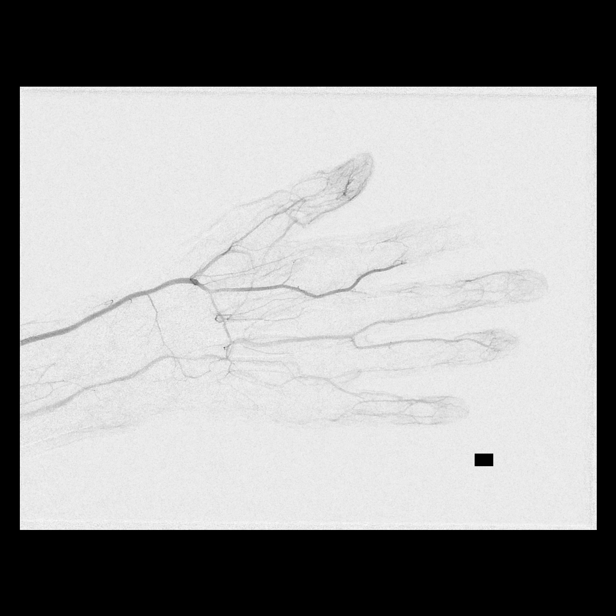
[im 58/71]
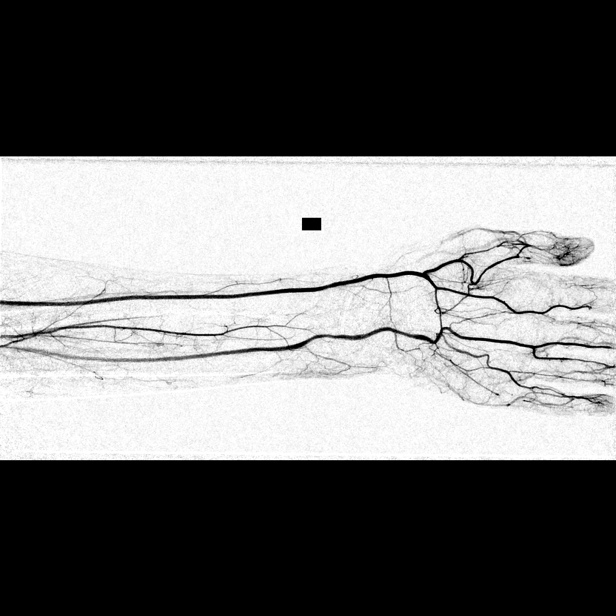
[im 64/71]
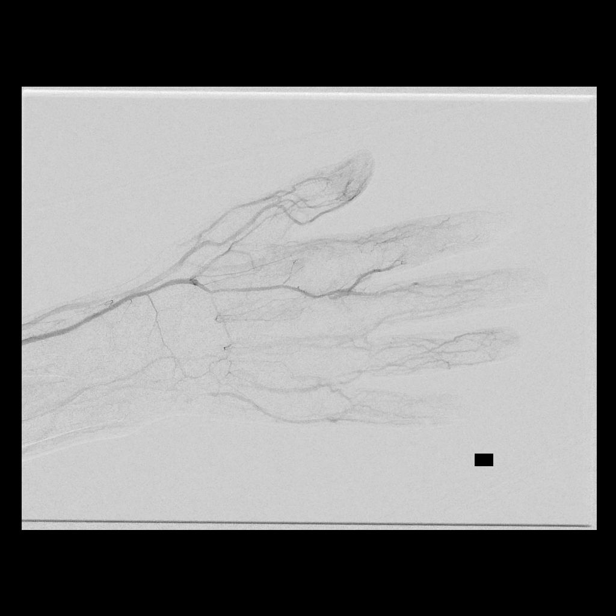
[im 71/71]
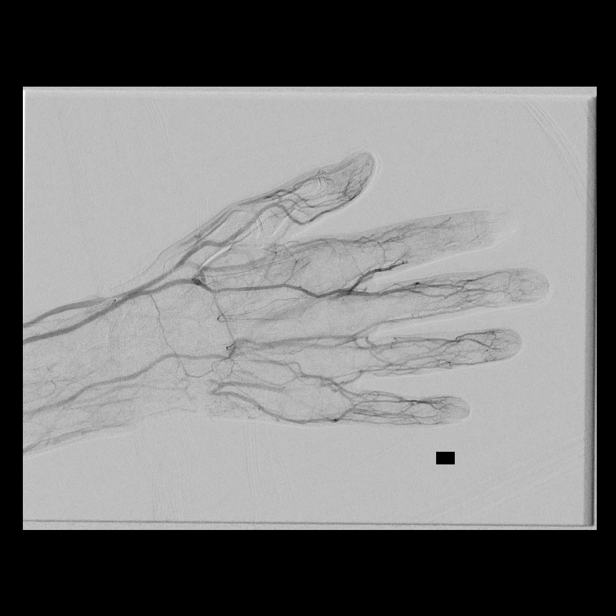

[13 of 24 positions shown; findings below may reference images not displayed]

EXAM:
LEFT UPPER EXTREMITY ARTERIOGRAPHY; ULTRASOUND GUIDANCE FOR VASCULAR
ACCESS

MEDICATIONS:
Nitroglycerin 200 mcg intra-arterial

ANESTHESIA/SEDATION:
Moderate (conscious) sedation was employed during this procedure. A
total of Versed 3.0 mg and Fentanyl 100 mcg was administered
intravenously.

Moderate Sedation Time: 46 minutes. The patient's level of
consciousness and vital signs were monitored continuously by
radiology nursing throughout the procedure under my direct
supervision.

CONTRAST:  111mL VISIPAQUE IODIXANOL 320 MG/ML IV SOLN

FLUOROSCOPY TIME:  Fluoroscopy Time: 5 minutes 30 seconds (14 mGy).

COMPLICATIONS:
None immediate.



Right groin was prepped and draped in a sterile fashion. Ultrasound
confirmed a patent right common femoral artery. Skin was
anesthetized with 1% lidocaine. Using ultrasound guidance, 21 gauge
needle directed into the right common femoral artery and a
micropuncture dilator set was placed. A 5 French vascular sheath was
placed. A 5 French vert catheter was used to cannulate the left
subclavian artery. Arteriogram was performed within the left
subclavian artery. The catheter was advanced into the left axillary
artery and additional angiography of the left upper extremity and
hand was performed. 200 mcg of nitroglycerin was injected
intra-arterially prior to obtaining images of the left hand. The 5
French catheter was removed without complication. An angiogram was
performed through the right groin sheath. An Exoseal closure device
was used for removal of the sheath and hemostasis.
FINDINGS: Left upper extremity: Left subclavian artery and left axillary
artery are widely patent. There is an early takeoff of the left
radial artery in the left axilla. The left brachial artery and left
radial artery are widely patent. The brachial artery bifurcates into
the interosseous artery and ulnar artery. No significant stenosis or
atherosclerotic disease in the left upper arm or left forearm.
Radial and ulnar artery are patent at the wrist. Radial artery
appears to be supplying the superficial arch.

Thumb: The princeps pollicis artery comes off the distal radial
artery and patent. There is some irregular blood flow at the level
of the interphalangeal joint but there is distal flow in the thumb.

Index finger: Common palmar digital artery between the index finger
and middle finger. There is no proper digital artery along the
radial aspect of the index finger. The proper digital artery along
the ulnar side of the index finger abruptly stops near the PIP
joint. There is no blood flow to the tip of the finger and this
corresponds to the patient's skin abnormality.

Middle finger: There is a common palmar digital artery between the
middle finger and ring finger. This common artery supplies the ulnar
proper digital artery in the middle finger. There is minimal
reconstitution of the radial proper digital artery along the distal
aspect. There is flow to the tip of the middle finger.

Ring finger: There is a proper digital artery along the radial
aspect of the ring finger. There is minor filling of the ulnar
proper digital artery. There is blood flow at the tip of the ring
finger.

Little finger: There is flow to the radial proper digital artery.
The ulnar proper digital artery has a focal of inclusion near the
base of the little finger but there is some reconstitution in the
mid little finger.
IMPRESSION: Diffuse peripheral small vessel disease in the left hand. No blood
flow to the tip of the index finger.

Early takeoff of the radial artery in the left axilla, normal
variant.

No significant vascular disease in the proximal aspect of the left
upper extremity.

## 2017-12-28 ENCOUNTER — Ambulatory Visit: Payer: BLUE CROSS/BLUE SHIELD | Admitting: Physician Assistant

## 2017-12-28 ENCOUNTER — Encounter: Payer: Self-pay | Admitting: Physician Assistant

## 2017-12-28 ENCOUNTER — Encounter

## 2017-12-28 VITALS — BP 118/80 | HR 120 | Ht 68.0 in | Wt 106.0 lb

## 2017-12-28 DIAGNOSIS — Z1211 Encounter for screening for malignant neoplasm of colon: Secondary | ICD-10-CM | POA: Diagnosis not present

## 2017-12-28 DIAGNOSIS — Z86010 Personal history of colonic polyps: Secondary | ICD-10-CM

## 2017-12-28 DIAGNOSIS — R1310 Dysphagia, unspecified: Secondary | ICD-10-CM

## 2017-12-28 DIAGNOSIS — M349 Systemic sclerosis, unspecified: Secondary | ICD-10-CM | POA: Diagnosis not present

## 2017-12-28 MED ORDER — PANTOPRAZOLE SODIUM 40 MG PO TBEC: 40.0000 mg | DELAYED_RELEASE_TABLET | Freq: Two times a day (BID) | ORAL | 11 refills | Status: DC

## 2017-12-28 NOTE — Progress Notes (Signed)
Subjective:    Patient ID: Lisa Long, female    DOB: 04-Mar-1966, 52 y.o.   MRN: [id number]  HPI Lisa Long is a pleasant 52 year old white female, known to Dr. Carlean Purl who was last seen in 2016 and comes back in today with complaints of significant dysphasia. She had undergone colonoscopy and EGD in July 2016.  She had a 3 mm tubular adenoma removed from the colon and noted to have diverticulosis in the left colon.  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  Showed a 5 cm hiatal hernia. 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  She has not had sclerodactyly and says she does not have any evidence of interstitial lung disease at this point. She has history of GERD but says that has gotten significantly worse over the past few years.  She has been on Protonix 40 mg every morning over the past several months with no significant improvement. She mentions that she was seen at [facility] about a year and a half ago because she could not get in with Dr. Arelia Longest.  She was seen by Dr. Therisa Doyne and underwent upper endoscopy.  Apparently she was found to have ulcerative esophagitis and therefore dilation was not done.  She had been given a prescription of Carafate which made her very sick.  She states that she did not feel that Dr. Therisa Doyne cared about her issues and she did not return. She has lost a good deal of weight over the past few years from her usual 126 down to 106 currently.  She is been trying to drink Ensure or boost on a daily basis. She has significant difficulty with solids currently and is having to eat very soft foods avoiding meats and breads.  Generally she is able to tolerate liquids without any difficulty.  She has had some odynophagia as well. Review of Systems Pertinent positive and negative review of  systems were noted in the above HPI section.  All other review of systems was otherwise negative.  Outpatient Encounter Medications as of 12/28/2017  Medication Sig  . acetaminophen (TYLENOL) 325 MG tablet Take 325-650 mg by mouth 2 (two) times daily as needed (for pain.).  [name] amLODipine (NORVASC) 10 MG tablet Take 10 mg by mouth daily.  [name] HYDROcodone-acetaminophen (NORCO) 7.5-325 MG tablet Take 1 tablet by mouth every 6 (six) hours as needed. for pain  . naproxen sodium (ALEVE) 220 MG tablet Take 220 mg by mouth daily as needed (for pain.).  [name] pantoprazole (PROTONIX) 40 MG tablet Take 1 tablet (40 mg total) by mouth 2 (two) times daily.  . [DISCONTINUED] pantoprazole (PROTONIX) 40 MG tablet Take 40 mg by mouth daily.   No facility-administered encounter medications on file as of 12/28/2017.    No Known Allergies Patient Active Problem List   Diagnosis Date Noted  . Abnormal PFTs (pulmonary function tests) 01/24/2015  . Scleroderma ([place])   . Hx of adenomatous polyp of colon 10/14/2014  . Visit for screening mammogram 07/23/2014  . Routine general medical examination at a health care facility 07/23/2014  . Esophageal stricture - GERD 07/23/2014  . Raynaud's disease, idiopathic 01/10/2014  . Cervical spondylosis without myelopathy 03/07/2013  . Alcoholic hepatitis 19/37/9024  . 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 IBS (irritable bowel syndrome) 02/24/2011  .  TOBACCO USE 12/31/2009  . GERD 12/31/2009  . Hyperglycemia 03/14/2008  . Vitamin B12 deficiency neuropathy ([place]) 02/21/2008  . Essential hypertension, benign 02/12/2008   Social History   Socioeconomic History  . Marital status: Married    Spouse name: Not on file  . Number of children: 0  . Years of education: COLLEGE  . Highest education level: Not on file  Occupational History    Employer: [employer]  . Financial resource strain: Not on file  . Food insecurity:    Worry: Not on file     Inability: Not on file  . Transportation needs:    Medical: Not on file    Non-medical: Not on file  Tobacco Use  . Smoking status: Former Smoker    Packs/day: 0.50    Years: 25.00    Pack years: 12.50    Types: Cigarettes  . Smokeless tobacco: Never Used  . Tobacco comment: Quit smoking cigarettes in 2017  Substance and Sexual Activity  . Alcohol use: Yes    Alcohol/week: 20.0 standard drinks    Types: 20 Glasses of wine per week    Comment:  3-4 drinks a week  . Drug use: No  . Sexual activity: Yes    Birth control/protection: Surgical    Comment: Husband had vasectomy  Lifestyle  . Physical activity:    Days per week: Not on file    Minutes per session: Not on file  . Stress: Not on file  Relationships  . Social connections:    Talks on phone: Not on file    Gets together: Not on file    Attends religious service: Not on file    Active member of club or organization: Not on file    Attends meetings of clubs or organizations: Not on file    Relationship status: Not on file  . Intimate partner violence:    Fear of current or ex partner: Not on file    Emotionally abused: Not on file    Physically abused: Not on file    Forced sexual activity: Not on file  Other Topics Concern  . Not on file  Social History Narrative   Regular exercise-Yes   Married    Ms. Berk family history includes ALS in her mother; Diabetes in her other; Hypertension in her other; Stroke in her other.      Objective:    Vitals:   12/28/17 1037  BP: 118/80  Pulse: (!) 120    Physical Exam; developed older white female in no acute distress, pleasant blood pressure 118/80 pulse 110, height 5 foot 8, weight 106, BMI of 16.1.  HEENT; nontraumatic normocephalic EOMI PERRLA sclera anicteric buccal mucosa moist, facial skin somewhat tight, Cardiovascular ;regular rate and rhythm with S1-S2 no murmur rub or gallop, Pulmonary; clear bilaterally, Abdomen ;soft flat no palpable tenderness, no  palpable mass or hepatosplenomegaly bowel sounds are present, Rectal ;exam not done, Extremities; no edema skin warm and dry, she is missing distal portion of several of her fingers, hands very sensitive to touch.  Neuro psych; alert and oriented, grossly nonfocal mood and affect appropriate       Assessment & Plan:   #24 53 year old female with history of CREST syndrome diagnosed about 3 years ago who presents now with significant dysphagia, odynophagia, and weight loss.  She has prior history of ringlike distal esophageal stricture which was dilated in 2016. He also apparently had EGD through [facility] about a  year and a half ago and was told she had ulcerative esophagitis so dilation was not done.  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  Diverticulosis  Plan; Patient is encouraged to follow a full liquid to very soft diet, and add protein supplements on a regular basis.  Suggested Ensure or boost high-protein 3 times daily between meals. Will increase Protonix to 40 mg p.o. twice daily AC breakfast and AC dinner. We discussed a bowel regimen for her chronic constipation which is likely multifactorial but in part secondary to hydrocodone use.  She will start Colace at bedtime, milk of magnesia 30 to 60 cc as needed, she did not tolerate MiraLAX which caused a lot of cramping area she will also start Smooth move, tea once daily  Patient will be scheduled for upper endoscopy with probable esophageal dilation with Dr. Carlean Purl.  Procedure was discussed in detail with patient including indications risks benefits and she is agreeable to proceed Lisa Percival S Hearl Heikes PA-C 12/28/2017   Cc: No ref. provider found

## 2017-12-28 NOTE — Patient Instructions (Signed)
Increase Protonix 40 mg to twice daily. Drink lots of water. Add Colace at bedtime.  Smooth move tea daily. Take Milk of Magnesia 30 - 60 cc's as needed for constipation. We have provided you with antireflux information.  Drink Ensure or Boost with High Protein 2-3 daily.   You have been scheduled for an endoscopy. Please follow written instructions given to you at your visit today. If you use inhalers (even only as needed), please bring them with you on the day of your procedure.   Normal BMI (Body Mass Index- based on height and weight) is between 19 and 25. Your BMI today is Body mass index is 16.12 kg/m. [name] Please consider follow up  regarding your BMI with your Primary Care Provider.

## 2018-01-17 ENCOUNTER — Ambulatory Visit: Payer: 59 | Admitting: Internal Medicine

## 2018-01-19 ENCOUNTER — Encounter: Payer: Self-pay | Admitting: Internal Medicine

## 2018-01-19 DIAGNOSIS — Z79899 Other long term (current) drug therapy: Secondary | ICD-10-CM | POA: Diagnosis not present

## 2018-02-02 ENCOUNTER — Encounter: Payer: BLUE CROSS/BLUE SHIELD | Admitting: Internal Medicine

## 2018-03-08 ENCOUNTER — Encounter: Payer: Self-pay | Admitting: Internal Medicine

## 2018-03-08 ENCOUNTER — Ambulatory Visit (AMBULATORY_SURGERY_CENTER): Payer: BLUE CROSS/BLUE SHIELD | Admitting: Internal Medicine

## 2018-03-08 VITALS — BP 104/74 | HR 94 | Temp 98.6°F | Resp 16 | Ht 68.0 in | Wt 106.0 lb

## 2018-03-08 DIAGNOSIS — R1310 Dysphagia, unspecified: Secondary | ICD-10-CM

## 2018-03-08 DIAGNOSIS — K449 Diaphragmatic hernia without obstruction or gangrene: Secondary | ICD-10-CM

## 2018-03-08 DIAGNOSIS — K222 Esophageal obstruction: Secondary | ICD-10-CM

## 2018-03-08 DIAGNOSIS — K2100 Gastro-esophageal reflux disease with esophagitis, without bleeding: Secondary | ICD-10-CM

## 2018-03-08 MED ORDER — SODIUM CHLORIDE 0.9 % IV SOLN: 500.0000 mL | Freq: Once | INTRAVENOUS | Status: DC

## 2018-03-08 NOTE — Progress Notes (Signed)
Called to room to assist during endoscopic procedure.  Patient ID and intended procedure confirmed with present staff. Received instructions for my participation in the procedure from the performing physician.  

## 2018-03-08 NOTE — Progress Notes (Signed)
Pt A/O x3, VSS, report to RN

## 2018-03-08 NOTE — Patient Instructions (Addendum)
The esophagus is inflamed and there was a stricture (narrowing) where the esophagus and stomach join as before.  I took biopsies and dilated the stricture.  The scleroderma also affects how the esophagus squeezes and relaxes and that may be part of the problem also.  Depending upon how you feel after this procedure may need to do a barium swallow test to get more information to use in treating you.  We also need to consider changing from pantoprazole to a different acid blocker - will discuss.  I will check in with your symptoms when the pathology results return. I expect they will show reflux but I am being careful to get ll information we can.  Follow an antireflux regimen.  This includes:      - Do not lie down for at least 3 to 4 hours after meals.       - Raise the head of the bed 4 to 6 inches.       - Decrease excess weight.       - Avoid citrus juices and other acidic foods, alcohol, chocolate, mints, coffee and other caffeinated beverages, carbonated beverages, fatty and fried foods.       - Avoid tight-fitting clothing.       - Avoid cigarettes and other tobacco products.   I appreciate the opportunity to care for you. Gatha Mayer, MD, FACG   YOU HAD AN ENDOSCOPIC PROCEDURE TODAY AT [facility]:   Refer to the procedure report that was given to you for any specific questions about what was found during the examination.  If the procedure report does not answer your questions, please call your gastroenterologist to clarify.  If you requested that your care partner not be given the details of your procedure findings, then the procedure report has been included in a sealed envelope for you to review at your convenience later.  YOU SHOULD EXPECT: Some feelings of bloating in the abdomen. Passage of more gas than usual.  Walking can help get rid of the air that was put into your GI tract during the procedure and reduce the bloating. If you had a lower  endoscopy (such as a colonoscopy or flexible sigmoidoscopy) you may notice spotting of blood in your stool or on the toilet paper. If you underwent a bowel prep for your procedure, you may not have a normal bowel movement for a few days.  Please Note:  You might notice some irritation and congestion in your nose or some drainage.  This is from the oxygen used during your procedure.  There is no need for concern and it should clear up in a day or so.  SYMPTOMS TO REPORT IMMEDIATELY:    Following upper endoscopy (EGD)  Vomiting of blood or coffee ground material  New chest pain or pain under the shoulder blades  Painful or persistently difficult swallowing  New shortness of breath  Fever of 100F or higher  Black, tarry-looking stools  For urgent or emergent issues, a gastroenterologist can be reached at any hour by calling [phone].  Be sure to take you Gaviscon 1 tbsp 1/2 hour before bedtime.  Take it on an empty stomach. Read the handouts given to you by your recovery room nurse.   DIET:  We do recommend clear liquids until 11am, and then soft food for the rest of today.  Then, you may proceed to your regular diet tomorrow..  Drink plenty of fluids but you should avoid  alcoholic beverages for 24 hours.  ACTIVITY:  You should plan to take it easy for the rest of today and you should NOT DRIVE or use heavy machinery until tomorrow (because of the sedation medicines used during the test).    FOLLOW UP: Our staff will call the number listed on your records the next business day following your procedure to check on you and address any questions or concerns that you may have regarding the information given to you following your procedure. If we do not reach you, we will leave a message.  However, if you are feeling well and you are not experiencing any problems, there is no need to return our call.  We will assume that you have returned to your regular daily activities without  incident.  If any biopsies were taken you will be contacted by phone or by letter within the next 1-3 weeks.  Please call us at [phone] if you have not heard about the biopsies in 3 weeks.    SIGNATURES/CONFIDENTIALITY: You and/or your care partner have signed paperwork which will be entered into your electronic medical record.  These signatures attest to the fact that that the information above on your After Visit Summary has been reviewed and is understood.  Full responsibility of the confidentiality of this discharge information lies with you and/or your care-partner.  See handouts for further suggestions.

## 2018-03-09 ENCOUNTER — Telehealth: Payer: Self-pay

## 2018-03-09 NOTE — Telephone Encounter (Signed)
Second follow up phone call attempt, no answer, message left 

## 2018-03-09 NOTE — Telephone Encounter (Signed)
No answer, left message to call back later today, B.Eleni Frank RN. 

## 2018-03-09 NOTE — Op Note (Addendum)
[place] Patient Name: Lisa Long Procedure Date: 03/08/2018 9:12 AM MRN: [id number] Endoscopist: Gatha Mayer , MD Age: 52 Referring MD:  Date of Birth: 04-09-1965 Gender: Female Account #: [id number] Procedure:                Upper GI endoscopy Indications:              Dysphagia, Stricture of the esophagus, For therapy                            of esophageal stricture - has scleroderma Medicines:                Propofol per Anesthesia, Monitored Anesthesia Care Procedure:                Pre-Anesthesia Assessment:                           - Prior to the procedure, a History and Physical                            was performed, and patient medications and                            allergies were reviewed. The patient's tolerance of                            previous anesthesia was also reviewed. The risks                            and benefits of the procedure and the sedation                            options and risks were discussed with the patient.                            All questions were answered, and informed consent                            was obtained. Prior Anticoagulants: The patient has                            taken no previous anticoagulant or antiplatelet                            agents. ASA Grade Assessment: II - A patient with                            mild systemic disease. After reviewing the risks                            and benefits, the patient was deemed in                            satisfactory condition to undergo the procedure.  After obtaining informed consent, the endoscope was                            passed under direct vision. Throughout the                            procedure, the patient's blood pressure, pulse, and                            oxygen saturations were monitored continuously. The                            Endoscope was introduced through the mouth, and              advanced to the second part of duodenum. The upper                            GI endoscopy was accomplished without difficulty.                            The patient tolerated the procedure well. Scope In: Scope Out: Findings:                 LA Grade B (one or more mucosal breaks greater than                            5 mm, not extending between the tops of two mucosal                            folds) esophagitis with no bleeding was found 30 to                            35 cm from the incisors. Biopsies were taken with a                            cold forceps for histology. Verification of patient                            identification for the specimen was done. Estimated                            blood loss was minimal.                           One benign-appearing, intrinsic moderate                            (circumferential scarring or stenosis; an endoscope                            may pass) stenosis was found 35 cm from the                            incisors. The stenosis was traversed.  A TTS dilator                            was passed through the scope. Dilation with an                            18-19-20 mm balloon dilator was performed to 20 mm.                            The dilation site was examined and showed no                            change. Estimated blood loss: none. then biopsy                            forceps used to disrupt stricture w/ minimal                            bleeding.                           A 4 cm hiatal hernia was present.                           Diffuse moderately erythematous mucosa was found in                            the cardia, in the gastric fundus and in the                            gastric body.                           The exam was otherwise without abnormality.                           The cardia and gastric fundus were otherwise normal                            on retroflexion. Complications:             No immediate complications. Estimated Blood Loss:     Estimated blood loss was minimal. Impression:               - LA Grade B reflux esophagitis. Biopsied.                           - Benign-appearing esophageal stenosis. Dilated. No                            effect at 20 mm so disrupted with forceps as I did                            in 2016.                           -  4 cm hiatal hernia.                           - Erythematous mucosa in the cardia, gastric fundus                            and gastric body.                           - The examination was otherwise normal. Recommendation:           - Patient has a contact number available for                            emergencies. The signs and symptoms of potential                            delayed complications were discussed with the                            patient. Return to normal activities tomorrow.                            Written discharge instructions were provided to the                            patient.                           - Clear liquids x 1 hour then soft foods rest of                            day. Start prior diet tomorrow.                           - Continue present medications.                           - Await pathology results.                           - reassess symptoms when call about pathology                           may need ba swallow/UGI, ? dilation of entire                            esophagus under fluoro with bougie                           consider changing acid suppression - currently on                            40 mg bid pantoprazole                           add  Gaviscon at bedtime now, maybe H2 B - taking a                            pink pill at bedtime sometimes - dissolvable -                            helps soome - she is to ID that                           ? prokinetic?                           Follow an antireflux regimen. This includes:                            - Do not lie down for at least 3 to 4 hours after                            meals.                           - Raise the head of the bed 4 to 6 inches.                           - Decrease excess weight.                           - Avoid citrus juices and other acidic foods,                            alcohol, chocolate, mints, coffee and other                            caffeinated beverages, carbonated beverages, fatty                            and fried foods.                           - Avoid tight-fitting clothing.                           - Avoid cigarettes and other tobacco products. Gatha Mayer, MD 03/08/2018 9:50:09 AM This report has been signed electronically.

## 2018-03-17 NOTE — Progress Notes (Signed)
[place] - nno letter or recall  Office - biopsies were ok  Please see if she is swallowing better and find out if she is having any difficulty swallowing or other esophageal/abdominal sxs  and let me know what she says

## 2018-03-20 ENCOUNTER — Other Ambulatory Visit: Payer: Self-pay

## 2018-03-20 DIAGNOSIS — R1310 Dysphagia, unspecified: Secondary | ICD-10-CM

## 2018-03-20 NOTE — Progress Notes (Signed)
Schedule barium swallow with tablet dx dysphagia

## 2018-03-28 ENCOUNTER — Ambulatory Visit (HOSPITAL_COMMUNITY)
Admission: RE | Admit: 2018-03-28 | Discharge: 2018-03-28 | Disposition: A | Discharge status: Home / Self Care | Payer: BLUE CROSS/BLUE SHIELD | Source: Ambulatory Visit | Attending: Internal Medicine | Admitting: Internal Medicine

## 2018-03-28 DIAGNOSIS — K219 Gastro-esophageal reflux disease without esophagitis: Secondary | ICD-10-CM | POA: Diagnosis not present

## 2018-03-28 DIAGNOSIS — R1310 Dysphagia, unspecified: Secondary | ICD-10-CM | POA: Insufficient documentation

## 2018-03-28 DIAGNOSIS — K449 Diaphragmatic hernia without obstruction or gangrene: Secondary | ICD-10-CM | POA: Diagnosis not present

## 2018-04-03 ENCOUNTER — Other Ambulatory Visit: Payer: Self-pay | Admitting: Internal Medicine

## 2018-04-03 ENCOUNTER — Other Ambulatory Visit: Payer: Self-pay

## 2018-04-03 MED ORDER — FAMOTIDINE 40 MG PO TABS: 40.0000 mg | ORAL_TABLET | Freq: Every day | ORAL | 1 refills | Status: DC

## 2018-04-03 MED ORDER — DEXLANSOPRAZOLE 60 MG PO CPDR: 60.0000 mg | DELAYED_RELEASE_CAPSULE | Freq: Every day | ORAL | 3 refills | Status: DC

## 2018-04-03 NOTE — Progress Notes (Signed)
No obstruction or stricture seen.  I do think there are abnormalities of the esophageal function (I disagree with radiologist). Scleroderma does this. No way to fix it. Reflux is a big problem in scleroderma. Need to try to treat it differently since not better on current tx. Percocet can also effect it.   Please Ask her to: 1) change PPI to Dexilant 60 mg daily 30 mins before breakfast (we may need a prior auth?)  2)  Will dc pantoprazole.  3) and also Rx famotidine 40 mg qhs can do 90 day supplies w/ 1 RF for each  4) OV me ion Feb  5) Modify diet as she has been doing - she should do dysphagia 2 to 3  please send her those diets by My Chart or mail

## 2018-04-19 DIAGNOSIS — M341 CR(E)ST syndrome: Secondary | ICD-10-CM | POA: Diagnosis not present

## 2018-04-19 DIAGNOSIS — M79641 Pain in right hand: Secondary | ICD-10-CM | POA: Diagnosis not present

## 2018-05-02 DIAGNOSIS — Z79899 Other long term (current) drug therapy: Secondary | ICD-10-CM | POA: Diagnosis not present

## 2018-05-09 ENCOUNTER — Ambulatory Visit (INDEPENDENT_AMBULATORY_CARE_PROVIDER_SITE_OTHER): Payer: BLUE CROSS/BLUE SHIELD | Admitting: Internal Medicine

## 2018-05-09 ENCOUNTER — Telehealth: Payer: Self-pay

## 2018-05-09 ENCOUNTER — Encounter: Payer: Self-pay | Admitting: Internal Medicine

## 2018-05-09 VITALS — BP 100/70 | HR 112 | Ht 67.0 in | Wt 110.4 lb

## 2018-05-09 DIAGNOSIS — K2100 Gastro-esophageal reflux disease with esophagitis, without bleeding: Secondary | ICD-10-CM

## 2018-05-09 DIAGNOSIS — M341 CR(E)ST syndrome: Secondary | ICD-10-CM

## 2018-05-09 MED ORDER — ESOMEPRAZOLE MAGNESIUM 40 MG PO CPDR: 40.0000 mg | DELAYED_RELEASE_CAPSULE | Freq: Two times a day (BID) | ORAL | 3 refills | Status: DC

## 2018-05-09 NOTE — Patient Instructions (Signed)
  We have sent the following medications to your pharmacy for you to pick up at your convenience: Nexium   We will be referring you to [facility] and will be in touch about this appointment.   I appreciate the opportunity to care for you. Silvano Rusk, MD, [facility]

## 2018-05-09 NOTE — Progress Notes (Signed)
Lisa Long 53 y.o. Nov 29, 1965 [id number]  Assessment & Plan:   Encounter Diagnoses  Name Primary?  . Gastroesophageal reflux disease with esophagitis Yes  . CREST syndrome (HCC)     We will switch her to as omeprazole 40 mg twice daily before breakfast and supper.  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  I am not sure there is much that can be done.  Perhaps a total esophageal dilation would make some difference but I am not convinced.  Esophageal manometry would tell us more but I am not sure it would change therapy.  I have limited experience in this setting so I think a tertiary referral to [facility] is worthwhile though I have told her they might not be able to do much else but it is worth getting their opinion.  It is possible that her chronic hydrocodone use is contributing to her dysphagia.  She says she needs that for her body pain.  She is quite emotional, understandably so and repeatedly apologizes for this but she does have a chronic illness and suffers.  I appreciate the opportunity to care for this patient.  Subjective:   Chief Complaint: Dysphagia  HPI Lisa Long is here with her husband, with persistent dysphagia.  She has a history of scleroderma and crest syndrome, in the past I had dilated a ring with success, but she has had worsening problems in difficulty with weight loss and recent endoscopy showed grade B esophagitis, a ring but it did not disrupt with a 20 mm dilation so used forceps as I had had previously.  She also has a small to medium hiatal hernia.  Unfortunately she did not get any benefit.  I did a barium swallow and though the radiologist reported it is normal, I think it shows evidence of dysmotility.  Dexilant was tried and it made her nauseous so she stopped it.  She is on some over-the-counter Nexium and thinks that may be helping some.  She is forcing herself to eat and has gained 5 pounds but she is  tearful at times and quite distressed and tired of feeling poorly.  She indicates that wherever she goes the doctors tell her that they do not have any therapy to help her.  Wt Readings from Last 3 Encounters:  05/09/18 110 lb 6 oz (50.1 kg)  03/08/18 106 lb (48.1 kg)  12/28/17 106 lb (48.1 kg)    No Known Allergies Current Meds  Medication Sig  . acetaminophen (TYLENOL) 325 MG tablet Take 325-650 mg by mouth 2 (two) times daily as needed (for pain.).  [name] amLODipine (NORVASC) 10 MG tablet Take 10 mg by mouth daily.  [name] HYDROcodone-acetaminophen (NORCO) 7.5-325 MG tablet Take 1 tablet by mouth every 6 (six) hours as needed. for pain  . naproxen sodium (ALEVE) 220 MG tablet Take 220 mg by mouth daily as needed (for pain.).  . [DISCONTINUED] Esomeprazole Magnesium (NEXIUM 24HR) 20 MG TBEC Take 1 tablet by mouth daily.   Past Medical History:  Diagnosis Date  . Cervical spondylosis without myelopathy   . CREST (calcinosis, Raynaud's phenomenon, esophageal dysfunction, sclerodactyly, telangiectasia) ([place])   . CREST syndrome ([place])   . Esophageal stricture - GERD 07/23/2014  . GERD (gastroesophageal reflux disease)   . Hx of adenomatous polyp of colon 10/14/2014  . Hypertension   . IBS (irritable bowel syndrome)   . Raynaud disease   . Raynaud's disease, idiopathic   . Seasonal allergies   .  Spinal stenosis   . Tobacco use   . Vitamin B12 deficiency    Past Surgical History:  Procedure Laterality Date  . 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 AMPUTATION Right 05/31/2017   Procedure: Right middle finger amputation/revision amputation at distal interphalangeal versus proximal interphalangeal with repair reconstruction as needed;  Surgeon: Roseanne Kaufman, MD;  Location: [place];  Service: Orthopedics;  Laterality: Right;  60 mins  . BREAST  SURGERY     benign breast cysts  . CERVICAL FUSION    . COLONOSCOPY    . DERMOID CYST  EXCISION    . ESOPHAGOGASTRODUODENOSCOPY (EGD) WITH ESOPHAGEAL DILATION  2016  . 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 HEMORRHOID SURGERY    . IR GENERIC HISTORICAL  01/29/2016   IR ANGIOGRAM EXTREMITY LEFT 01/29/2016 [name], MD [facility]INTER[facility]  . IR GENERIC HISTORICAL  01/29/2016   IR US GUIDE VASC ACCESS RIGHT 01/29/2016 [name], MD [facility]  . NAILBED REPAIR Right 01/30/2013   Procedure: BIOPSY OF RIGHT LONG NAIL FOLD;  Surgeon: [name]., MD;  Location: [place];  Service: Orthopedics;  Laterality: Right;  Penrose used as tourniquet--on at 0944, off at 1001.  . OOPHORECTOMY     Right ovary removal only  . SYMPATHECTOMY Right 01/30/2015   Procedure: DIGITAL SYMPATHECTOMY, RIGHT RADIAL ARTERY, ULNAR ARTERY, ARCH DIGITAL VESSELS INDEX MIDDLE RING SMALL GRAFT ULNAR ARTERY ;  Surgeon: [name], MD;  Location: [place];  Service: Orthopedics;  Laterality: Right;  . SYMPATHECTOMY Left 03/04/2016   Procedure: Digital SYMPATHECTOMY, left radial, ulnar, arch and common digital times 3;  Surgeon: [name], MD;  Location: [place];  Service: Orthopedics;  Laterality: Left;  Digital SYMPATHECTOMY, left radial, ulnar, arch and common digital times 3   Social History   Social History Narrative   Married and no children   Employed at [employer] for many years but 20 18-19 retired due to medical disability   She is a former smoker, 3-4 alcoholic drinks a week, no drug use   family history includes ALS in her mother; Diabetes in an other family member; Hypertension in an other family member; Stroke in an other family member.   Review of Systems See HPI  Objective:   Physical Exam BP 100/70 (BP Location: Left Arm, Patient Position: Sitting, Cuff Size: Normal)   Pulse (!) 112   Ht 5\' 7"  (1.702 m) Comment:  height measured without shoes  Wt 110 lb 6 oz (50.1 kg)   LMP 01/28/2010 (Approximate)   BMI 17.29 kg/m  No acute distress but anxious and tearful

## 2018-05-09 NOTE — Telephone Encounter (Signed)
I will call [facility] tomorrow to confirm the form and fax # for Dr Kris Mouton.

## 2018-05-09 NOTE — Telephone Encounter (Signed)
-----   Message from Gatha Mayer, MD sent at 05/09/2018  2:17 PM EST ----- Regarding: [facility] referral Please refer her to Dr. Valentino Saxon at [facility]  Dx: Dysphagia, GERD and CREST syndrome

## 2018-05-10 NOTE — Telephone Encounter (Signed)
I have faxed the referral form and requested information for appointment to be set up with Dr Valentino Saxon per Dr Silvano Rusk, faxed to # [phone].

## 2018-05-10 NOTE — Telephone Encounter (Signed)
Attempted to put it in proficient and it froze up and would send the information.

## 2018-05-10 NOTE — Telephone Encounter (Signed)
I called [facility] and they are faxing me an updated referral form.

## 2018-06-01 NOTE — Telephone Encounter (Signed)
Per [facility] GI department they are calling her today to set up her appointment.

## 2018-06-05 DIAGNOSIS — M79642 Pain in left hand: Secondary | ICD-10-CM | POA: Diagnosis not present

## 2018-06-05 DIAGNOSIS — I998 Other disorder of circulatory system: Secondary | ICD-10-CM | POA: Diagnosis not present

## 2018-06-06 MED FILL — NITRO-BID 2% OINTMENT: 2 | 30 days supply | Qty: 60 | Fill #0

## 2018-06-15 DIAGNOSIS — I998 Other disorder of circulatory system: Secondary | ICD-10-CM | POA: Diagnosis not present

## 2018-06-15 DIAGNOSIS — M341 CR(E)ST syndrome: Secondary | ICD-10-CM | POA: Diagnosis not present

## 2018-06-23 ENCOUNTER — Telehealth: Payer: Self-pay

## 2018-06-23 NOTE — Telephone Encounter (Signed)
I was calling to check with [facility] on patients appointment. They said in late February they would reach out to her. Currently they have her set up for 07/03/2018 at noon with Dr. Adria Devon . The lady told me with the COVID-19 epidemic going on her appointment may get postponed.

## 2018-06-28 DIAGNOSIS — M349 Systemic sclerosis, unspecified: Secondary | ICD-10-CM | POA: Diagnosis not present

## 2018-07-24 DIAGNOSIS — M79642 Pain in left hand: Secondary | ICD-10-CM | POA: Diagnosis not present

## 2018-07-24 DIAGNOSIS — M341 CR(E)ST syndrome: Secondary | ICD-10-CM | POA: Diagnosis not present

## 2018-07-24 DIAGNOSIS — I998 Other disorder of circulatory system: Secondary | ICD-10-CM | POA: Diagnosis not present

## 2018-07-25 ENCOUNTER — Other Ambulatory Visit: Payer: Self-pay

## 2018-07-25 ENCOUNTER — Encounter (HOSPITAL_BASED_OUTPATIENT_CLINIC_OR_DEPARTMENT_OTHER): Payer: Self-pay | Admitting: *Deleted

## 2018-07-26 ENCOUNTER — Encounter (HOSPITAL_BASED_OUTPATIENT_CLINIC_OR_DEPARTMENT_OTHER)
Admission: RE | Admit: 2018-07-26 | Discharge: 2018-07-26 | Disposition: A | Discharge status: Home / Self Care | Payer: BLUE CROSS/BLUE SHIELD | Source: Ambulatory Visit | Attending: Orthopedic Surgery | Admitting: Orthopedic Surgery

## 2018-07-26 DIAGNOSIS — Z01812 Encounter for preprocedural laboratory examination: Secondary | ICD-10-CM | POA: Insufficient documentation

## 2018-07-26 NOTE — Progress Notes (Signed)
Ensure pre surgery drink given with instructions to complete by 0430 dos, pt verbalized understanding. 

## 2018-07-26 NOTE — Pre-Procedure Instructions (Signed)
Pt called and re screened for s/s of Covid-19 virus. Denies: SOB, fever, chills, head or body ache's, runny nose, sneezing or sore throat. Denies any travel out of the state in the last 3 weeks. States that no one in the home has any of the above symptoms. Instructions and arrival time reviewed. 

## 2018-07-27 ENCOUNTER — Ambulatory Visit (HOSPITAL_BASED_OUTPATIENT_CLINIC_OR_DEPARTMENT_OTHER): Payer: BLUE CROSS/BLUE SHIELD | Admitting: Certified Registered"

## 2018-07-27 ENCOUNTER — Encounter (HOSPITAL_BASED_OUTPATIENT_CLINIC_OR_DEPARTMENT_OTHER)
Admission: RE | Disposition: A | Discharge status: Home / Self Care | Payer: Self-pay | Source: Home / Self Care | Attending: Orthopedic Surgery

## 2018-07-27 ENCOUNTER — Ambulatory Visit (HOSPITAL_BASED_OUTPATIENT_CLINIC_OR_DEPARTMENT_OTHER)
Admission: RE | Admit: 2018-07-27 | Discharge: 2018-07-27 | Disposition: A | Discharge status: Home / Self Care | Payer: BLUE CROSS/BLUE SHIELD | Attending: Orthopedic Surgery | Admitting: Orthopedic Surgery

## 2018-07-27 ENCOUNTER — Other Ambulatory Visit: Payer: Self-pay

## 2018-07-27 ENCOUNTER — Encounter (HOSPITAL_BASED_OUTPATIENT_CLINIC_OR_DEPARTMENT_OTHER): Payer: Self-pay | Admitting: *Deleted

## 2018-07-27 DIAGNOSIS — Z89022 Acquired absence of left finger(s): Secondary | ICD-10-CM | POA: Insufficient documentation

## 2018-07-27 DIAGNOSIS — K759 Inflammatory liver disease, unspecified: Secondary | ICD-10-CM | POA: Diagnosis not present

## 2018-07-27 DIAGNOSIS — K589 Irritable bowel syndrome without diarrhea: Secondary | ICD-10-CM | POA: Insufficient documentation

## 2018-07-27 DIAGNOSIS — K219 Gastro-esophageal reflux disease without esophagitis: Secondary | ICD-10-CM | POA: Diagnosis not present

## 2018-07-27 DIAGNOSIS — Z79899 Other long term (current) drug therapy: Secondary | ICD-10-CM | POA: Diagnosis not present

## 2018-07-27 DIAGNOSIS — I739 Peripheral vascular disease, unspecified: Secondary | ICD-10-CM | POA: Diagnosis not present

## 2018-07-27 DIAGNOSIS — M341 CR(E)ST syndrome: Secondary | ICD-10-CM | POA: Diagnosis not present

## 2018-07-27 DIAGNOSIS — Z823 Family history of stroke: Secondary | ICD-10-CM | POA: Diagnosis not present

## 2018-07-27 DIAGNOSIS — E538 Deficiency of other specified B group vitamins: Secondary | ICD-10-CM | POA: Diagnosis not present

## 2018-07-27 DIAGNOSIS — Z89021 Acquired absence of right finger(s): Secondary | ICD-10-CM | POA: Insufficient documentation

## 2018-07-27 DIAGNOSIS — I96 Gangrene, not elsewhere classified: Secondary | ICD-10-CM | POA: Diagnosis not present

## 2018-07-27 DIAGNOSIS — I10 Essential (primary) hypertension: Secondary | ICD-10-CM | POA: Diagnosis not present

## 2018-07-27 DIAGNOSIS — Z8249 Family history of ischemic heart disease and other diseases of the circulatory system: Secondary | ICD-10-CM | POA: Diagnosis not present

## 2018-07-27 DIAGNOSIS — Z981 Arthrodesis status: Secondary | ICD-10-CM | POA: Diagnosis not present

## 2018-07-27 DIAGNOSIS — Z86010 Personal history of colonic polyps: Secondary | ICD-10-CM | POA: Insufficient documentation

## 2018-07-27 DIAGNOSIS — Z90721 Acquired absence of ovaries, unilateral: Secondary | ICD-10-CM | POA: Diagnosis not present

## 2018-07-27 DIAGNOSIS — Z833 Family history of diabetes mellitus: Secondary | ICD-10-CM | POA: Diagnosis not present

## 2018-07-27 DIAGNOSIS — Z820 Family history of epilepsy and other diseases of the nervous system: Secondary | ICD-10-CM | POA: Insufficient documentation

## 2018-07-27 DIAGNOSIS — E1152 Type 2 diabetes mellitus with diabetic peripheral angiopathy with gangrene: Secondary | ICD-10-CM | POA: Diagnosis not present

## 2018-07-27 DIAGNOSIS — R739 Hyperglycemia, unspecified: Secondary | ICD-10-CM | POA: Diagnosis not present

## 2018-07-27 DIAGNOSIS — Z87891 Personal history of nicotine dependence: Secondary | ICD-10-CM | POA: Insufficient documentation

## 2018-07-27 DIAGNOSIS — M87044 Idiopathic aseptic necrosis of right finger(s): Secondary | ICD-10-CM | POA: Diagnosis not present

## 2018-07-27 HISTORY — PX: AMPUTATION: SHX166

## 2018-07-27 SURGERY — AMPUTATION DIGIT
Anesthesia: General | Site: Hand | Laterality: Left

## 2018-07-27 MED ORDER — PROMETHAZINE HCL 25 MG/ML IJ SOLN: 6.2500 mg | INTRAMUSCULAR | Status: DC | PRN

## 2018-07-27 MED ORDER — LIDOCAINE 2% (20 MG/ML) 5 ML SYRINGE
INTRAMUSCULAR | Status: AC
  Filled 2018-07-27: qty 5

## 2018-07-27 MED ORDER — EPINEPHRINE PF 1 MG/ML IJ SOLN
INTRAMUSCULAR | Status: AC
  Filled 2018-07-27: qty 1

## 2018-07-27 MED ORDER — PROPOFOL 10 MG/ML IV BOLUS
INTRAVENOUS | Status: DC | PRN
  Administered 2018-07-27: 150 mg via INTRAVENOUS
  Administered 2018-07-27: 100 mg via INTRAVENOUS

## 2018-07-27 MED ORDER — HYDROMORPHONE HCL 1 MG/ML IJ SOLN
0.2500 mg | INTRAMUSCULAR | Status: DC | PRN
  Administered 2018-07-27: 0.25 mg via INTRAVENOUS
  Administered 2018-07-27: 10:00:00 0.5 mg via INTRAVENOUS
  Administered 2018-07-27: 10:00:00 0.25 mg via INTRAVENOUS

## 2018-07-27 MED ORDER — FENTANYL CITRATE (PF) 100 MCG/2ML IJ SOLN: 50.0000 ug | INTRAMUSCULAR | Status: DC | PRN

## 2018-07-27 MED ORDER — SODIUM BICARBONATE 4.2 % IV SOLN
INTRAVENOUS | Status: AC
  Filled 2018-07-27: qty 10

## 2018-07-27 MED ORDER — MEPERIDINE HCL 25 MG/ML IJ SOLN: 6.2500 mg | INTRAMUSCULAR | Status: DC | PRN

## 2018-07-27 MED ORDER — OXYCODONE HCL 5 MG PO TABS: 5.0000 mg | ORAL_TABLET | Freq: Once | ORAL | Status: DC | PRN

## 2018-07-27 MED ORDER — MIDAZOLAM HCL 2 MG/2ML IJ SOLN: 1.0000 mg | INTRAMUSCULAR | Status: DC | PRN

## 2018-07-27 MED ORDER — SCOPOLAMINE 1 MG/3DAYS TD PT72: 1.0000 | MEDICATED_PATCH | Freq: Once | TRANSDERMAL | Status: DC | PRN

## 2018-07-27 MED ORDER — BUPIVACAINE-EPINEPHRINE (PF) 0.25% -1:200000 IJ SOLN
INTRAMUSCULAR | Status: AC
  Filled 2018-07-27: qty 30

## 2018-07-27 MED ORDER — OXYCODONE HCL 5 MG/5ML PO SOLN: 5.0000 mg | Freq: Once | ORAL | Status: DC | PRN

## 2018-07-27 MED ORDER — BUPIVACAINE HCL (PF) 0.5 % IJ SOLN
INTRAMUSCULAR | Status: DC | PRN
  Administered 2018-07-27: 10 mL

## 2018-07-27 MED ORDER — CEPHALEXIN 500 MG PO CAPS: 500.0000 mg | ORAL_CAPSULE | Freq: Four times a day (QID) | ORAL | 0 refills | Status: AC

## 2018-07-27 MED ORDER — FENTANYL CITRATE (PF) 100 MCG/2ML IJ SOLN
INTRAMUSCULAR | Status: DC | PRN
  Administered 2018-07-27 (×4): 25 ug via INTRAVENOUS
  Administered 2018-07-27: 50 ug via INTRAVENOUS
  Administered 2018-07-27 (×2): 25 ug via INTRAVENOUS

## 2018-07-27 MED ORDER — LIDOCAINE 2% (20 MG/ML) 5 ML SYRINGE
INTRAMUSCULAR | Status: DC | PRN
  Administered 2018-07-27: 40 mg via INTRAVENOUS

## 2018-07-27 MED ORDER — MIDAZOLAM HCL 2 MG/2ML IJ SOLN
INTRAMUSCULAR | Status: DC | PRN
  Administered 2018-07-27 (×2): 1 mg via INTRAVENOUS

## 2018-07-27 MED ORDER — LACTATED RINGERS IV SOLN
INTRAVENOUS | Status: DC
  Administered 2018-07-27: 08:00:00 via INTRAVENOUS

## 2018-07-27 MED ORDER — KETOROLAC TROMETHAMINE 30 MG/ML IJ SOLN
INTRAMUSCULAR | Status: DC | PRN
  Administered 2018-07-27: 30 mg via INTRAVENOUS

## 2018-07-27 MED ORDER — CEFAZOLIN SODIUM-DEXTROSE 2-4 GM/100ML-% IV SOLN
2.0000 g | INTRAVENOUS | Status: AC
  Administered 2018-07-27: 09:00:00 2 g via INTRAVENOUS

## 2018-07-27 MED ORDER — HYDROMORPHONE HCL 1 MG/ML IJ SOLN
INTRAMUSCULAR | Status: AC
  Filled 2018-07-27: qty 0.5

## 2018-07-27 MED ORDER — CHLORHEXIDINE GLUCONATE 4 % EX LIQD: 60.0000 mL | Freq: Once | CUTANEOUS | Status: DC

## 2018-07-27 MED ORDER — FENTANYL CITRATE (PF) 100 MCG/2ML IJ SOLN
INTRAMUSCULAR | Status: AC
  Filled 2018-07-27: qty 2

## 2018-07-27 MED ORDER — HYDROMORPHONE HCL 2 MG PO TABS: 2.0000 mg | ORAL_TABLET | Freq: Four times a day (QID) | ORAL | 0 refills | Status: DC | PRN

## 2018-07-27 MED ORDER — CEFAZOLIN SODIUM-DEXTROSE 2-4 GM/100ML-% IV SOLN
INTRAVENOUS | Status: AC
  Filled 2018-07-27: qty 100

## 2018-07-27 MED ORDER — ONDANSETRON HCL 4 MG/2ML IJ SOLN
INTRAMUSCULAR | Status: AC
  Filled 2018-07-27: qty 2

## 2018-07-27 MED ORDER — LIDOCAINE HCL (PF) 1 % IJ SOLN
INTRAMUSCULAR | Status: AC
  Filled 2018-07-27: qty 30

## 2018-07-27 MED ORDER — BUPIVACAINE HCL (PF) 0.25 % IJ SOLN
INTRAMUSCULAR | Status: AC
  Filled 2018-07-27: qty 30

## 2018-07-27 MED ORDER — PROMETHAZINE HCL 12.5 MG PO TABS: 25.0000 mg | ORAL_TABLET | Freq: Four times a day (QID) | ORAL | 0 refills | Status: DC | PRN

## 2018-07-27 MED ORDER — PROPOFOL 10 MG/ML IV BOLUS
INTRAVENOUS | Status: AC
  Filled 2018-07-27: qty 40

## 2018-07-27 MED ORDER — ONDANSETRON HCL 4 MG/2ML IJ SOLN
INTRAMUSCULAR | Status: DC | PRN
  Administered 2018-07-27: 4 mg via INTRAVENOUS

## 2018-07-27 MED ORDER — MIDAZOLAM HCL 2 MG/2ML IJ SOLN
INTRAMUSCULAR | Status: AC
  Filled 2018-07-27: qty 2

## 2018-07-27 SURGICAL SUPPLY — 57 items
BANDAGE ACE 3X5.8 VEL STRL LF (GAUZE/BANDAGES/DRESSINGS) IMPLANT
BANDAGE COBAN LF 1.5X5 NS (GAUZE/BANDAGES/DRESSINGS) IMPLANT
BLADE SURG 15 STRL LF DISP TIS (BLADE) ×2 IMPLANT
BLADE SURG 15 STRL SS (BLADE) ×2
BNDG COHESIVE 2X5 TAN STRL LF (GAUZE/BANDAGES/DRESSINGS) IMPLANT
BNDG COHESIVE 3X5 TAN NS (GAUZE/BANDAGES/DRESSINGS) ×2 IMPLANT
BNDG COHESIVE 3X5 TAN STRL LF (GAUZE/BANDAGES/DRESSINGS) IMPLANT
BNDG CONFORM 3 STRL LF (GAUZE/BANDAGES/DRESSINGS) ×2 IMPLANT
BNDG GAUZE ELAST 4 BULKY (GAUZE/BANDAGES/DRESSINGS) ×2 IMPLANT
BRUSH SCRUB EZ PLAIN DRY (MISCELLANEOUS) ×2 IMPLANT
CORD BIPOLAR FORCEPS 12FT (ELECTRODE) ×2 IMPLANT
COVER BACK TABLE REUSABLE LG (DRAPES) ×2 IMPLANT
COVER WAND RF STERILE (DRAPES) IMPLANT
CUFF TOURN SGL QUICK 18X4 (TOURNIQUET CUFF) IMPLANT
DECANTER SPIKE VIAL GLASS SM (MISCELLANEOUS) IMPLANT
DRAPE EXTREMITY T 121X128X90 (DISPOSABLE) ×2 IMPLANT
DRAPE SURG 17X23 STRL (DRAPES) ×2 IMPLANT
DRSG EMULSION OIL 3X3 NADH (GAUZE/BANDAGES/DRESSINGS) ×2 IMPLANT
GAUZE SPONGE 4X4 12PLY STRL (GAUZE/BANDAGES/DRESSINGS) ×2 IMPLANT
GAUZE XEROFORM 1X8 LF (GAUZE/BANDAGES/DRESSINGS) ×2 IMPLANT
GLOVE BIOGEL M STRL SZ7.5 (GLOVE) IMPLANT
GLOVE SS BIOGEL STRL SZ 8 (GLOVE) ×1 IMPLANT
GLOVE SUPERSENSE BIOGEL SZ 8 (GLOVE) ×1
GOWN STRL REUS W/ TWL LRG LVL3 (GOWN DISPOSABLE) ×1 IMPLANT
GOWN STRL REUS W/ TWL XL LVL3 (GOWN DISPOSABLE) ×1 IMPLANT
GOWN STRL REUS W/TWL LRG LVL3 (GOWN DISPOSABLE) ×1
GOWN STRL REUS W/TWL XL LVL3 (GOWN DISPOSABLE) ×1
LOOP VESSEL MAXI BLUE (MISCELLANEOUS) IMPLANT
NEEDLE HYPO 22GX1.5 SAFETY (NEEDLE) IMPLANT
NEEDLE HYPO 25X1 1.5 SAFETY (NEEDLE) ×2 IMPLANT
NS IRRIG 1000ML POUR BTL (IV SOLUTION) ×2 IMPLANT
PACK BASIN DAY SURGERY FS (CUSTOM PROCEDURE TRAY) ×2 IMPLANT
PAD ALCOHOL SWAB (MISCELLANEOUS) IMPLANT
PAD CAST 3X4 CTTN HI CHSV (CAST SUPPLIES) IMPLANT
PADDING CAST ABS 3INX4YD NS (CAST SUPPLIES)
PADDING CAST ABS 4INX4YD NS (CAST SUPPLIES)
PADDING CAST ABS COTTON 3X4 (CAST SUPPLIES) IMPLANT
PADDING CAST ABS COTTON 4X4 ST (CAST SUPPLIES) IMPLANT
PADDING CAST COTTON 3X4 STRL (CAST SUPPLIES)
SHEET MEDIUM DRAPE 40X70 STRL (DRAPES) ×2 IMPLANT
SLING ARM FOAM STRAP LRG (SOFTGOODS) ×2 IMPLANT
SPLINT FIBERGLASS 3X35 (CAST SUPPLIES) IMPLANT
SPLINT FINGER 5.25 BULB (SOFTGOODS) ×2 IMPLANT
SPLINT PLASTER CAST XFAST 3X15 (CAST SUPPLIES) IMPLANT
SPLINT PLASTER CAST XFAST 4X15 (CAST SUPPLIES) IMPLANT
SPLINT PLASTER XTRA FAST SET 4 (CAST SUPPLIES)
SPLINT PLASTER XTRA FASTSET 3X (CAST SUPPLIES)
STOCKINETTE 4X48 STRL (DRAPES) ×2 IMPLANT
STOCKINETTE SYNTHETIC 3 UNSTER (CAST SUPPLIES) IMPLANT
STOCKINETTE SYNTHETIC 4 NONSTR (MISCELLANEOUS) IMPLANT
STRIP CLOSURE SKIN 1/2X4 (GAUZE/BANDAGES/DRESSINGS) IMPLANT
SUT PROLENE 4 0 PS 2 18 (SUTURE) ×2 IMPLANT
SUT PROLENE 5 0 P 3 (SUTURE) IMPLANT
SYR BULB 3OZ (MISCELLANEOUS) ×2 IMPLANT
SYR CONTROL 10ML LL (SYRINGE) ×2 IMPLANT
TOWEL GREEN STERILE FF (TOWEL DISPOSABLE) ×2 IMPLANT
UNDERPAD 30X30 (UNDERPADS AND DIAPERS) ×2 IMPLANT

## 2018-07-27 NOTE — H&P (Signed)
Lisa Long is an 53 y.o. female.   Chief Complaint:left middle finger ischemis HPI: Patient presents for evaluation and treatment of the of their upper extremity predicament. The patient denies neck, back, chest or  abdominal pain. The patient notes that they have no lower extremity problems. The patients primary complaint is noted. We are planning surgical care pathway for the upper extremity.  Past Medical History:  Diagnosis Date  . Cervical spondylosis without myelopathy   . CREST (calcinosis, Raynaud's phenomenon, esophageal dysfunction, sclerodactyly, telangiectasia) ([place])   . CREST syndrome ([place])   . Esophageal stricture - GERD 07/23/2014  . GERD (gastroesophageal reflux disease)   . Hx of adenomatous polyp of colon 10/14/2014  . Hypertension   . IBS (irritable bowel syndrome)   . Raynaud disease   . Raynaud's disease, idiopathic   . Seasonal allergies   . Spinal stenosis   . Tobacco use   . Vitamin B12 deficiency     Past Surgical History:  Procedure Laterality Date  . 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 AMPUTATION Right 05/31/2017   Procedure: Right middle finger amputation/revision amputation at distal interphalangeal versus proximal interphalangeal with repair reconstruction as needed;  Surgeon: Roseanne Kaufman, MD;  Location: [place];  Service: Orthopedics;  Laterality: Right;  60 mins  . BREAST SURGERY     benign breast cysts  . CERVICAL FUSION    . COLONOSCOPY    . DERMOID CYST  EXCISION    . ESOPHAGOGASTRODUODENOSCOPY (EGD) WITH ESOPHAGEAL DILATION  2016  . 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 HEMORRHOID SURGERY    . IR GENERIC HISTORICAL  01/29/2016   IR ANGIOGRAM EXTREMITY LEFT 01/29/2016 [name], MD [facility]  . IR GENERIC HISTORICAL  01/29/2016   IR US GUIDE VASC ACCESS RIGHT 01/29/2016  [name], MD [facility]  . NAILBED REPAIR Right 01/30/2013   Procedure: BIOPSY OF RIGHT LONG NAIL FOLD;  Surgeon: [name]., MD;  Location: [place];  Service: Orthopedics;  Laterality: Right;  Penrose used as tourniquet--on at 0944, off at 1001.  . OOPHORECTOMY     Right ovary removal only  . SYMPATHECTOMY Right 01/30/2015   Procedure: DIGITAL SYMPATHECTOMY, RIGHT RADIAL ARTERY, ULNAR ARTERY, ARCH DIGITAL VESSELS INDEX MIDDLE RING SMALL GRAFT ULNAR ARTERY ;  Surgeon: [name], MD;  Location: [place];  Service: Orthopedics;  Laterality: Right;  . SYMPATHECTOMY Left 03/04/2016   Procedure: Digital SYMPATHECTOMY, left radial, ulnar, arch and common digital times 3;  Surgeon: [name], MD;  Location: [place];  Service: Orthopedics;  Laterality: Left;  Digital SYMPATHECTOMY, left radial, ulnar, arch and common digital times 3    Family History  Problem Relation Age of Onset  . ALS Mother   . Diabetes Other   . Hypertension Other   . Stroke Other        F 1st degree relative <60  . Colon cancer Neg Hx   . Esophageal cancer Neg Hx   . Rectal cancer Neg Hx   . Stomach cancer Neg Hx    Social History:  reports that she has quit smoking. Her smoking use included cigarettes. She has a 12.50 pack-year smoking history. She has never used smokeless tobacco. She reports current alcohol use of about 20.0 standard drinks of alcohol  per week. She reports that she does not use drugs.  Allergies: No Known Allergies  Medications Prior to Admission  Medication Sig Dispense Refill  . acetaminophen (TYLENOL) 325 MG tablet Take 325-650 mg by mouth 2 (two) times daily as needed (for pain.).    [name] amLODipine (NORVASC) 10 MG tablet Take 10 mg by mouth daily.    [name] esomeprazole (NEXIUM) 40 MG capsule Take 1 capsule (40 mg total) by mouth 2 (two) times daily before a meal. 180 capsule 3  . HYDROcodone-acetaminophen (NORCO) 7.5-325 MG tablet Take  1 tablet by mouth every 6 (six) hours as needed. for pain  0    No results found for this or any previous visit (from the past 48 hour(s)). No results found.  Review of Systems  Respiratory: Negative.   Gastrointestinal: Negative.   Genitourinary: Negative.     Blood pressure (!) 133/92, pulse (!) 111, temperature 98.2 F (36.8 C), temperature source Oral, resp. rate 16, height 5\' 8"  (1.727 m), weight 48.6 kg, last menstrual period 01/28/2010, SpO2 97 %. Physical Exam  Left middle finger ischemia plan for amputaion of nonviable parts The patient is alert and oriented in no acute distress. The patient complains of pain in the affected upper extremity.  The patient is noted to have a normal HEENT exam. Lung fields show equal chest expansion and no shortness of breath. Abdomen exam is nontender without distention. Lower extremity examination does not show any fracture dislocation or blood clot symptoms. Pelvis is stable and the neck and back are stable and nontender. Assessment/Plan Plan left middle finger amputation due to ischemia  We are planning surgery for your upper extremity. The risk and benefits of surgery to include risk of bleeding, infection, anesthesia,  damage to normal structures and failure of the surgery to accomplish its intended goals of relieving symptoms and restoring function have been discussed in detail. With this in mind we plan to proceed. I have specifically discussed with the patient the pre-and postoperative regime and the dos and don'ts and risk and benefits in great detail. Risk and benefits of surgery also include risk of dystrophy(CRPS), chronic nerve pain, failure of the healing process to go onto completion and other inherent risks of surgery The relavent the pathophysiology of the disease/injury process, as well as the alternatives for treatment and postoperative course of action has been discussed in great detail with the patient who desires to  proceed.  We will do everything in our power to help you (the patient) restore function to the upper extremity. It is a pleasure to see this patient today.   Willa Frater III, MD 07/27/2018, 8:52 AM

## 2018-07-27 NOTE — Discharge Instructions (Signed)
Keep bandage clean and dry.  Call for any problems.  No smoking.  Criteria for driving a car: you should be off your pain medicine for 7-8 hours, able to drive one handed(confident), thinking clearly and feeling able in your judgement to drive. Continue elevation as it will decrease swelling.  If instructed by MD move your fingers within the confines of the bandage/splint.  Use ice if instructed by your MD. Call immediately for any sudden loss of feeling in your hand/arm or change in functional abilities of the extremity.We recommend that you to take vitamin C 1000 mg a day to promote healing. We also recommend that if you require  pain medicine that you take a stool softener to prevent constipation as most pain medicines will have constipation side effects. We recommend either Peri-Colace or Senokot and recommend that you also consider adding MiraLAX as well to prevent the constipation affects from pain medicine if you are required to use them. These medicines are over the counter and may be purchased at a local pharmacy. A cup of yogurt and a probiotic can also be helpful during the recovery process as the medicines can disrupt your intestinal environment.  NO IBUPROFEN/MOTRIN/ALEVE UNTIL AFTER 5:30PM TODAY.   Post Anesthesia Home Care Instructions  Activity: Get plenty of rest for the remainder of the day. A responsible individual must stay with you for 24 hours following the procedure.  For the next 24 hours, DO NOT: -Drive a car -Paediatric nurse -Drink alcoholic beverages -Take any medication unless instructed by your physician -Make any legal decisions or sign important papers.  Meals: Start with liquid foods such as gelatin or soup. Progress to regular foods as tolerated. Avoid greasy, spicy, heavy foods. If nausea and/or vomiting occur, drink only clear liquids until the nausea and/or vomiting subsides. Call your physician if vomiting continues.  Special Instructions/Symptoms: Your  throat may feel dry or sore from the anesthesia or the breathing tube placed in your throat during surgery. If this causes discomfort, gargle with warm salt water. The discomfort should disappear within 24 hours.  If you had a scopolamine patch placed behind your ear for the management of post- operative nausea and/or vomiting:  1. The medication in the patch is effective for 72 hours, after which it should be removed.  Wrap patch in a tissue and discard in the trash. Wash hands thoroughly with soap and water. 2. You may remove the patch earlier than 72 hours if you experience unpleasant side effects which may include dry mouth, dizziness or visual disturbances. 3. Avoid touching the patch. Wash your hands with soap and water after contact with the patch.

## 2018-07-27 NOTE — Anesthesia Preprocedure Evaluation (Signed)
Anesthesia Evaluation  Patient identified by MRN, date of birth, ID band Patient awake    Reviewed: Allergy & Precautions, NPO status , Patient's Chart, lab work & pertinent test results  History of Anesthesia Complications Negative for: history of anesthetic complications  Airway Mallampati: III  TM Distance: >3 FB Neck ROM: Full    Dental  (+) Teeth Intact   Pulmonary neg shortness of breath, neg COPD, neg recent URI, former smoker,    breath sounds clear to auscultation       Cardiovascular hypertension, Pt. on medications + Peripheral Vascular Disease   Rhythm:Regular     Neuro/Psych negative neurological ROS  negative psych ROS   GI/Hepatic GERD  Medicated and Controlled,(+) Hepatitis -  Endo/Other  negative endocrine ROS  Renal/GU negative Renal ROS     Musculoskeletal   Abdominal   Peds  Hematology negative hematology ROS (+)   Anesthesia Other Findings   Reproductive/Obstetrics                             Anesthesia Physical  Anesthesia Plan  ASA: III  Anesthesia Plan: General   Post-op Pain Management:    Induction: Intravenous  PONV Risk Score and Plan: 3 and Ondansetron, Dexamethasone and Midazolam  Airway Management Planned: LMA  Additional Equipment: None  Intra-op Plan:   Post-operative Plan: Extubation in OR  Informed Consent: I have reviewed the patients History and Physical, chart, labs and discussed the procedure including the risks, benefits and alternatives for the proposed anesthesia with the patient or authorized representative who has indicated his/her understanding and acceptance.     Dental advisory given  Plan Discussed with: CRNA and Surgeon  Anesthesia Plan Comments:         Anesthesia Quick Evaluation

## 2018-07-27 NOTE — Transfer of Care (Signed)
Immediate Anesthesia Transfer of Care Note  Patient: Lisa Long  Procedure(s) Performed: Left middle finger amputation with bilateral neurectomies (Left Hand)  Patient Location: PACU  Anesthesia Type:General  Level of Consciousness: awake, alert  and oriented  Airway & Oxygen Therapy: Patient Spontanous Breathing and Patient connected to face mask oxygen  Post-op Assessment: Report given to RN and Post -op Vital signs reviewed and stable  Post vital signs: Reviewed and stable  Last Vitals:  Vitals Value Taken Time  BP    Temp    Pulse    Resp    SpO2      Last Pain:  Vitals:   07/27/18 0725  TempSrc: Oral  PainSc: 9       Patients Stated Pain Goal: 5 (87/57/97 2820)  Complications: No apparent anesthesia complications

## 2018-07-27 NOTE — Op Note (Signed)
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  She presents for the above-mentioned reconstruction and understands risk and benefits.  Operative procedure: Patient was seen by myself and anesthesia taken to the operative theater and underwent smooth induction of general anesthesia.  She was prepped with a Hibiclens pre-scrub followed by 10-minute surgical Betadine scrub and paint.  Once this was performed outlined marks were made preoperative antibiotics were given and timeout was observed.  The operation commenced with tourniquet insufflation followed by fishmouth incision about the finger dissection was carried down the radial and ulnar digital nerves were identified clamped and held.  Following this I then identified volarly the FDP tendon which underwent tenotomy followed by volar plate release.  I then disarticulated the volar aspect of the DIP joint and then swung the fishmouth incision proximally.  I removed the extensor tendon and performed a tenotomy here followed by disarticulation of the DIP joint.  The nail bed and sterile and germinal matrix were in the amputated portion.  There were elements of dry and wet gangrene.  I then performed a crushing and cauterization technique of the nerves to allow for bilateral neurectomies.  I then deflated the tourniquet and performed irrigation with 1 to 2 L of saline followed by closure of the wound with 5-0 Prolene.  The wound closed quite nicely and there were no complicating features.  10 cc of Sensorcaine without  epinephrine were placed in the wound for postop analgesia.  Patient will be discharged home on Dilaudid, Phenergan, and Keflex to be taken as directed.  I had a long conversation with her husband in regards to how to take the pain medicine and also discussed with the patient and her husband that she should take the pain medicine I give and then resume her regular pain management from the pain doctors outside of [place].  They are all well aware of my instructions.  I will see her back in 14 days elevate and keep the area clean and dry.  It was a pleasure to see her today.  Ayaz Sondgeroth MD

## 2018-07-27 NOTE — Anesthesia Procedure Notes (Signed)
Procedure Name: LMA Insertion Date/Time: 07/27/2018 9:03 AM Performed by: Verita Lamb, CRNA Pre-anesthesia Checklist: Patient identified, Emergency Drugs available, Suction available and Patient being monitored Patient Re-evaluated:Patient Re-evaluated prior to induction Oxygen Delivery Method: Circle system utilized Preoxygenation: Pre-oxygenation with 100% oxygen Induction Type: IV induction Ventilation: Mask ventilation without difficulty LMA: LMA inserted LMA Size: 4.0 and 3.0 Number of attempts: 1 Airway Equipment and Method: Bite block Placement Confirmation: positive ETCO2 Tube secured with: Tape Dental Injury: Teeth and Oropharynx as per pre-operative assessment

## 2018-07-27 NOTE — Anesthesia Postprocedure Evaluation (Signed)
Anesthesia Post Note  Patient: Lisa Long  Procedure(s) Performed: Left middle finger amputation with bilateral neurectomies (Left Hand)     Patient location during evaluation: PACU Anesthesia Type: General Level of consciousness: awake and alert Pain management: pain level controlled Vital Signs Assessment: post-procedure vital signs reviewed and stable Respiratory status: spontaneous breathing, nonlabored ventilation and respiratory function stable Cardiovascular status: blood pressure returned to baseline and stable Postop Assessment: no apparent nausea or vomiting Anesthetic complications: no    Last Vitals:  Vitals:   07/27/18 1030 07/27/18 1045  BP: 124/86 122/87  Pulse: 85 90  Resp: 10 (!) 21  Temp:    SpO2: 98% 95%    Last Pain:  Vitals:   07/27/18 1115  TempSrc:   PainSc: [place]

## 2018-07-28 ENCOUNTER — Encounter (HOSPITAL_BASED_OUTPATIENT_CLINIC_OR_DEPARTMENT_OTHER): Payer: Self-pay | Admitting: Orthopedic Surgery

## 2018-08-07 MED FILL — PROMETHAZINE 25 MG TABLET: 25 | 3 days supply | Qty: 20 | Fill #0

## 2018-08-07 MED FILL — HYDROmorphone HCL 2 MG TABS: 2 | 7 days supply | Qty: 42 | Fill #0

## 2018-08-16 MED FILL — SULFAMETHOXAZOLE-TMP DS TAB: 800-160 | 14 days supply | Qty: 28 | Fill #0

## 2018-08-16 MED FILL — PROMETHAZINE 25 MG TABLET: 25 | 20 days supply | Qty: 20 | Fill #0

## 2018-09-22 DIAGNOSIS — M25512 Pain in left shoulder: Secondary | ICD-10-CM | POA: Diagnosis not present

## 2018-09-22 DIAGNOSIS — M341 CR(E)ST syndrome: Secondary | ICD-10-CM | POA: Diagnosis not present

## 2018-09-22 DIAGNOSIS — I998 Other disorder of circulatory system: Secondary | ICD-10-CM | POA: Diagnosis not present

## 2018-09-22 DIAGNOSIS — M87042 Idiopathic aseptic necrosis of left hand: Secondary | ICD-10-CM | POA: Diagnosis not present

## 2018-09-22 MED FILL — DOXYCYCLINE HYC 100 MG CAPS: 100 | 14 days supply | Qty: 28 | Fill #0

## 2018-09-22 MED FILL — oxyCODONE HCL 5 MG TABS: 5 | 10 days supply | Qty: 60 | Fill #0

## 2018-11-15 ENCOUNTER — Telehealth: Payer: Self-pay

## 2018-11-15 NOTE — Telephone Encounter (Signed)
We received a fax from [facility] that they have been trying to reach her and unable to do so. I called her mobile # and got voice mail. I left her a detailed message and told her to please call me back so I can update her info and give her the [facility] # .

## 2018-11-16 NOTE — Telephone Encounter (Signed)
I tried to reach her at work # we had-[employer] and they said she had been gone almost a year.

## 2018-11-17 NOTE — Telephone Encounter (Signed)
Since I have been unable to reach [place] I have mailed her a letter along with a copy of the letter from [facility]. Hopefully she will call them back to get her appointment r/s'ed. There # is [phone].

## 2018-11-24 ENCOUNTER — Telehealth: Payer: Self-pay

## 2018-11-24 DIAGNOSIS — R1310 Dysphagia, unspecified: Secondary | ICD-10-CM

## 2018-11-24 NOTE — Telephone Encounter (Signed)
Patient has been set up for EGD in [place] with DIL. Reviewed her medicines. She is [facility] active so she will see the instructions there. Also we went over them verbally on the phone.

## 2018-11-24 NOTE — Telephone Encounter (Signed)
Yes  Let's set her up for an EGD and dilation in [place]

## 2018-11-24 NOTE — Telephone Encounter (Signed)
Lisa Long called in and said that [facility] has cancelled multiple times with her due to [place]. She has tried calling them multiple times and leaves messages. She is waiting on a call back.  She said she is getting worst and having bad dysphagia , mostly with solids and liquids are painful.   She wonders if Dr Carlean Purl could do an EGD/DIL on her. She said she hasn't slept in a long time due to this problem.  Please advise Sir?

## 2018-11-27 ENCOUNTER — Telehealth: Payer: Self-pay

## 2018-11-27 NOTE — Telephone Encounter (Signed)
Patient answered "no" to all questions.  

## 2018-11-27 NOTE — Telephone Encounter (Signed)
Covid-19 screening questions   Do you now or have you had a fever in the last 14 days?  Do you have any respiratory symptoms of shortness of breath or cough now or in the last 14 days?  Do you have any family members or close contacts with diagnosed or suspected Covid-19 in the past 14 days?  Have you been tested for Covid-19 and found to be positive?     Unable to l/m call will not go through. Will try back.

## 2018-11-28 ENCOUNTER — Other Ambulatory Visit: Payer: Self-pay

## 2018-11-28 ENCOUNTER — Encounter: Payer: Self-pay | Admitting: Internal Medicine

## 2018-11-28 ENCOUNTER — Ambulatory Visit (AMBULATORY_SURGERY_CENTER): Payer: BC Managed Care – PPO | Admitting: Internal Medicine

## 2018-11-28 VITALS — BP 137/92 | HR 96 | Temp 98.7°F | Resp 12 | Ht 67.0 in | Wt 110.0 lb

## 2018-11-28 DIAGNOSIS — K2210 Ulcer of esophagus without bleeding: Secondary | ICD-10-CM | POA: Diagnosis not present

## 2018-11-28 DIAGNOSIS — K222 Esophageal obstruction: Secondary | ICD-10-CM

## 2018-11-28 DIAGNOSIS — K219 Gastro-esophageal reflux disease without esophagitis: Secondary | ICD-10-CM

## 2018-11-28 DIAGNOSIS — K210 Gastro-esophageal reflux disease with esophagitis: Secondary | ICD-10-CM | POA: Diagnosis not present

## 2018-11-28 DIAGNOSIS — K449 Diaphragmatic hernia without obstruction or gangrene: Secondary | ICD-10-CM

## 2018-11-28 DIAGNOSIS — R1310 Dysphagia, unspecified: Secondary | ICD-10-CM

## 2018-11-28 DIAGNOSIS — K208 Other esophagitis: Secondary | ICD-10-CM | POA: Diagnosis not present

## 2018-11-28 MED ORDER — METOCLOPRAMIDE HCL 10 MG PO TABS: 10.0000 mg | ORAL_TABLET | Freq: Three times a day (TID) | ORAL | 0 refills | Status: DC

## 2018-11-28 MED ORDER — SODIUM CHLORIDE 0.9 % IV SOLN: 500.0000 mL | Freq: Once | INTRAVENOUS | Status: DC

## 2018-11-28 NOTE — Patient Instructions (Addendum)
The esophagus is still inflamed.I dilated again.  I have some ideas.  1) Must elevate the bed or buy a wedge. Propping on pillows will not work. 2) Try metaclopramide 10 mg before meals and at bedtime but start just at bedtime and see if that helps. Rx sent 3) I will think about this more and also communicate with Dr. Flonnie Overman 4) I took biopsies - not expecting anything bad but checking for infection. 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  I will be in touch by next week.  Follow an antireflux regimen.  This includes:      - Do not lie down for at least 3 to 4 hours after meals.       - Raise the head of the bed 4 to 6 inches.       - Decrease excess weight.       - Avoid citrus juices and other acidic foods, alcohol, chocolate, mints, coffee and other caffeinated beverages, carbonated beverages, fatty and fried foods.       - Avoid tight-fitting clothing.       - Avoid cigarettes and other tobacco products.     I appreciate the opportunity to care for you. Gatha Mayer, MD, [facility]  Please read handouts provided. Antireflux regimen. Continue present medications. Follow Dilation Diet. Trial of Metaclopramice 10 mg,  start tonight and add as needed to max three times a day before meals.     YOU HAD AN ENDOSCOPIC PROCEDURE TODAY AT [facility]:   Refer to the procedure report that was given to you for any specific questions about what was found during the examination.  If the procedure report does not answer your questions, please call your gastroenterologist to clarify.  If you requested that your care partner not be given the details of your procedure findings, then the procedure report has been included in a sealed envelope for you to review at your convenience later.  YOU SHOULD EXPECT: Some feelings of bloating in the abdomen. Passage of more gas than usual.  Walking can help get rid of the air that was  put into your GI tract during the procedure and reduce the bloating. If you had a lower endoscopy (such as a colonoscopy or flexible sigmoidoscopy) you may notice spotting of blood in your stool or on the toilet paper. If you underwent a bowel prep for your procedure, you may not have a normal bowel movement for a few days.  Please Note:  You might notice some irritation and congestion in your nose or some drainage.  This is from the oxygen used during your procedure.  There is no need for concern and it should clear up in a day or so.  SYMPTOMS TO REPORT IMMEDIATELY:   Following upper endoscopy (EGD)  Vomiting of blood or coffee ground material  New chest pain or pain under the shoulder blades  Painful or persistently difficult swallowing  New shortness of breath  Fever of 100F or higher  Black, tarry-looking stools  For urgent or emergent issues, a gastroenterologist can be reached at any hour by calling [phone].   DIET:   Drink plenty of fluids but you should avoid alcoholic beverages for 24 hours.  ACTIVITY:  You should plan to take it easy for the rest of today and you should NOT DRIVE or use heavy machinery until tomorrow (because of the sedation medicines used during the test).  FOLLOW UP: Our staff will call the number listed on your records 48-72 hours following your procedure to check on you and address any questions or concerns that you may have regarding the information given to you following your procedure. If we do not reach you, we will leave a message.  We will attempt to reach you two times.  During this call, we will ask if you have developed any symptoms of COVID 19. If you develop any symptoms (ie: fever, flu-like symptoms, shortness of breath, cough etc.) before then, please call [phone].  If you test positive for Covid 19 in the 2 weeks post procedure, please call and report this information to [place].    If any biopsies were taken you will be contacted by  phone or by letter within the next 1-3 weeks.  Please call us at [phone] if you have not heard about the biopsies in 3 weeks.    SIGNATURES/CONFIDENTIALITY: You and/or your care partner have signed paperwork which will be entered into your electronic medical record.  These signatures attest to the fact that that the information above on your After Visit Summary has been reviewed and is understood.  Full responsibility of the confidentiality of this discharge information lies with you and/or your care-partner.

## 2018-11-28 NOTE — Progress Notes (Signed)
Report to PACU, RN, vss, BBS= Clear.  

## 2018-11-28 NOTE — Op Note (Signed)
[facility] Patient Name: Lisa Long Procedure Date: 11/28/2018 9:16 AM MRN: [id number] Endoscopist: Gatha Mayer , MD Age: 53 Referring MD:  Date of Birth: [birth year] Gender: Female Account #: [id number] Procedure:                Upper GI endoscopy Indications:              Dysphagia, Heartburn Medicines:                Propofol per Anesthesia, Monitored Anesthesia Care Procedure:                Pre-Anesthesia Assessment:                           - Prior to the procedure, a History and Physical                            was performed, and patient medications and                            allergies were reviewed. The patient's tolerance of                            previous anesthesia was also reviewed. The risks                            and benefits of the procedure and the sedation                            options and risks were discussed with the patient.                            All questions were answered, and informed consent                            was obtained. Prior Anticoagulants: The patient has                            taken no previous anticoagulant or antiplatelet                            agents. ASA Grade Assessment: III - A patient with                            severe systemic disease. After reviewing the risks                            and benefits, the patient was deemed in                            satisfactory condition to undergo the procedure.                           After obtaining informed consent, the endoscope was  passed under direct vision. Throughout the                            procedure, the patient's blood pressure, pulse, and                            oxygen saturations were monitored continuously. The                            Endoscope was introduced through the mouth, and                            advanced to the second part of duodenum. The upper                            GI  endoscopy was accomplished without difficulty.                            The patient tolerated the procedure well. Scope In: Scope Out: Findings:                 LA Grade C (one or more mucosal breaks continuous                            between tops of 2 or more mucosal folds, less than                            75% circumference) esophagitis with no bleeding was                            found 31 to 36 cm from the incisors. Biopsies were                            taken with a cold forceps for histology.                            Verification of patient identification for the                            specimen was done. Estimated blood loss was minimal.                           One benign-appearing, intrinsic moderate stenosis                            was found 36 cm from the incisors. This stenosis                            measured less than one cm (in length). The stenosis                            was traversed. A TTS dilator was passed through the  scope. Dilation with an 18-19-20 mm balloon dilator                            was performed to 20 mm. The dilation site was                            examined and showed no change. Estimated blood                            loss: none. Biopsies were taken with a cold forceps                            for histology. Verification of patient                            identification for the specimen was done. Estimated                            blood loss was minimal.                           A 2 cm hiatal hernia was present.                           The exam was otherwise without abnormality.                           The cardia and gastric fundus were normal on                            retroflexion. Complications:            No immediate complications. Estimated Blood Loss:     Estimated blood loss was minimal. Impression:               - LA Grade C reflux esophagitis. Biopsie                            - Benign-appearing esophageal stenosis. Dilated.                            Biopsied.                           - 2 cm hiatal hernia.                           - The examination was otherwise normal. Recommendation:           - Patient has a contact number available for                            emergencies. The signs and symptoms of potential                            delayed complications were discussed with the  patient. Return to normal activities tomorrow.                            Written discharge instructions were provided to the                            patient.                           - Clear liquids x 1 hour then soft foods rest of                            day. Start prior diet tomorrow.                           - Continue present medications.                           - MUST ELEVATE HEAD OF BED AS OPPOSED TO PILLOWS                           TRIAL OF METACLOPRAMICE 10 MG - START HS AND ADD AS                            NEEDED TO MAX TID AC HAS                           WILL THINK ABOUT OTHER OPTIONS AND COORDINATE WITH                            DR. Flonnie Overman AT [facility]                           SHE TRIED CARAFATE BUT CAUSES ABDOMINAL PAIN                           - Follow an antireflux regimen. Gatha Mayer, MD 11/28/2018 10:10:43 AM This report has been signed electronically.

## 2018-11-28 NOTE — Progress Notes (Signed)
CW vitals and MB temps. SM

## 2018-11-28 NOTE — Progress Notes (Signed)
Called to room to assist during endoscopic procedure.  Patient ID and intended procedure confirmed with present staff. Received instructions for my participation in the procedure from the performing physician.  

## 2018-11-30 ENCOUNTER — Telehealth: Payer: Self-pay | Admitting: *Deleted

## 2018-11-30 ENCOUNTER — Telehealth: Payer: Self-pay

## 2018-11-30 NOTE — Telephone Encounter (Signed)
  Follow up Call-  Call back number 11/28/2018 03/08/2018  Post procedure Call Back phone  # [phone]  Permission to leave phone message Yes Yes  Some recent data might be hidden    LMOM to call back if she has any questions or concerns; also if she has developed any symptoms of the Covid 19

## 2018-11-30 NOTE — Telephone Encounter (Signed)
NO ANSWER, MESSAGE LEFT FOR PATIENT. 

## 2018-12-04 ENCOUNTER — Encounter: Payer: Self-pay | Admitting: Internal Medicine

## 2018-12-04 ENCOUNTER — Other Ambulatory Visit: Payer: Self-pay | Admitting: Internal Medicine

## 2018-12-04 DIAGNOSIS — B009 Herpesviral infection, unspecified: Secondary | ICD-10-CM

## 2018-12-04 HISTORY — DX: Herpesviral infection, unspecified: B00.9

## 2018-12-04 MED ORDER — FAMCICLOVIR 500 MG PO TABS: 500.0000 mg | ORAL_TABLET | Freq: Three times a day (TID) | ORAL | 0 refills | Status: AC

## 2018-12-04 NOTE — Progress Notes (Signed)
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  Please keep taking the esomeprazole twice a day and you may use the metaclopramide as needed.  Best regards,  Gatha Mayer, MD, [facility]

## 2018-12-04 NOTE — Progress Notes (Signed)
No letter I sent My Chart message and also called her

## 2018-12-21 DIAGNOSIS — Z79899 Other long term (current) drug therapy: Secondary | ICD-10-CM | POA: Diagnosis not present

## 2019-02-19 DIAGNOSIS — M79641 Pain in right hand: Secondary | ICD-10-CM | POA: Diagnosis not present

## 2019-02-19 DIAGNOSIS — I998 Other disorder of circulatory system: Secondary | ICD-10-CM | POA: Diagnosis not present

## 2019-02-19 DIAGNOSIS — M341 CR(E)ST syndrome: Secondary | ICD-10-CM | POA: Diagnosis not present

## 2019-03-13 DIAGNOSIS — L03011 Cellulitis of right finger: Secondary | ICD-10-CM | POA: Diagnosis not present

## 2019-03-13 DIAGNOSIS — M79644 Pain in right finger(s): Secondary | ICD-10-CM | POA: Diagnosis not present

## 2019-03-13 DIAGNOSIS — T8789 Other complications of amputation stump: Secondary | ICD-10-CM | POA: Diagnosis not present

## 2019-03-13 DIAGNOSIS — M341 CR(E)ST syndrome: Secondary | ICD-10-CM | POA: Diagnosis not present

## 2019-04-16 DIAGNOSIS — M79644 Pain in right finger(s): Secondary | ICD-10-CM | POA: Diagnosis not present

## 2019-04-19 ENCOUNTER — Ambulatory Visit: Payer: BC Managed Care – PPO | Admitting: Internal Medicine

## 2019-04-19 ENCOUNTER — Ambulatory Visit (INDEPENDENT_AMBULATORY_CARE_PROVIDER_SITE_OTHER): Payer: BC Managed Care – PPO

## 2019-04-19 ENCOUNTER — Encounter: Payer: Self-pay | Admitting: Internal Medicine

## 2019-04-19 VITALS — BP 98/60 | HR 100 | Temp 98.3°F | Ht 67.0 in | Wt 109.2 lb

## 2019-04-19 DIAGNOSIS — Z1159 Encounter for screening for other viral diseases: Secondary | ICD-10-CM | POA: Diagnosis not present

## 2019-04-19 DIAGNOSIS — B009 Herpesviral infection, unspecified: Secondary | ICD-10-CM | POA: Diagnosis not present

## 2019-04-19 DIAGNOSIS — R1310 Dysphagia, unspecified: Secondary | ICD-10-CM | POA: Diagnosis not present

## 2019-04-19 DIAGNOSIS — R1319 Other dysphagia: Secondary | ICD-10-CM

## 2019-04-19 DIAGNOSIS — Z01818 Encounter for other preprocedural examination: Secondary | ICD-10-CM | POA: Diagnosis not present

## 2019-04-19 LAB — SARS CORONAVIRUS 2 (TAT 6-24 HRS): SARS Coronavirus 2: NEGATIVE

## 2019-04-19 NOTE — Patient Instructions (Signed)
  You have been scheduled for an endoscopy. Please follow written instructions given to you at your visit today. If you use inhalers (even only as needed), please bring them with you on the day of your procedure.   I appreciate the opportunity to care for you. Carl Gessner, MD, FACG 

## 2019-04-19 NOTE — Progress Notes (Signed)
Lisa Long 54 y.o. 1965/10/15 [id number]  Assessment & Plan:   Encounter Diagnoses  Name Primary?  . Odynophagia Yes  . Esophageal dysphagia   . HSV infection of esophagus   . Encounter for other preprocedural examination    Repeat EGD and bx, possible dilation ? Recurrent HSV  If so then would get ID help    Subjective:   Chief Complaint: dysphagia  HPI Did well after 11/2018 dx HSV esophagitis then had development of recurrent odynophagia and dysphagia past few weeks  ROS additional amputations No Known Allergies Current Meds  Medication Sig  . amLODipine (NORVASC) 10 MG tablet Take 10 mg by mouth daily.  [name] esomeprazole (NEXIUM) 40 MG capsule Take 1 capsule (40 mg total) by mouth 2 (two) times daily before a meal.  . HYDROcodone-acetaminophen (NORCO) 7.5-325 MG tablet Take 1 tablet by mouth every 6 (six) hours as needed. for pain   Past Medical History:  Diagnosis Date  . Cervical spondylosis without myelopathy   . CREST (calcinosis, Raynaud's phenomenon, esophageal dysfunction, sclerodactyly, telangiectasia) ([place])   . CREST syndrome ([place])   . Esophageal stricture - GERD 07/23/2014  . GERD (gastroesophageal reflux disease)   . 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 Hx of adenomatous polyp of colon 10/14/2014  . Hypertension   . IBS (irritable bowel syndrome)   . Raynaud disease   . Raynaud's disease, idiopathic   . Seasonal allergies   . Spinal stenosis   . Tobacco use   . Vitamin B12 deficiency    Past Surgical History:  Procedure Laterality Date  . 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 AMPUTATION Right 05/31/2017   Procedure: Right middle finger amputation/revision amputation at distal interphalangeal versus proximal interphalangeal with repair reconstruction as needed;   Surgeon: Roseanne Kaufman, MD;  Location: [place];  Service: Orthopedics;  Laterality: Right;  60 mins  . 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 BREAST SURGERY     benign breast cysts  . CERVICAL FUSION    . COLONOSCOPY    . DERMOID CYST  EXCISION    . ESOPHAGOGASTRODUODENOSCOPY (EGD) WITH ESOPHAGEAL DILATION  2016  . 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 HEMORRHOID SURGERY    . IR GENERIC HISTORICAL  01/29/2016   IR ANGIOGRAM EXTREMITY LEFT 01/29/2016 [name], MD [facility]  . IR GENERIC HISTORICAL  01/29/2016   IR US GUIDE VASC ACCESS RIGHT 01/29/2016 [name], MD [facility]  . NAILBED REPAIR Right 01/30/2013   Procedure: BIOPSY OF RIGHT LONG NAIL FOLD;  Surgeon: [name]., MD;  Location: [place];  Service: Orthopedics;  Laterality: Right;  [place] used as tourniquet--on at 0944, off at 1001.  . OOPHORECTOMY     Right ovary removal only  . SYMPATHECTOMY Right 01/30/2015   Procedure: DIGITAL SYMPATHECTOMY, RIGHT RADIAL ARTERY, ULNAR ARTERY, ARCH DIGITAL VESSELS INDEX MIDDLE RING SMALL GRAFT ULNAR ARTERY ;  Surgeon: [name], MD;  Location: [place];  Service: Orthopedics;  Laterality: Right;  . SYMPATHECTOMY Left 03/04/2016   Procedure: Digital SYMPATHECTOMY, left radial, ulnar, arch and common digital times 3;  Surgeon: Dominica Severin  Fredna Dow, MD;  Location: [place];  Service: Orthopedics;  Laterality: Left;  Digital SYMPATHECTOMY, left radial, ulnar, arch and common digital times 3   Social History   Social History Narrative   Married and no children   Employed at [employer] for many years but 20 18-19 retired due to medical disability   She is a former smoker, 3-4 alcoholic drinks a week, no drug use   family history  includes ALS in her mother; Diabetes in an other family member; Hypertension in an other family member; Stroke in an other family member.   Review of Systems As abobe  Objective:   Physical Exam BP 98/60 (BP Location: Left Arm, Patient Position: Sitting, Cuff Size: Normal)   Pulse 100   Temp 98.3 F (36.8 C)   Ht 5\' 7"  (1.702 m)   Wt 109 lb 4 oz (49.6 kg)   LMP 01/28/2010 (Approximate)   BMI 17.11 kg/m  Thin NAD approp mood and affect Missing some of digits s/p amputation

## 2019-04-20 ENCOUNTER — Encounter: Payer: Self-pay | Admitting: Internal Medicine

## 2019-04-20 ENCOUNTER — Ambulatory Visit (AMBULATORY_SURGERY_CENTER): Payer: BC Managed Care – PPO | Admitting: Internal Medicine

## 2019-04-20 ENCOUNTER — Other Ambulatory Visit (HOSPITAL_COMMUNITY)
Admission: RE | Admit: 2019-04-20 | Discharge: 2019-04-20 | Disposition: A | Discharge status: Home / Self Care | Payer: BC Managed Care – PPO | Source: Ambulatory Visit | Attending: Internal Medicine | Admitting: Internal Medicine

## 2019-04-20 ENCOUNTER — Other Ambulatory Visit: Payer: Self-pay

## 2019-04-20 VITALS — BP 135/95 | HR 89 | Temp 98.4°F | Resp 15 | Ht 67.0 in | Wt 109.0 lb

## 2019-04-20 DIAGNOSIS — K449 Diaphragmatic hernia without obstruction or gangrene: Secondary | ICD-10-CM | POA: Diagnosis not present

## 2019-04-20 DIAGNOSIS — R1310 Dysphagia, unspecified: Secondary | ICD-10-CM | POA: Diagnosis not present

## 2019-04-20 DIAGNOSIS — K222 Esophageal obstruction: Secondary | ICD-10-CM

## 2019-04-20 DIAGNOSIS — K209 Esophagitis, unspecified without bleeding: Secondary | ICD-10-CM | POA: Insufficient documentation

## 2019-04-20 DIAGNOSIS — K2210 Ulcer of esophagus without bleeding: Secondary | ICD-10-CM | POA: Diagnosis not present

## 2019-04-20 DIAGNOSIS — Z8501 Personal history of malignant neoplasm of esophagus: Secondary | ICD-10-CM | POA: Diagnosis not present

## 2019-04-20 DIAGNOSIS — K2100 Gastro-esophageal reflux disease with esophagitis, without bleeding: Secondary | ICD-10-CM | POA: Diagnosis not present

## 2019-04-20 MED ORDER — SODIUM CHLORIDE 0.9 % IV SOLN: 500.0000 mL | Freq: Once | INTRAVENOUS | Status: DC

## 2019-04-20 NOTE — Patient Instructions (Addendum)
Handout given : post dilation diet, hiatal hernia  return to normal activities tomorrow Await pathology results    The esophagus was inflamed and ulcerated again - I took biopsies and dilated and will contact you with results next week.  I appreciate the opportunity to care for you. Gatha Mayer, MD, FACG YOU HAD AN ENDOSCOPIC PROCEDURE TODAY AT [facility]:   Refer to the procedure report that was given to you for any specific questions about what was found during the examination.  If the procedure report does not answer your questions, please call your gastroenterologist to clarify.  If you requested that your care partner not be given the details of your procedure findings, then the procedure report has been included in a sealed envelope for you to review at your convenience later.  YOU SHOULD EXPECT: Some feelings of bloating in the abdomen. Passage of more gas than usual.  Walking can help get rid of the air that was put into your GI tract during the procedure and reduce the bloating. If you had a lower endoscopy (such as a colonoscopy or flexible sigmoidoscopy) you may notice spotting of blood in your stool or on the toilet paper. If you underwent a bowel prep for your procedure, you may not have a normal bowel movement for a few days.  Please Note:  You might notice some irritation and congestion in your nose or some drainage.  This is from the oxygen used during your procedure.  There is no need for concern and it should clear up in a day or so.  SYMPTOMS TO REPORT IMMEDIATELY:    Following upper endoscopy (EGD)  Vomiting of blood or coffee ground material  New chest pain or pain under the shoulder blades  Painful or persistently difficult swallowing  New shortness of breath  Fever of 100F or higher  Black, tarry-looking stools  For urgent or emergent issues, a gastroenterologist can be reached at any hour by calling [phone].   DIET:  We do recommend  a small meal at first, but then you may proceed to your regular diet.  Drink plenty of fluids but you should avoid alcoholic beverages for 24 hours.  ACTIVITY:  You should plan to take it easy for the rest of today and you should NOT DRIVE or use heavy machinery until tomorrow (because of the sedation medicines used during the test).    FOLLOW UP: Our staff will call the number listed on your records 48-72 hours following your procedure to check on you and address any questions or concerns that you may have regarding the information given to you following your procedure. If we do not reach you, we will leave a message.  We will attempt to reach you two times.  During this call, we will ask if you have developed any symptoms of COVID 19. If you develop any symptoms (ie: fever, flu-like symptoms, shortness of breath, cough etc.) before then, please call [phone].  If you test positive for Covid 19 in the 2 weeks post procedure, please call and report this information to [place].    If any biopsies were taken you will be contacted by phone or by letter within the next 1-3 weeks.  Please call us at [phone] if you have not heard about the biopsies in 3 weeks.    SIGNATURES/CONFIDENTIALITY: You and/or your care partner have signed paperwork which will be entered into your electronic medical record.  These signatures attest to the  fact that that the information above on your After Visit Summary has been reviewed and is understood.  Full responsibility of the confidentiality of this discharge information lies with you and/or your care-partner.

## 2019-04-20 NOTE — Progress Notes (Signed)
Cytology bxs taken to lab at [facility].  Specimen given to [facility]

## 2019-04-20 NOTE — Op Note (Signed)
[place] Patient Name: Lisa Long Procedure Date: 04/20/2019 10:21 AM MRN: [id number] Endoscopist: Gatha Mayer , MD Age: 54 Referring MD:  Date of Birth: 12/30/1965 Gender: Female Account #: [id number] Procedure:                Upper GI endoscopy Indications:              Dysphagia, Odynophagia Medicines:                Propofol per Anesthesia, Monitored Anesthesia Care Procedure:                Pre-Anesthesia Assessment:                           - Prior to the procedure, a History and Physical                            was performed, and patient medications and                            allergies were reviewed. The patient's tolerance of                            previous anesthesia was also reviewed. The risks                            and benefits of the procedure and the sedation                            options and risks were discussed with the patient.                            All questions were answered, and informed consent                            was obtained. Prior Anticoagulants: The patient has                            taken no previous anticoagulant or antiplatelet                            agents. ASA Grade Assessment: II - A patient with                            mild systemic disease. After reviewing the risks                            and benefits, the patient was deemed in                            satisfactory condition to undergo the procedure.                           After obtaining informed consent, the endoscope was  passed under direct vision. Throughout the                            procedure, the patient's blood pressure, pulse, and                            oxygen saturations were monitored continuously. The                            Endoscope was introduced through the mouth, and                            advanced to the second part of duodenum. The upper                            GI  endoscopy was accomplished without difficulty.                            The patient tolerated the procedure well. Scope In: Scope Out: Findings:                 LA Grade C (one or more mucosal breaks continuous                            between tops of 2 or more mucosal folds, less than                            75% circumference) esophagitis with no bleeding was                            found 31 to 36 cm from the incisors. Biopsies were                            taken with a cold forceps for histology.                            Verification of patient identification for the                            specimen was done. Estimated blood loss was                            minimal. Cells for cytology were obtained by                            brushing. Verification of patient identification                            for the specimen was done. Estimated blood loss was                            minimal.  One benign-appearing, intrinsic moderate stenosis                            was found 36 cm from the incisors. The stenosis was                            traversed. A TTS dilator was passed through the                            scope. Dilation with a 16-17-18 mm balloon dilator                            was performed. A TTS dilator was passed through the                            scope. Dilation with an 18-19-20 mm balloon dilator                            was performed to 20 mm. The dilation site was                            examined and showed mild mucosal disruption.                            Estimated blood loss was minimal.                           A 2 cm hiatal hernia was present.                           The exam was otherwise without abnormality.                           The cardia and gastric fundus were normal on                            retroflexion. Complications:            No immediate complications. Estimated Blood Loss:      Estimated blood loss was minimal. Impression:               - LA Grade C esophagitis with no bleeding.                            Biopsied. Cells for cytology obtained. 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 Dilated.                           -  2 cm hiatal hernia.                           - The examination was otherwise normal. Recommendation:           - Patient has a contact number available for                            emergencies. The signs and symptoms of potential                            delayed complications were discussed with the                            patient. Return to normal activities tomorrow.                            Written discharge instructions were provided to the                            patient.                           - Clear liquids x 1 hour then soft foods rest of                            day. Start prior diet tomorrow.                           - Await pathology results. Gatha Mayer, MD 04/20/2019 11:05:27 AM This report has been signed electronically.

## 2019-04-20 NOTE — Progress Notes (Signed)
Pt tolerated well. VSS. Awake and to recovery. 

## 2019-04-20 NOTE — Progress Notes (Signed)
Called to room to assist during endoscopic procedure.  Patient ID and intended procedure confirmed with present staff. Received instructions for my participation in the procedure from the performing physician.  

## 2019-04-23 LAB — CYTOLOGY - NON PAP

## 2019-04-24 ENCOUNTER — Telehealth: Payer: Self-pay

## 2019-04-24 NOTE — Telephone Encounter (Signed)
  Follow up Call-  Call back number 04/20/2019 11/28/2018 03/08/2018  Post procedure Call Back phone  # [phone]  Permission to leave phone message Yes Yes Yes  Some recent data might be hidden     Patient questions:  Do you have a fever, pain , or abdominal swelling? No. Pain Score  0 *  Have you tolerated food without any problems? Yes.    Have you been able to return to your normal activities? Yes.    Do you have any questions about your discharge instructions: Diet   No. Medications  No. Follow up visit  No.  Do you have questions or concerns about your Care? No.  Actions: * If pain score is 4 or above: No action needed, pain <4.  Pt states that she is still having some dysphagia and difficulty at night, but that it hasn't gotten any worse and Dr. Carlean Purl is aware and waiting on pathology results.  1. Have you developed a fever since your procedure? no  2.   Have you had an respiratory symptoms (SOB or cough) since your procedure? no  3.   Have you tested positive for COVID 19 since your procedure no  4.   Have you had any family members/close contacts diagnosed with the COVID 19 since your procedure?  no   If yes to any of these questions please route to Joylene John, RN and Alphonsa Gin, Therapist, sports.

## 2019-04-24 NOTE — Telephone Encounter (Signed)
Attempted to reach patient for pos-procedure f/u call. No answer. Left message that we will make another attempt to reach her again later today and for her to please not hesitate to call us if she has any questions/concerns regarding her care.

## 2019-05-04 DIAGNOSIS — M349 Systemic sclerosis, unspecified: Secondary | ICD-10-CM | POA: Diagnosis not present

## 2019-05-04 DIAGNOSIS — Z79899 Other long term (current) drug therapy: Secondary | ICD-10-CM | POA: Diagnosis not present

## 2019-05-14 ENCOUNTER — Encounter: Payer: Self-pay | Admitting: Gastroenterology

## 2019-05-15 DIAGNOSIS — M349 Systemic sclerosis, unspecified: Secondary | ICD-10-CM | POA: Diagnosis not present

## 2019-05-15 DIAGNOSIS — M341 CR(E)ST syndrome: Secondary | ICD-10-CM | POA: Diagnosis not present

## 2019-05-15 LAB — VITAMIN B12: Vitamin B-12: 221

## 2019-05-15 LAB — CBC AND DIFFERENTIAL
HCT: 37 (ref 36–46)
Hemoglobin: 13.2 (ref 12.0–16.0)
Neutrophils Absolute: 7
Platelets: 308 (ref 150–399)
WBC: 11.8

## 2019-05-15 LAB — BASIC METABOLIC PANEL
BUN: 7 (ref 4–21)
CO2: 21 (ref 13–22)
Chloride: 100 (ref 99–108)
Creatinine: 0.7 (ref 0.5–1.1)
Glucose: 92
Potassium: 3.8 (ref 3.4–5.3)
Sodium: 138 (ref 137–147)

## 2019-05-15 LAB — HEPATIC FUNCTION PANEL
ALT: 18 (ref 7–35)
AST: 33 (ref 13–35)
Alkaline Phosphatase: 90 (ref 25–125)
Bilirubin, Total: 0.2

## 2019-05-15 LAB — COMPREHENSIVE METABOLIC PANEL
Albumin: 4.1 (ref 3.5–5.0)
Calcium: 9.7 (ref 8.7–10.7)
GFR calc Af Amer: 112
GFR calc non Af Amer: 97
Globulin: 2.6

## 2019-05-15 LAB — CBC: RBC: 3.61 — AB (ref 3.87–5.11)

## 2019-05-18 ENCOUNTER — Telehealth: Payer: Self-pay | Admitting: Internal Medicine

## 2019-05-18 MED ORDER — TRAZODONE HCL 50 MG PO TABS: 50.0000 mg | ORAL_TABLET | Freq: Every day | ORAL | 1 refills | Status: DC

## 2019-05-18 MED ORDER — LIDOCAINE VISCOUS HCL 2 % MT SOLN: 15.0000 mL | Freq: Four times a day (QID) | OROMUCOSAL | 0 refills | Status: DC | PRN

## 2019-05-18 NOTE — Telephone Encounter (Signed)
Trying viscous xylocaine for post-prandial esophageal pain Also trazodone qhs for pain, sleep disturbance  She is to cal for f/u appt next available

## 2019-06-21 NOTE — Patient Instructions (Addendum)
Health Maintenance Due  Topic Date Due  . HIV Screening  Never done  . PAP SMEAR-Modifier  01/04/2011  . MAMMOGRAM  07/14/2014  . INFLUENZA VACCINE  11/04/2018    No flowsheet data found.   Colace 1 cap twice per day miralax 1 cap twice per day If no BM in 2-3 days, take dulcolax. If no BM in 24 hrs, do enema Once cleared out ... Colace 1 cap 2x/day miralax 1/2 to 1 cap daily Goal is soft, easy to pass BM very 1-2 days

## 2019-06-22 ENCOUNTER — Other Ambulatory Visit: Payer: Self-pay

## 2019-06-22 ENCOUNTER — Encounter: Payer: Self-pay | Admitting: Family Medicine

## 2019-06-22 ENCOUNTER — Ambulatory Visit: Payer: BC Managed Care – PPO | Admitting: Family Medicine

## 2019-06-22 ENCOUNTER — Ambulatory Visit (INDEPENDENT_AMBULATORY_CARE_PROVIDER_SITE_OTHER): Payer: BC Managed Care – PPO | Admitting: Family Medicine

## 2019-06-22 VITALS — BP 110/88 | HR 108 | Temp 97.9°F | Ht 68.5 in | Wt 108.4 lb

## 2019-06-22 DIAGNOSIS — G63 Polyneuropathy in diseases classified elsewhere: Secondary | ICD-10-CM

## 2019-06-22 DIAGNOSIS — I10 Essential (primary) hypertension: Secondary | ICD-10-CM

## 2019-06-22 DIAGNOSIS — K219 Gastro-esophageal reflux disease without esophagitis: Secondary | ICD-10-CM

## 2019-06-22 DIAGNOSIS — K222 Esophageal obstruction: Secondary | ICD-10-CM

## 2019-06-22 DIAGNOSIS — I7300 Raynaud's syndrome without gangrene: Secondary | ICD-10-CM

## 2019-06-22 DIAGNOSIS — Z7689 Persons encountering health services in other specified circumstances: Secondary | ICD-10-CM

## 2019-06-22 DIAGNOSIS — K589 Irritable bowel syndrome without diarrhea: Secondary | ICD-10-CM

## 2019-06-22 DIAGNOSIS — G4700 Insomnia, unspecified: Secondary | ICD-10-CM

## 2019-06-22 DIAGNOSIS — M349 Systemic sclerosis, unspecified: Secondary | ICD-10-CM

## 2019-06-22 DIAGNOSIS — E538 Deficiency of other specified B group vitamins: Secondary | ICD-10-CM | POA: Diagnosis not present

## 2019-06-22 DIAGNOSIS — K5903 Drug induced constipation: Secondary | ICD-10-CM

## 2019-06-22 MED ORDER — ZOLPIDEM TARTRATE 5 MG PO TABS: ORAL_TABLET | ORAL | 0 refills | Status: DC

## 2019-06-22 MED ORDER — CYANOCOBALAMIN 1000 MCG/ML IJ SOLN
1000.0000 ug | Freq: Once | INTRAMUSCULAR | Status: AC
  Administered 2019-06-22: 1000 ug via INTRAMUSCULAR

## 2019-06-22 NOTE — Progress Notes (Signed)
Lisa Long is a 54 y.o. female  Chief Complaint  Patient presents with  . Establish Care    Pt c/o B12 deficiency and insomnia and constipation, x 74months.  Pt not UTD on mammogram, pap smear.    HPI: Lisa Long is a 54 y.o. female here to establish care with our office. Her husband Richardson Landry is also my patient.  She worked for years at [employer] but retired a few years ago d/t medical issues. Her PMHx is significant for CREST syndrome, Raynaud's for which she follows with rheum Dr. Flonnie Overman at [facility]. She also has h/o GERD, esophageal stricture, IBS for which she follows with Dr. Carlean Purl of [facility]. She has HTN, B12 deficiency. Tdap is UTD.  Ortho - Dr. Amedeo Plenty  Today she is concerned about a few issues: 1. B12 deficiency - recent labs with [facility] showed low B12 = 178 (cannot find this in Care Everywhere). She cannot tolerate oral supplement. 2. Insomina - she has trouble falling asleep and staying asleep. Pt was Rx'd trazodone by GI. She took it for 1 week and experienced "violent nightmares" that she has full recall of these. She also does not feel it helps her sleep thru the night. She stopped taking it. She has tried OTC unisom, melatonin, benadryl.  3. Constipation x 6 mo. She can go 7 days w/o BM and when she does have a BM, stool is small pellets. She gets stomach pain associated with this. She is on chronic narcotics.   Past Medical History:  Diagnosis Date  . Cervical spondylosis without myelopathy   . CREST (calcinosis, Raynaud's phenomenon, esophageal dysfunction, sclerodactyly, telangiectasia) ([place])   . CREST syndrome ([place])   . Esophageal stricture - GERD 07/23/2014  . GERD (gastroesophageal reflux disease)   . 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 Hx of adenomatous polyp of colon 10/14/2014  . Hypertension   . IBS (irritable bowel syndrome)   . Raynaud disease   . Raynaud's disease, idiopathic   . Seasonal allergies   . Spinal stenosis   . Tobacco use     . Vitamin B12 deficiency     Past Surgical History:  Procedure Laterality Date  . 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 AMPUTATION Right 05/31/2017   Procedure: Right middle finger amputation/revision amputation at distal interphalangeal versus proximal interphalangeal with repair reconstruction as needed;  Surgeon: Roseanne Kaufman, MD;  Location: [place];  Service: Orthopedics;  Laterality: Right;  60 mins  . 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 BREAST SURGERY     benign breast cysts  . CERVICAL FUSION    . COLONOSCOPY    . DERMOID CYST  EXCISION    . ESOPHAGOGASTRODUODENOSCOPY (EGD) WITH ESOPHAGEAL DILATION  2016  . 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 HEMORRHOID SURGERY    . IR GENERIC HISTORICAL  01/29/2016   IR ANGIOGRAM EXTREMITY LEFT 01/29/2016 [name], MD [facility]INTER[facility]  . IR GENERIC HISTORICAL  01/29/2016   IR US GUIDE VASC ACCESS RIGHT 01/29/2016 [name], MD [facility]  . NAILBED REPAIR Right 01/30/2013   Procedure: BIOPSY OF RIGHT LONG NAIL FOLD;  Surgeon: Youlanda Mighty  Luisa Dago., MD;  Location: [place];  Service: Orthopedics;  Laterality: Right;  [place] used as tourniquet--on at 0944, off at 1001.  . OOPHORECTOMY     Right ovary removal only  . SYMPATHECTOMY Right 01/30/2015   Procedure: DIGITAL SYMPATHECTOMY, RIGHT RADIAL ARTERY, ULNAR ARTERY, ARCH DIGITAL VESSELS INDEX MIDDLE RING SMALL GRAFT ULNAR ARTERY ;  Surgeon: [name], MD;  Location: [place];  Service: Orthopedics;  Laterality: Right;  . SYMPATHECTOMY Left 03/04/2016   Procedure: Digital SYMPATHECTOMY, left radial, ulnar, arch and  common digital times 3;  Surgeon: [name], MD;  Location: [place];  Service: Orthopedics;  Laterality: Left;  Digital SYMPATHECTOMY, left radial, ulnar, arch and common digital times 3    Social History   Socioeconomic History  . Marital status: Married    Spouse name: Not on file  . Number of children: 0  . Years of education: COLLEGE  . Highest education level: Not on file  Occupational History  . Occupation: Retired/disabled  Tobacco Use  . Smoking status: Former Smoker    Packs/day: 0.50    Years: 25.00    Pack years: 12.50    Types: Cigarettes  . Smokeless tobacco: Never Used  . Tobacco comment: Quit smoking cigarettes in 2017  Substance and Sexual Activity  . Alcohol use: Yes    Alcohol/week: 20.0 standard drinks    Types: 20 Glasses of wine per week    Comment:  3-4 drinks a week  . Drug use: No  . Sexual activity: Yes    Birth control/protection: Surgical    Comment: Husband had vasectomy  Other Topics Concern  . Not on file  Social History Narrative   Married and no children   Employed at [employer] for many years but 20 18-19 retired due to medical disability   She is a former smoker, 3-4 alcoholic drinks a week, no drug use   Social Determinants of Radio broadcast assistant Strain:   . Difficulty of Paying Living Expenses:   Food Insecurity:   . Worried About Charity fundraiser in the Last Year:   . Arboriculturist in the Last Year:   Transportation Needs:   . Film/video editor (Medical):   [name] Lack of Transportation (Non-Medical):   Physical Activity:   . Days of Exercise per Week:   . Minutes of Exercise per Session:   Stress:   . Feeling of Stress :   Social Connections:   . Frequency of Communication with Friends and Family:   . Frequency of Social Gatherings with Friends and Family:   . Attends Religious Services:   . Active Member of Clubs or Organizations:   . Attends Archivist  Meetings:   [name] Marital Status:   Intimate Partner Violence:   . Fear of Current or Ex-Partner:   . Emotionally Abused:   [name] Physically Abused:   . Sexually Abused:     Family History  Problem Relation Age of Onset  . ALS Mother   . Diabetes Other   . Hypertension Other   . Stroke Other        F 1st degree relative <60  . Colon cancer Neg Hx   . Esophageal cancer Neg Hx   . Rectal cancer Neg Hx   . Stomach cancer Neg Hx      Immunization History  Administered Date(s) Administered  . DTaP 06/09/2011  . Influenza Split 01/03/2014  .  Influenza Whole 01/26/2012  . Td 04/05/1998  . Tdap 07/23/2014    Outpatient Encounter Medications as of 06/22/2019  Medication Sig Note  . amLODipine (NORVASC) 10 MG tablet Take 10 mg by mouth daily. 05/30/2017: Patient pharmacy states she filled for #90 day supply in 08/2016--pt states she is STILL taking although pharmacy records indicate she is not  . esomeprazole (NEXIUM) 40 MG capsule Take 1 capsule (40 mg total) by mouth 2 (two) times daily before a meal.   . HYDROcodone-acetaminophen (NORCO) 7.5-325 MG tablet Take 1 tablet by mouth every 6 (six) hours as needed. for pain   . lidocaine (XYLOCAINE) 2 % solution Use as directed 15 mLs in the mouth or throat 4 (four) times daily as needed (esophageal pain). swallow   . traZODone (DESYREL) 50 MG tablet Take 1 tablet (50 mg total) by mouth at bedtime. Start 1/2 tablet first week (Patient not taking: Reported on 06/22/2019)    No facility-administered encounter medications on file as of 06/22/2019.     ROS: Pertinent positives and negatives noted in HPI. Remainder of ROS non-contributory   No Known Allergies  BP 110/88 (BP Location: Left Arm, Patient Position: Sitting, Cuff Size: Small)   Pulse (!) 108   Temp 97.9 F (36.6 C) (Temporal)   Ht 5' 8.5" (1.74 m)   Wt 108 lb 6.4 oz (49.2 kg)   LMP 01/28/2010 (Approximate)   SpO2 97%   BMI 16.24 kg/m   Physical Exam  Constitutional: She is  oriented to person, place, and time. She appears well-developed and well-nourished. No distress.  Pulmonary/Chest: No respiratory distress.  Abdominal: Soft. Bowel sounds are normal.  Neurological: She is alert and oriented to person, place, and time.  Psychiatric: She has a normal mood and affect. Her behavior is normal.     A/P:  1. Encounter to establish care with new doctor  2. Vitamin B12 deficiency neuropathy (HCC) - cyanocobalamin ((VITAMIN B-12)) injection 1,000 mcg - RTO in 1 week - will do weekly injection x 4 weeks then monthly   3. Essential hypertension, benign - controlled - pt has been off med x 2 weeks - cont to check BP 1-2x/wk and f/u PRN  4. Gastroesophageal reflux disease without esophagitis 5. Irritable bowel syndrome, unspecified type 6. Esophageal stricture - GERD - follows with GI Dr. Carlean Purl - cont nexium  7. Scleroderma ([place]) 8. Raynaud's disease without gangrene - follows with rheum  9. Insomnia, unspecified type - no improvement with multiple OTC meds, including melatonin - vivid nightmares with trazodone - discussed importance of hygiene - discussed alternate med options including rozerem, remeron, amitriptyline but ultimately decided to try 2.5-5mg  ambien qHS PRN Rx:  - zolpidem (AMBIEN) 5 MG tablet; 1/2 to 1 tab po qHS PRN  Dispense: 30 tablet; Refill: 0  10. Drug-induced constipation - not on a daily bowel regimen - clean out w/ colace BID, miralax BID, PRN dulcolax and/or enema - once cleaned out will start colace BID and miralax 1/2 to 1 cap daily    This visit occurred during the SARS-CoV-2 public health emergency.  Safety protocols were in place, including screening questions prior to the visit, additional usage of staff PPE, and extensive cleaning of exam room while observing appropriate contact time as indicated for disinfecting solutions.

## 2019-07-12 ENCOUNTER — Ambulatory Visit: Payer: BC Managed Care – PPO | Attending: Internal Medicine

## 2019-07-12 DIAGNOSIS — Z23 Encounter for immunization: Secondary | ICD-10-CM

## 2019-07-12 NOTE — Progress Notes (Signed)
   Covid-19 Vaccination Clinic  Name:  KORY DIMARZIO    MRN: [id number] DOB: [birth year]  07/12/2019  Ms. Everson was observed post Covid-19 immunization for 15 minutes without incident. She was provided with Vaccine Information Sheet and instruction to access the V-Safe system.   Ms. Mayne was instructed to call 911 with any severe reactions post vaccine: [name] Difficulty breathing  . Swelling of face and throat  . A fast heartbeat  . A bad rash all over body  . Dizziness and weakness   Immunizations Administered    Name Date Dose VIS Date Route   Pfizer COVID-19 Vaccine 07/12/2019 12:48 PM 0.3 mL 03/16/2019 Intramuscular   Manufacturer: [place]   Lot: [id number]   [place]: [id number]

## 2019-07-17 ENCOUNTER — Other Ambulatory Visit: Payer: Self-pay

## 2019-07-18 ENCOUNTER — Encounter: Payer: Self-pay | Admitting: Family Medicine

## 2019-07-18 ENCOUNTER — Ambulatory Visit (INDEPENDENT_AMBULATORY_CARE_PROVIDER_SITE_OTHER): Payer: BC Managed Care – PPO | Admitting: Family Medicine

## 2019-07-18 DIAGNOSIS — G63 Polyneuropathy in diseases classified elsewhere: Secondary | ICD-10-CM

## 2019-07-18 DIAGNOSIS — E538 Deficiency of other specified B group vitamins: Secondary | ICD-10-CM | POA: Diagnosis not present

## 2019-07-18 MED ORDER — CYANOCOBALAMIN 1000 MCG/ML IJ SOLN
1000.0000 ug | Freq: Once | INTRAMUSCULAR | Status: AC
  Administered 2019-07-18: 1000 ug via INTRAMUSCULAR

## 2019-07-18 NOTE — Progress Notes (Signed)
After obtaining consent, and per orders of Dr. Bryan Lemma, injection of B12 given by Lynda Rainwater in left deltoid. Patient instructed to remain in clinic for 20 minutes afterwards, and to report any adverse reaction to me immediately.

## 2019-07-25 ENCOUNTER — Other Ambulatory Visit: Payer: Self-pay | Admitting: Family Medicine

## 2019-07-25 ENCOUNTER — Other Ambulatory Visit: Payer: Self-pay | Admitting: Internal Medicine

## 2019-07-25 DIAGNOSIS — G4700 Insomnia, unspecified: Secondary | ICD-10-CM

## 2019-07-26 NOTE — Telephone Encounter (Signed)
I spoke with [place] and she said this really helps and she is just using PRN. May I refill Sir? Thank you.

## 2019-07-27 NOTE — Progress Notes (Signed)
I reviewed and agree with the documentation and plan as outlined below.   

## 2019-07-30 ENCOUNTER — Encounter: Payer: Self-pay | Admitting: Family Medicine

## 2019-07-30 NOTE — Telephone Encounter (Signed)
MC-Plz see refill req/thx dmf 

## 2019-08-06 ENCOUNTER — Ambulatory Visit: Payer: BC Managed Care – PPO | Attending: Internal Medicine

## 2019-08-06 DIAGNOSIS — Z23 Encounter for immunization: Secondary | ICD-10-CM

## 2019-08-06 NOTE — Progress Notes (Signed)
   Covid-19 Vaccination Clinic  Name:  ARACELIE LAUBY    MRN: [id number] DOB: [birth year]  08/06/2019  Ms. Andreen was observed post Covid-19 immunization for 15 minutes without incident. She was provided with Vaccine Information Sheet and instruction to access the V-Safe system.   Ms. Hauver was instructed to call 911 with any severe reactions post vaccine: [name] Difficulty breathing  . Swelling of face and throat  . A fast heartbeat  . A bad rash all over body  . Dizziness and weakness   Immunizations Administered    Name Date Dose VIS Date Route   Pfizer COVID-19 Vaccine 08/06/2019 12:01 PM 0.3 mL 05/30/2018 Intramuscular   Manufacturer: [place]   Lot: [id number]   [place]: [id number]

## 2019-08-22 ENCOUNTER — Encounter: Payer: Self-pay | Admitting: Family Medicine

## 2019-08-22 NOTE — Progress Notes (Signed)
[place] Dept of Med/thx dmf

## 2019-08-29 ENCOUNTER — Other Ambulatory Visit: Payer: Self-pay | Admitting: Family Medicine

## 2019-08-29 ENCOUNTER — Encounter: Payer: Self-pay | Admitting: Family Medicine

## 2019-08-29 DIAGNOSIS — G4700 Insomnia, unspecified: Secondary | ICD-10-CM

## 2019-08-29 NOTE — Telephone Encounter (Signed)
Last OV 07/18/19 Last fill 08/01/19  #30/0

## 2019-09-13 ENCOUNTER — Other Ambulatory Visit: Payer: Self-pay | Admitting: Family Medicine

## 2019-09-13 DIAGNOSIS — G4700 Insomnia, unspecified: Secondary | ICD-10-CM

## 2019-09-14 ENCOUNTER — Other Ambulatory Visit: Payer: Self-pay | Admitting: Family Medicine

## 2019-09-14 DIAGNOSIS — G4700 Insomnia, unspecified: Secondary | ICD-10-CM

## 2019-09-18 NOTE — Telephone Encounter (Signed)
MC-Plz see refill req/thx dmf 

## 2019-09-19 MED ORDER — ZOLPIDEM TARTRATE 5 MG PO TABS: 5.0000 mg | ORAL_TABLET | Freq: Every day | ORAL | 0 refills | Status: DC

## 2019-09-19 NOTE — Telephone Encounter (Signed)
Please see message and advise.  Thank you. ° °

## 2019-10-19 ENCOUNTER — Other Ambulatory Visit: Payer: Self-pay | Admitting: Family Medicine

## 2019-10-19 DIAGNOSIS — G4700 Insomnia, unspecified: Secondary | ICD-10-CM

## 2019-10-22 MED ORDER — ZOLPIDEM TARTRATE 5 MG PO TABS: 5.0000 mg | ORAL_TABLET | Freq: Every day | ORAL | 0 refills | Status: DC

## 2019-10-22 NOTE — Telephone Encounter (Signed)
Last OV 07/18/19 Last fill 09/19/19  #30/0

## 2019-11-22 DIAGNOSIS — Z79899 Other long term (current) drug therapy: Secondary | ICD-10-CM | POA: Diagnosis not present

## 2019-11-22 DIAGNOSIS — M341 CR(E)ST syndrome: Secondary | ICD-10-CM | POA: Diagnosis not present

## 2019-11-22 DIAGNOSIS — G8929 Other chronic pain: Secondary | ICD-10-CM | POA: Diagnosis not present

## 2019-11-23 ENCOUNTER — Encounter: Payer: Self-pay | Admitting: Family Medicine

## 2019-11-23 DIAGNOSIS — G4700 Insomnia, unspecified: Secondary | ICD-10-CM

## 2019-11-23 MED ORDER — ZOLPIDEM TARTRATE 5 MG PO TABS: 5.0000 mg | ORAL_TABLET | Freq: Every day | ORAL | 0 refills | Status: DC

## 2019-12-19 DIAGNOSIS — M349 Systemic sclerosis, unspecified: Secondary | ICD-10-CM | POA: Diagnosis not present

## 2019-12-19 DIAGNOSIS — Z681 Body mass index (BMI) 19 or less, adult: Secondary | ICD-10-CM | POA: Diagnosis not present

## 2019-12-19 DIAGNOSIS — K222 Esophageal obstruction: Secondary | ICD-10-CM | POA: Diagnosis not present

## 2019-12-19 DIAGNOSIS — I7300 Raynaud's syndrome without gangrene: Secondary | ICD-10-CM | POA: Diagnosis not present

## 2019-12-19 DIAGNOSIS — R634 Abnormal weight loss: Secondary | ICD-10-CM | POA: Diagnosis not present

## 2020-01-23 DIAGNOSIS — K222 Esophageal obstruction: Secondary | ICD-10-CM | POA: Diagnosis not present

## 2020-01-23 DIAGNOSIS — G8929 Other chronic pain: Secondary | ICD-10-CM | POA: Diagnosis not present

## 2020-01-23 DIAGNOSIS — I7301 Raynaud's syndrome with gangrene: Secondary | ICD-10-CM | POA: Diagnosis not present

## 2020-01-23 DIAGNOSIS — M349 Systemic sclerosis, unspecified: Secondary | ICD-10-CM | POA: Diagnosis not present

## 2020-01-23 DIAGNOSIS — K5900 Constipation, unspecified: Secondary | ICD-10-CM | POA: Diagnosis not present

## 2020-01-23 DIAGNOSIS — I7300 Raynaud's syndrome without gangrene: Secondary | ICD-10-CM | POA: Diagnosis not present

## 2020-01-23 DIAGNOSIS — K219 Gastro-esophageal reflux disease without esophagitis: Secondary | ICD-10-CM | POA: Diagnosis not present

## 2020-02-06 ENCOUNTER — Telehealth: Payer: Self-pay | Admitting: Internal Medicine

## 2020-02-06 NOTE — Telephone Encounter (Signed)
Dr. Audria Nine calling to speak with  Dr. Carlean Purl she said she has questions in reference to her Disability claim [id number].

## 2020-02-07 NOTE — Telephone Encounter (Signed)
I spoke to the doctor about Lisa Long and her GI signs and symptoms and how they would impact her disability.  Did not think her GI issues alone would lead to disability but certainly would make it very difficult for her to work 40 hours a week.

## 2020-02-25 ENCOUNTER — Other Ambulatory Visit (HOSPITAL_COMMUNITY): Payer: Self-pay | Admitting: Registered Nurse

## 2020-02-25 DIAGNOSIS — M349 Systemic sclerosis, unspecified: Secondary | ICD-10-CM

## 2020-03-11 ENCOUNTER — Telehealth: Payer: Self-pay | Admitting: Cardiovascular Disease

## 2020-03-11 NOTE — Telephone Encounter (Signed)
Returned call to pt she states that she was a pt here 10 years ago and would like to be seen without a referral. She states that she has been waiting for 2 months to get a referral and has been unsuccessful in getting an appointment. Also, her provider has ordered an ECHO to be done here at the same time and still has not been called to scheduled. Please call to schedule.

## 2020-03-11 NOTE — Telephone Encounter (Signed)
   Pt is calling, she said she is an old friend Dr. Gwenlyn Found, she wanted to ask him if she can see him without a referral

## 2020-03-12 NOTE — Telephone Encounter (Signed)
Follow up   Pt Is needing an Echo, Lattie Haw are we still waiting on Authorization to schedule?   Comments  02/20/20 spoke with Jacqulyn Bath NP - still pending PA# ( see notes in chart) has to have PA# to schedule/[facility]

## 2020-03-13 NOTE — Telephone Encounter (Signed)
Left message for patient to call and discuss scheduling New Patient visit with Dr. Gwenlyn Found

## 2020-03-13 NOTE — Telephone Encounter (Signed)
I do not know how this got all mixed up. Pt just wanted an appointment to see Dr Gwenlyn Found.

## 2020-03-14 ENCOUNTER — Telehealth: Payer: Self-pay | Admitting: Radiology

## 2020-03-14 ENCOUNTER — Ambulatory Visit (INDEPENDENT_AMBULATORY_CARE_PROVIDER_SITE_OTHER): Payer: Medicare Other | Admitting: Cardiology

## 2020-03-14 ENCOUNTER — Other Ambulatory Visit: Payer: Self-pay

## 2020-03-14 ENCOUNTER — Encounter: Payer: Self-pay | Admitting: Cardiology

## 2020-03-14 VITALS — BP 130/90 | HR 114 | Ht 68.0 in | Wt 110.0 lb

## 2020-03-14 DIAGNOSIS — I10 Essential (primary) hypertension: Secondary | ICD-10-CM

## 2020-03-14 DIAGNOSIS — R002 Palpitations: Secondary | ICD-10-CM | POA: Diagnosis not present

## 2020-03-14 DIAGNOSIS — Z720 Tobacco use: Secondary | ICD-10-CM

## 2020-03-14 DIAGNOSIS — M341 CR(E)ST syndrome: Secondary | ICD-10-CM

## 2020-03-14 NOTE — Telephone Encounter (Signed)
Enrolled patient for a 7 day Zio XT Monitor to be mailed to patients home.  

## 2020-03-14 NOTE — Progress Notes (Signed)
Cardiology Office Note:    Date:  03/14/2020   ID:  Lisa Long, DOB 05-23-1965, MRN [id number]  PCP:  Ronnald Nian, DO  Cardiologist:  No primary care provider on file.  Electrophysiologist:  None   Referring MD: Ronnald Nian, DO   Chief Complaint  Patient presents with  . Palpitations    History of Present Illness:    Lisa Long is a 54 y.o. female with a hx of CREST syndrome, esophageal stricture, hypertension, tobacco use who presents for an initial evaluation of palpitations.  She reports that she has been having palpitations for years.  Occurs multiple times per week.  Often wakes her up at night.  Feels like heart is racing, typically last for about 15 minutes.  Feels lightheaded and weak during episodes.  She walks for 45 minutes/day, denies any exertional chest pain or dyspnea.  Denies any lower extremity edema.  No syncopal episodes.  She quit smoking in 2017 but now is smoking about 4 cigarettes/week.  Family history includes mother has pulmonary hypertension.   Past Medical History:  Diagnosis Date  . Cervical spondylosis without myelopathy   . CREST (calcinosis, Raynaud's phenomenon, esophageal dysfunction, sclerodactyly, telangiectasia) ([place])   . CREST syndrome ([place])   . Esophageal stricture - GERD 07/23/2014  . GERD (gastroesophageal reflux disease)   . 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 Hx of adenomatous polyp of colon 10/14/2014  . Hypertension   . IBS (irritable bowel syndrome)   . Raynaud disease   . Raynaud's disease, idiopathic   . Seasonal allergies   . Spinal stenosis   . Tobacco use   . Vitamin B12 deficiency     Past Surgical History:  Procedure Laterality Date  . 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 AMPUTATION Right 05/31/2017   Procedure: Right  middle finger amputation/revision amputation at distal interphalangeal versus proximal interphalangeal with repair reconstruction as needed;  Surgeon: Roseanne Kaufman, MD;  Location: [facility];  Service: Orthopedics;  Laterality: Right;  60 mins  . 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 BREAST SURGERY     benign breast cysts  . CERVICAL FUSION    . COLONOSCOPY    . DERMOID CYST  EXCISION    . ESOPHAGOGASTRODUODENOSCOPY (EGD) WITH ESOPHAGEAL DILATION  2016  . 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 HEMORRHOID SURGERY    . IR GENERIC HISTORICAL  01/29/2016   IR ANGIOGRAM EXTREMITY LEFT 01/29/2016 [name], MD [facility]  . IR GENERIC HISTORICAL  01/29/2016   IR US GUIDE VASC ACCESS RIGHT 01/29/2016 [name], MD [facility]  . NAILBED REPAIR Right 01/30/2013   Procedure: BIOPSY OF RIGHT LONG NAIL FOLD;  Surgeon: [name]., MD;  Location: [facility];  Service: Orthopedics;  Laterality: Right;  [place] used as tourniquet--on at 0944, off at 1001.  . OOPHORECTOMY     Right ovary removal only  . SYMPATHECTOMY Right 01/30/2015   Procedure: DIGITAL SYMPATHECTOMY, RIGHT RADIAL ARTERY, ULNAR ARTERY, ARCH DIGITAL VESSELS INDEX MIDDLE RING SMALL GRAFT ULNAR ARTERY ;  Surgeon: [name], MD;  Location: [facility];  Service:  Orthopedics;  Laterality: Right;  . SYMPATHECTOMY Left 03/04/2016   Procedure: Digital SYMPATHECTOMY, left radial, ulnar, arch and common digital times 3;  Surgeon: [name], MD;  Location: [facility];  Service: Orthopedics;  Laterality: Left;  Digital SYMPATHECTOMY, left radial, ulnar, arch and common digital times 3    Current Medications: Current Meds  Medication Sig  . HYDROcodone-acetaminophen (NORCO) 10-325 MG tablet SMARTSIG:1 By  Mouth 4-5 Times Daily  . naloxone (NARCAN) nasal spray 4 mg/0.1 mL Place into the nose.  [name] omeprazole (PRILOSEC) 20 MG capsule Take by mouth.     Allergies:   Patient has no known allergies.   Social History   Socioeconomic History  . Marital status: Married    Spouse name: Not on file  . Number of children: 0  . Years of education: COLLEGE  . Highest education level: Not on file  Occupational History  . Occupation: Retired/disabled  Tobacco Use  . Smoking status: Former Smoker    Packs/day: 0.50    Years: 25.00    Pack years: 12.50    Types: Cigarettes  . Smokeless tobacco: Never Used  . Tobacco comment: Quit smoking cigarettes in 2017  Vaping Use  . Vaping Use: Never used  Substance and Sexual Activity  . Alcohol use: Yes    Alcohol/week: 20.0 standard drinks    Types: 20 Glasses of wine per week    Comment:  3-4 drinks a week  . Drug use: No  . Sexual activity: Yes    Birth control/protection: Surgical    Comment: Husband had vasectomy  Other Topics Concern  . Not on file  Social History Narrative   Married and no children   Employed at [employer] for many years but 20 18-19 retired due to medical disability   She is a former smoker, 3-4 alcoholic drinks a week, no drug use   Social Determinants of Radio broadcast assistant Strain: Not on file  Food Insecurity: Not on file  Transportation Needs: Not on file  Physical Activity: Not on file  Stress: Not on file  Social Connections: Not on file     Family History: The patient's family history includes ALS in her mother; Diabetes in an other family member; Hypertension in an other family member; Stroke in an other family member. There is no history of Colon cancer, Esophageal cancer, Rectal cancer, or Stomach cancer.  ROS:   Please see the history of present illness.    All other systems reviewed and are negative.  EKGs/Labs/Other Studies Reviewed:    The following studies were  reviewed today:   EKG:  EKG is ordered today.  The ekg ordered today demonstrates sinus tachycardia, rate 114, Q waves V1/2  Recent Labs: 05/15/2019: ALT 18; BUN 7; Creatinine 0.7; Hemoglobin 13.2; Platelets 308; Potassium 3.8; Sodium 138  Recent Lipid Panel    Component Value Date/Time   CHOL 298 (H) 07/26/2014 1442   TRIG 177.0 (H) 07/26/2014 1442   HDL 112.40 07/26/2014 1442   CHOLHDL 3 07/26/2014 1442   VLDL 35.4 07/26/2014 1442   LDLCALC 150 (H) 07/26/2014 1442    Physical Exam:    VS:  BP 130/90   Pulse (!) 114   Ht 5\' 8"  (1.727 m)   Wt 110 lb (49.9 kg)   LMP 01/28/2010 (Approximate)   SpO2 97%   BMI 16.73 kg/m     Wt Readings from Last 3 Encounters:  03/14/20 110 lb (49.9 kg)  06/22/19  108 lb 6.4 oz (49.2 kg)  04/20/19 109 lb (49.4 kg)     GEN:   in no acute distress HEENT: Normal NECK: No JVD; No carotid bruits CARDIAC: RRR, no murmurs, rubs, gallops RESPIRATORY:  Clear to auscultation without rales, wheezing or rhonchi  ABDOMEN: Soft, non-tender, non-distended MUSCULOSKELETAL:  No edema; status post multiple amputations of fingers SKIN: Warm and dry NEUROLOGIC:  Alert and oriented x 3 PSYCHIATRIC:  Normal affect   ASSESSMENT:    1. Palpitations   2. CREST syndrome ([place])   3. Essential hypertension   4. Tobacco use    PLAN:    Palpitations: Description concerning for arrhythmia, will evaluate further with Zio patch x7 days  CREST syndrome: We will check echocardiogram to evaluate for pulmonary hypertension  Hypertension: Previously was on amlodipine but currently off medications.  We will continue to monitor  Tobacco use: Encourage cessation  RTC in 3 months  Medication Adjustments/Labs and Tests Ordered: Current medicines are reviewed at length with the patient today.  Concerns regarding medicines are outlined above.  Orders Placed This Encounter  Procedures  . LONG TERM MONITOR (3-14 DAYS)  . EKG 12-Lead  . ECHOCARDIOGRAM COMPLETE   No  orders of the defined types were placed in this encounter.   Patient Instructions  Medication Instructions:  Your physician recommends that you continue on your current medications as directed. Please refer to the Current Medication list given to you today.  Testing/Procedures: Your physician has requested that you have an echocardiogram. Echocardiography is a painless test that uses sound waves to create images of your heart. It provides your doctor with information about the size and shape of your heart and how well your heart's chambers and valves are working. This procedure takes approximately one hour. There are no restrictions for this procedure. This will be done at our [facility] location:  [place] has requested you wear your ZIO patch monitor 7 days.   This is a single patch monitor.  [employer] supplies one patch monitor per enrollment.  Additional stickers are not available.   Please do not apply patch if you will be having a Nuclear Stress Test, Echocardiogram, Cardiac CT, MRI, or Chest Xray during the time frame you would be wearing the monitor. The patch cannot be worn during these tests.  You cannot remove and re-apply the ZIO XT patch monitor.   Your ZIO patch monitor will be sent USPS Priority mail from [facility] directly to your home address. The monitor may also be mailed to a PO BOX if home delivery is not available.   It may take 3-5 days to receive your monitor after you have been enrolled.   Once you have received you monitor, please review enclosed instructions.  Your monitor has already been registered assigning a specific monitor serial # to you.   Applying the monitor   Shave hair from upper left chest.   Hold abrader disc by orange tab.  Rub abrader in 40 strokes over left upper chest as indicated in your monitor instructions.   Clean area with 4 enclosed alcohol pads  .  Use all pads to assure are is cleaned thoroughly.  Let dry.   Apply patch as indicated in monitor instructions.  Patch will be place under collarbone on left side of chest with arrow pointing upward.   Rub patch adhesive wings for 2 minutes.Remove white label  marked "1".  Remove white label marked "2".  Rub patch adhesive wings for 2 additional minutes.   While looking in a mirror, press and release button in center of patch.  A small green light will flash 3-4 times .  This will be your only indicator the monitor has been turned on.     Do not shower for the first 24 hours.  You may shower after the first 24 hours.   Press button if you feel a symptom. You will hear a small click.  Record Date, Time and Symptom in the Patient Log Book.   When you are ready to remove patch, follow instructions on last 2 pages of Patient Log Book.  Stick patch monitor onto last page of Patient Log Book.   Place Patient Log Book in [place] box.  Use locking tab on box and tape box closed securely.  The [employer] and [employer] has [employer] on it.  Please place in mailbox as soon as possible.  Your physician should have your test results approximately 7 days after the monitor has been mailed back to [facility]   Call [place] at [phone] if you have questions regarding your ZIO XT patch monitor.  Call them immediately if you see an orange light blinking on your monitor.   If your monitor falls off in less than 4 days contact our Monitor department at [phone].  If your monitor becomes loose or falls off after 4 days call [employer] at [phone] for suggestions on securing your monitor.   Follow-Up: At [facility], you and your health needs are our priority.  As part of our continuing mission to provide you with exceptional heart care, we have created designated Provider Care Teams.  These Care Teams include your primary Cardiologist (physician) and Advanced Practice  Providers (APPs -  Physician Assistants and Nurse Practitioners) who all work together to provide you with the care you need, when you need it.  We recommend signing up for the patient portal called "MyChart".  Sign up information is provided on this After Visit Summary.  MyChart is used to connect with patients for Virtual Visits (Telemedicine).  Patients are able to view lab/test results, encounter notes, upcoming appointments, etc.  Non-urgent messages can be sent to your provider as well.   To learn more about what you can do with MyChart, go to [email or web address].    Your next appointment:   3 month(s)  The format for your next appointment:   In Person  Provider:   Oswaldo Milian, MD         Signed, Donato Heinz, MD  03/14/2020 9:18 PM    [facility]

## 2020-03-14 NOTE — Patient Instructions (Signed)
Medication Instructions:  Your physician recommends that you continue on your current medications as directed. Please refer to the Current Medication list given to you today.  Testing/Procedures: Your physician has requested that you have an echocardiogram. Echocardiography is a painless test that uses sound waves to create images of your heart. It provides your doctor with information about the size and shape of your heart and how well your heart's chambers and valves are working. This procedure takes approximately one hour. There are no restrictions for this procedure. This will be done at our [facility] location:  [place] has requested you wear your ZIO patch monitor 7 days.   This is a single patch monitor.  [employer] supplies one patch monitor per enrollment.  Additional stickers are not available.   Please do not apply patch if you will be having a Nuclear Stress Test, Echocardiogram, Cardiac CT, MRI, or Chest Xray during the time frame you would be wearing the monitor. The patch cannot be worn during these tests.  You cannot remove and re-apply the ZIO XT patch monitor.   Your ZIO patch monitor will be sent USPS Priority mail from [facility] directly to your home address. The monitor may also be mailed to a PO BOX if home delivery is not available.   It may take 3-5 days to receive your monitor after you have been enrolled.   Once you have received you monitor, please review enclosed instructions.  Your monitor has already been registered assigning a specific monitor serial # to you.   Applying the monitor   Shave hair from upper left chest.   Hold abrader disc by orange tab.  Rub abrader in 40 strokes over left upper chest as indicated in your monitor instructions.   Clean area with 4 enclosed alcohol pads .  Use all pads to assure are is cleaned thoroughly.  Let dry.   Apply patch as  indicated in monitor instructions.  Patch will be place under collarbone on left side of chest with arrow pointing upward.   Rub patch adhesive wings for 2 minutes.Remove white label marked "1".  Remove white label marked "2".  Rub patch adhesive wings for 2 additional minutes.   While looking in a mirror, press and release button in center of patch.  A small green light will flash 3-4 times .  This will be your only indicator the monitor has been turned on.     Do not shower for the first 24 hours.  You may shower after the first 24 hours.   Press button if you feel a symptom. You will hear a small click.  Record Date, Time and Symptom in the Patient Log Book.   When you are ready to remove patch, follow instructions on last 2 pages of Patient Log Book.  Stick patch monitor onto last page of Patient Log Book.   Place Patient Log Book in [place] box.  Use locking tab on box and tape box closed securely.  The [employer] has [employer] on it.  Please place in mailbox as soon as possible.  Your physician should have your test results approximately 7 days after the monitor has been mailed back to [facility].   Call [place] at [phone] if you have questions regarding your ZIO XT patch monitor.  Call them immediately if you see an orange light blinking on  your monitor.   If your monitor falls off in less than 4 days contact our Monitor department at [phone].  If your monitor becomes loose or falls off after 4 days call [employer] at [phone] for suggestions on securing your monitor.   Follow-Up: At [facility], you and your health needs are our priority.  As part of our continuing mission to provide you with exceptional heart care, we have created designated Provider Care Teams.  These Care Teams include your primary Cardiologist (physician) and Advanced Practice Providers (APPs -  Physician Assistants and Nurse Practitioners) who all work together to  provide you with the care you need, when you need it.  We recommend signing up for the patient portal called "MyChart".  Sign up information is provided on this After Visit Summary.  MyChart is used to connect with patients for Virtual Visits (Telemedicine).  Patients are able to view lab/test results, encounter notes, upcoming appointments, etc.  Non-urgent messages can be sent to your provider as well.   To learn more about what you can do with MyChart, go to [email or web address].    Your next appointment:   3 month(s)  The format for your next appointment:   In Person  Provider:   Oswaldo Milian, MD

## 2020-03-20 NOTE — Telephone Encounter (Signed)
Pt had appt w/Dr Schumann 12-10 and ECHO scheduled and f/u w/Dr Gwenlyn Found scheduled in march

## 2020-03-22 ENCOUNTER — Ambulatory Visit (INDEPENDENT_AMBULATORY_CARE_PROVIDER_SITE_OTHER): Payer: Medicare Other

## 2020-03-22 DIAGNOSIS — R002 Palpitations: Secondary | ICD-10-CM

## 2020-03-26 DIAGNOSIS — M341 CR(E)ST syndrome: Secondary | ICD-10-CM | POA: Diagnosis not present

## 2020-03-26 DIAGNOSIS — M349 Systemic sclerosis, unspecified: Secondary | ICD-10-CM | POA: Diagnosis not present

## 2020-03-26 DIAGNOSIS — M47812 Spondylosis without myelopathy or radiculopathy, cervical region: Secondary | ICD-10-CM | POA: Diagnosis not present

## 2020-04-03 NOTE — Telephone Encounter (Signed)
Patient is following up regarding Zio monitor. She states she had issues with keeping it on and she was only able to wear it for 3 days. She states she sent it back about a week ago and she would like to know if this will be sufficient or if she will need to go th another option. Please advise.

## 2020-04-03 NOTE — Telephone Encounter (Signed)
The patient stated that this was her second monitor and it fell off after three days. She has mailed it back. Results are not in yet.

## 2020-04-09 ENCOUNTER — Ambulatory Visit (HOSPITAL_COMMUNITY): Payer: Medicare Other | Attending: Cardiology

## 2020-04-09 ENCOUNTER — Other Ambulatory Visit: Payer: Self-pay

## 2020-04-09 DIAGNOSIS — M341 CR(E)ST syndrome: Secondary | ICD-10-CM | POA: Diagnosis not present

## 2020-04-09 DIAGNOSIS — I10 Essential (primary) hypertension: Secondary | ICD-10-CM | POA: Insufficient documentation

## 2020-04-09 DIAGNOSIS — R002 Palpitations: Secondary | ICD-10-CM | POA: Insufficient documentation

## 2020-04-09 DIAGNOSIS — F17200 Nicotine dependence, unspecified, uncomplicated: Secondary | ICD-10-CM | POA: Diagnosis not present

## 2020-04-09 LAB — ECHOCARDIOGRAM COMPLETE
Area-P 1/2: 2.95 cm2
S' Lateral: 3.2 cm

## 2020-04-10 ENCOUNTER — Encounter: Payer: Self-pay | Admitting: Cardiology

## 2020-05-06 ENCOUNTER — Ambulatory Visit: Payer: BC Managed Care – PPO | Admitting: Cardiovascular Disease

## 2020-06-12 DIAGNOSIS — M25572 Pain in left ankle and joints of left foot: Secondary | ICD-10-CM | POA: Diagnosis not present

## 2020-06-13 ENCOUNTER — Ambulatory Visit: Payer: Medicare Other | Admitting: Cardiology

## 2020-06-13 ENCOUNTER — Other Ambulatory Visit: Payer: Self-pay

## 2020-06-13 ENCOUNTER — Encounter: Payer: Self-pay | Admitting: Cardiology

## 2020-06-13 ENCOUNTER — Encounter: Payer: Self-pay | Admitting: Radiology

## 2020-06-13 VITALS — BP 100/78 | HR 125 | Ht 68.0 in | Wt 102.8 lb

## 2020-06-13 DIAGNOSIS — Z720 Tobacco use: Secondary | ICD-10-CM | POA: Diagnosis not present

## 2020-06-13 DIAGNOSIS — R002 Palpitations: Secondary | ICD-10-CM

## 2020-06-13 DIAGNOSIS — M341 CR(E)ST syndrome: Secondary | ICD-10-CM | POA: Diagnosis not present

## 2020-06-13 DIAGNOSIS — I10 Essential (primary) hypertension: Secondary | ICD-10-CM | POA: Diagnosis not present

## 2020-06-13 DIAGNOSIS — M2550 Pain in unspecified joint: Secondary | ICD-10-CM

## 2020-06-13 NOTE — Progress Notes (Signed)
Cardiology Office Note:    Date:  06/15/2020   ID:  Lisa Long, DOB 16-Nov-1965, MRN [id number]  PCP:  Lisa Nian, DO  Cardiologist:  No primary care provider on file.  Electrophysiologist:  None   Referring MD: Lisa Nian, DO   Chief Complaint  Patient presents with  . Palpitations    History of Present Illness:    Lisa Long is a 55 y.o. female with a hx of CREST syndrome, esophageal stricture, hypertension, tobacco use who presents for follow-up.  She was initially seen on 03/14/2020.  She reports that she has been having palpitations for years.  Occurs multiple times per week.  Often wakes her up at night.  Feels like heart is racing, typically last for about 15 minutes.  Feels lightheaded and weak during episodes.  She walks for 45 minutes/day, denies any exertional chest pain or dyspnea.  Denies any lower extremity edema.  No syncopal episodes.  She quit smoking in 2017 but now is smoking about 4 cigarettes/week.  Family history includes mother has pulmonary hypertension.  Echocardiogram on 04/09/2020 showed normal biventricular function, no significant valvular disease.  Zio patch x4 days on 04/09/2020 showed no significant arrhythmias.  Since last clinic visit, she reports that she continues to have palpitations.  States that she did not have vertigo palpitations while wearing a monitor.  Main complaint today is pain/swelling/redness in left ankle.  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  She denies any fevers or chills.  She continues to smoke, about 5 to 6 cigarettes/week.   Past Medical History:  Diagnosis Date  . Cervical spondylosis without myelopathy   . CREST (calcinosis, Raynaud's phenomenon, esophageal dysfunction, sclerodactyly, telangiectasia) ([place])   . CREST syndrome ([place])   . Esophageal stricture - GERD 07/23/2014  . GERD (gastroesophageal reflux disease)   . 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 Hx of adenomatous polyp of colon 10/14/2014  . Hypertension   . IBS (irritable bowel syndrome)   . Raynaud disease   . Raynaud's disease, idiopathic   . Seasonal allergies   . Spinal stenosis   . Tobacco use   . Vitamin B12 deficiency     Past Surgical History:  Procedure Laterality Date  . 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 AMPUTATION Right 05/31/2017   Procedure: Right middle finger amputation/revision amputation at distal interphalangeal versus proximal interphalangeal with repair reconstruction as needed;  Surgeon: Roseanne Kaufman, MD;  Location: [place];  Service: Orthopedics;  Laterality: Right;  60 mins  . 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 BREAST SURGERY     benign breast cysts  . CERVICAL FUSION    . COLONOSCOPY    . DERMOID CYST  EXCISION    . ESOPHAGOGASTRODUODENOSCOPY (EGD) WITH ESOPHAGEAL DILATION  2016  . 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 HEMORRHOID SURGERY    . IR GENERIC HISTORICAL  01/29/2016   IR ANGIOGRAM EXTREMITY LEFT 01/29/2016 [name], MD [facility]  . IR GENERIC HISTORICAL  01/29/2016   IR US GUIDE VASC ACCESS RIGHT 01/29/2016 [name], MD [facility]  . NAILBED REPAIR Right 01/30/2013   Procedure: BIOPSY OF RIGHT LONG NAIL FOLD;  Surgeon: [name]., MD;  Location: [place];  Service: Orthopedics;  Laterality: Right;  [place] used as tourniquet--on at 0944, off at 1001.  . OOPHORECTOMY     Right ovary removal only  . SYMPATHECTOMY Right 01/30/2015   Procedure: DIGITAL SYMPATHECTOMY, RIGHT RADIAL ARTERY, ULNAR ARTERY,  ARCH DIGITAL VESSELS INDEX MIDDLE RING SMALL GRAFT ULNAR ARTERY ;  Surgeon: [name], MD;  Location: [place];  Service: Orthopedics;  Laterality: Right;  . SYMPATHECTOMY Left 03/04/2016   Procedure: Digital SYMPATHECTOMY, left radial, ulnar, arch and common digital times 3;  Surgeon: [name], MD;  Location: [place];  Service: Orthopedics;  Laterality: Left;  Digital SYMPATHECTOMY, left radial, ulnar, arch and common digital times 3    Current Medications: Current Meds  Medication Sig  . HYDROcodone-acetaminophen (NORCO) 10-325 MG tablet SMARTSIG:1 By Mouth 4-5 Times Daily  . naloxone (NARCAN) nasal spray 4 mg/0.1 mL Place into the nose.  [name] omeprazole (PRILOSEC) 20 MG capsule Take by mouth.     Allergies:   Patient has no known allergies.   Social History   Socioeconomic History  . Marital status: Unknown    Spouse name: Not on file  . Number of children: 0  . Years of education: COLLEGE  . Highest education level: Not on file  Occupational History  . Occupation: Retired/disabled  Tobacco Use  . Smoking status: Former Smoker    Packs/day: 0.50    Years: 25.00    Pack years: 12.50    Types: Cigarettes  . Smokeless tobacco: Never Used  . Tobacco comment: Quit smoking cigarettes in 2017  Vaping Use  . Vaping Use: Never used  Substance and Sexual Activity  . Alcohol use: Yes    Alcohol/week: 20.0 standard drinks    Types: 20 Glasses of wine per week    Comment:  3-4 drinks a week  . Drug use: No  . Sexual activity: Yes    Birth control/protection: Surgical    Comment: Husband had vasectomy  Other Topics Concern  . Not on file  Social History Narrative   ** Merged History Encounter **       Married and no children Employed at [employer] for many years but 64 18-19 retired due to medical disability She is a former smoker, 3-4 alcoholic drinks a week, no drug use   Social Determinants of Systems developer Strain: Not on file  Food Insecurity: Not on file  Transportation Needs: Not on file  Physical Activity: Not on file  Stress: Not on file  Social Connections: Not on file     Family History: The patient's family history includes ALS in her mother; Diabetes in an other family member; Hypertension in an other family member; Stroke in an other family member. There is no history of Colon cancer, Esophageal cancer, Rectal cancer, or Stomach cancer.  ROS:   Please see the history of present illness.    All other systems reviewed and are negative.  EKGs/Labs/Other Studies Reviewed:    The following studies were reviewed today:   EKG:  EKG is not ordered today.  The ekg ordered at prior clinic visit demonstrates sinus tachycardia, rate 114, Q waves V1/2  Recent Labs: 06/13/2020: BUN 7; Creatinine, Ser 0.53; Hemoglobin 12.2; Platelets 391; Potassium 3.7; Sodium 141  Recent Lipid Panel  Component Value Date/Time   CHOL 298 (H) 07/26/2014 1442   TRIG 177.0 (H) 07/26/2014 1442   HDL 112.40 07/26/2014 1442   CHOLHDL 3 07/26/2014 1442   VLDL 35.4 07/26/2014 1442   LDLCALC 150 (H) 07/26/2014 1442    Physical Exam:    VS:  BP 100/78   Pulse (!) 125   Ht 5\' 8"  (1.727 m)   Wt 102 lb 12.8 oz (46.6 kg)   LMP 01/28/2010 (Approximate)   SpO2 98%   BMI 15.63 kg/m     Wt Readings from Last 3 Encounters:  06/13/20 102 lb 12.8 oz (46.6 kg)  03/14/20 110 lb (49.9 kg)  06/22/19 108 lb 6.4 oz (49.2 kg)     GEN:   in no acute distress HEENT: Normal NECK: No JVD; No carotid bruits CARDIAC: RRR, no murmurs, rubs, gallops RESPIRATORY:  Clear to auscultation without rales, wheezing or rhonchi  ABDOMEN: Soft, non-tender, non-distended MUSCULOSKELETAL:  No edema; status post multiple amputations of fingers.  Swelling/erythema over L lateral malleolus SKIN: Warm and dry NEUROLOGIC:  Alert and oriented x 3 PSYCHIATRIC:  Normal affect   ASSESSMENT:    1. Palpitations   2. CREST  syndrome ([place])   3. Essential hypertension   4. Arthralgia, unspecified joint   5. Tobacco use    PLAN:    Palpitations: Zio patch x4 days on 04/09/2020 showed no significant arrhythmias.  She reports that she did not have her typical symptoms while wearing the monitor.  She had difficulty with the Zio patch sticking to her chest and was only able to wear it for 4 days.  Will attempt Preventice monitor x7 days  CREST syndrome: Echocardiogram on 04/09/2020 showed no significant abnormalities.  Hypertension: Previously was on amlodipine but currently off medications.  We will continue to monitor  Ankle swelling/redness: Seen by orthopedic surgery, started on prednisone yesterday.  Encourage patient to follow-up with orthopedic surgery if not improving or developing any fever/chills.  She is requesting to check uric acid level, will check  Tobacco use: Patient counseled on the risk of tobacco use and cessation strongly encouraged.  RTC in 3 months  Medication Adjustments/Labs and Tests Ordered: Current medicines are reviewed at length with the patient today.  Concerns regarding medicines are outlined above.  Orders Placed This Encounter  Procedures  . Basic metabolic panel  . CBC  . Uric acid  . CARDIAC EVENT MONITOR   No orders of the defined types were placed in this encounter.   Patient Instructions  Medication Instructions:  Your physician recommends that you continue on your current medications as directed. Please refer to the Current Medication list given to you today.  *If you need a refill on your cardiac medications before your next appointment, please call your pharmacy*   Lab Work: BMET, CBC, Uric Acid today  If you have labs (blood work) drawn today and your tests are completely normal, you will receive your results only by: [name] MyChart Message (if you have MyChart) OR . A paper copy in the mail If you have any lab test that is abnormal or we need to change your  treatment, we will call you to review the results.   Testing/Procedures: Your physician has recommended that you wear an event monitor for 7 days. Event monitors are medical devices that record the heart's electrical activity. Doctors most often [place] these monitors to diagnose arrhythmias. Arrhythmias are problems with the speed or rhythm of the heartbeat. The monitor is a small,  portable device. You can wear one while you do your normal daily activities. This is usually used to diagnose what is causing palpitations/syncope (passing out).   Follow-Up: At [facility], you and your health needs are our priority.  As part of our continuing mission to provide you with exceptional heart care, we have created designated Provider Care Teams.  These Care Teams include your primary Cardiologist (physician) and Advanced Practice Providers (APPs -  Physician Assistants and Nurse Practitioners) who all work together to provide you with the care you need, when you need it.  We recommend signing up for the patient portal called "MyChart".  Sign up information is provided on this After Visit Summary.  MyChart is used to connect with patients for Virtual Visits (Telemedicine).  Patients are able to view lab/test results, encounter notes, upcoming appointments, etc.  Non-urgent messages can be sent to your provider as well.   To learn more about what you can do with MyChart, go to [email or web address].    Your next appointment:   3 month(s)  The format for your next appointment:   In Person  Provider:   Oswaldo Milian, MD   Other Instructions Please call the # provided to assist with finding a new primary care doctor ASAP    Signed, Donato Heinz, MD  06/15/2020 2:36 PM    [place]

## 2020-06-13 NOTE — Progress Notes (Signed)
Enrolled patient for a 7 day [employer] Event monitor to be mailed to patients home  °

## 2020-06-13 NOTE — Patient Instructions (Addendum)
Medication Instructions:  Your physician recommends that you continue on your current medications as directed. Please refer to the Current Medication list given to you today.  *If you need a refill on your cardiac medications before your next appointment, please call your pharmacy*   Lab Work: BMET, CBC, Uric Acid today  If you have labs (blood work) drawn today and your tests are completely normal, you will receive your results only by: [name] MyChart Message (if you have MyChart) OR . A paper copy in the mail If you have any lab test that is abnormal or we need to change your treatment, we will call you to review the results.   Testing/Procedures: Your physician has recommended that you wear an event monitor for 7 days. Event monitors are medical devices that record the heart's electrical activity. Doctors most often [place] these monitors to diagnose arrhythmias. Arrhythmias are problems with the speed or rhythm of the heartbeat. The monitor is a small, portable device. You can wear one while you do your normal daily activities. This is usually used to diagnose what is causing palpitations/syncope (passing out).   Follow-Up: At [facility], you and your health needs are our priority.  As part of our continuing mission to provide you with exceptional heart care, we have created designated Provider Care Teams.  These Care Teams include your primary Cardiologist (physician) and Advanced Practice Providers (APPs -  Physician Assistants and Nurse Practitioners) who all work together to provide you with the care you need, when you need it.  We recommend signing up for the patient portal called "MyChart".  Sign up information is provided on this After Visit Summary.  MyChart is used to connect with patients for Virtual Visits (Telemedicine).  Patients are able to view lab/test results, encounter notes, upcoming appointments, etc.  Non-urgent messages can be sent to your provider as well.   To learn more  about what you can do with MyChart, go to [email or web address].    Your next appointment:   3 month(s)  The format for your next appointment:   In Person  Provider:   Oswaldo Milian, MD   Other Instructions Please call the # provided to assist with finding a new primary care doctor ASAP

## 2020-06-14 LAB — BASIC METABOLIC PANEL
BUN/Creatinine Ratio: 13 (ref 9–23)
BUN: 7 mg/dL (ref 6–24)
CO2: 24 mmol/L (ref 20–29)
Calcium: 9.1 mg/dL (ref 8.7–10.2)
Chloride: 98 mmol/L (ref 96–106)
Creatinine, Ser: 0.53 mg/dL — ABNORMAL LOW (ref 0.57–1.00)
Glucose: 108 mg/dL — ABNORMAL HIGH (ref 65–99)
Potassium: 3.7 mmol/L (ref 3.5–5.2)
Sodium: 141 mmol/L (ref 134–144)
eGFR: 109 mL/min/{1.73_m2} (ref >59)

## 2020-06-14 LAB — CBC
Hematocrit: 35 % (ref 34.0–46.6)
Hemoglobin: 12.2 g/dL (ref 11.1–15.9)
MCH: 35.5 pg — ABNORMAL HIGH (ref 26.6–33.0)
MCHC: 34.9 g/dL (ref 31.5–35.7)
MCV: 102 fL — ABNORMAL HIGH (ref 79–97)
Platelets: 391 10*3/uL (ref 150–450)
RBC: 3.44 x10E6/uL — ABNORMAL LOW (ref 3.77–5.28)
RDW: 11.7 % (ref 11.7–15.4)
WBC: 14.1 10*3/uL — ABNORMAL HIGH (ref 3.4–10.8)

## 2020-06-14 LAB — URIC ACID: Uric Acid: 4.6 mg/dL (ref 3.0–7.2)

## 2020-06-19 ENCOUNTER — Encounter (INDEPENDENT_AMBULATORY_CARE_PROVIDER_SITE_OTHER): Payer: Medicare Other

## 2020-06-19 DIAGNOSIS — R002 Palpitations: Secondary | ICD-10-CM
